# Patient Record
Sex: Female | Born: 1950 | Race: White | Hispanic: No | Marital: Married | Smoking: Former smoker
Health system: Southern US, Community
[De-identification: ages and names within clinical notes are randomized; demographics above are authoritative.]

## PROBLEM LIST (undated history)

## (undated) DIAGNOSIS — F319 Bipolar disorder, unspecified: Secondary | ICD-10-CM

## (undated) DIAGNOSIS — I619 Nontraumatic intracerebral hemorrhage, unspecified: Secondary | ICD-10-CM

## (undated) DIAGNOSIS — J439 Emphysema, unspecified: Secondary | ICD-10-CM

## (undated) DIAGNOSIS — F419 Anxiety disorder, unspecified: Secondary | ICD-10-CM

## (undated) DIAGNOSIS — C50919 Malignant neoplasm of unspecified site of unspecified female breast: Secondary | ICD-10-CM

## (undated) DIAGNOSIS — E119 Type 2 diabetes mellitus without complications: Secondary | ICD-10-CM

## (undated) DIAGNOSIS — J40 Bronchitis, not specified as acute or chronic: Secondary | ICD-10-CM

## (undated) DIAGNOSIS — I10 Essential (primary) hypertension: Secondary | ICD-10-CM

## (undated) DIAGNOSIS — K118 Other diseases of salivary glands: Secondary | ICD-10-CM

## (undated) DIAGNOSIS — C3490 Malignant neoplasm of unspecified part of unspecified bronchus or lung: Secondary | ICD-10-CM

## (undated) DIAGNOSIS — M5030 Other cervical disc degeneration, unspecified cervical region: Secondary | ICD-10-CM

## (undated) DIAGNOSIS — K279 Peptic ulcer, site unspecified, unspecified as acute or chronic, without hemorrhage or perforation: Secondary | ICD-10-CM

## (undated) DIAGNOSIS — I2510 Atherosclerotic heart disease of native coronary artery without angina pectoris: Secondary | ICD-10-CM

## (undated) DIAGNOSIS — R251 Tremor, unspecified: Secondary | ICD-10-CM

## (undated) HISTORY — PX: KNEE ARTHROSCOPY: SUR90

## (undated) HISTORY — PX: CORONARY ANGIOPLASTY WITH STENT PLACEMENT: SHX49

## (undated) HISTORY — PX: BREAST LUMPECTOMY: SHX2

---

## 2010-03-26 ENCOUNTER — Emergency Department (HOSPITAL_BASED_OUTPATIENT_CLINIC_OR_DEPARTMENT_OTHER)
Admission: EM | Admit: 2010-03-26 | Discharge: 2010-03-26 | Payer: Self-pay | Source: Home / Self Care | Admitting: Emergency Medicine

## 2010-06-09 LAB — DIFFERENTIAL
Basophils Relative: 0 % (ref 0–1)
Eosinophils Absolute: 0.2 10*3/uL (ref 0.0–0.7)
Eosinophils Relative: 2 % (ref 0–5)
Lymphs Abs: 2.3 10*3/uL (ref 0.7–4.0)
Monocytes Absolute: 0.8 10*3/uL (ref 0.1–1.0)
Monocytes Relative: 9 % (ref 3–12)

## 2010-06-09 LAB — BASIC METABOLIC PANEL
CO2: 23 mEq/L (ref 19–32)
Calcium: 9.9 mg/dL (ref 8.4–10.5)
Creatinine, Ser: 0.6 mg/dL (ref 0.4–1.2)
Sodium: 143 mEq/L (ref 135–145)

## 2010-06-09 LAB — URINALYSIS, ROUTINE W REFLEX MICROSCOPIC
Bilirubin Urine: NEGATIVE
Ketones, ur: NEGATIVE mg/dL
pH: 5.5 (ref 5.0–8.0)

## 2010-06-09 LAB — CBC
HCT: 48.6 % — ABNORMAL HIGH (ref 36.0–46.0)
Hemoglobin: 16.2 g/dL — ABNORMAL HIGH (ref 12.0–15.0)
MCH: 28.5 pg (ref 26.0–34.0)
MCV: 85.6 fL (ref 78.0–100.0)
Platelets: 367 10*3/uL (ref 150–400)
RBC: 5.68 MIL/uL — ABNORMAL HIGH (ref 3.87–5.11)

## 2010-06-09 LAB — POCT B-TYPE NATRIURETIC PEPTIDE (BNP): B Natriuretic Peptide, POC: 5.9 pg/mL (ref 0–100)

## 2010-06-09 LAB — POCT CARDIAC MARKERS
CKMB, poc: <1 ng/mL — ABNORMAL LOW (ref 1.0–8.0)
Myoglobin, poc: 69.8 ng/mL (ref 12–200)

## 2010-06-09 LAB — URINE MICROSCOPIC-ADD ON

## 2010-07-17 ENCOUNTER — Emergency Department (HOSPITAL_BASED_OUTPATIENT_CLINIC_OR_DEPARTMENT_OTHER)
Admission: EM | Admit: 2010-07-17 | Discharge: 2010-07-17 | Disposition: A | Discharge status: Home / Self Care | Payer: Federal, State, Local not specified - PPO | Attending: Emergency Medicine | Admitting: Emergency Medicine

## 2010-07-17 DIAGNOSIS — H81399 Other peripheral vertigo, unspecified ear: Secondary | ICD-10-CM | POA: Insufficient documentation

## 2010-07-17 DIAGNOSIS — Z79899 Other long term (current) drug therapy: Secondary | ICD-10-CM | POA: Insufficient documentation

## 2010-07-17 DIAGNOSIS — J45909 Unspecified asthma, uncomplicated: Secondary | ICD-10-CM | POA: Insufficient documentation

## 2010-07-17 DIAGNOSIS — I10 Essential (primary) hypertension: Secondary | ICD-10-CM | POA: Insufficient documentation

## 2011-08-23 IMAGING — CR DG CHEST 2V
2 series · 2 of 2 positions shown · non-contrast
Comparison: None.

CLINICAL DATA: Cough, shortness of breath, COPD, smoker

CHEST - 2 VIEW

[w chest pa]
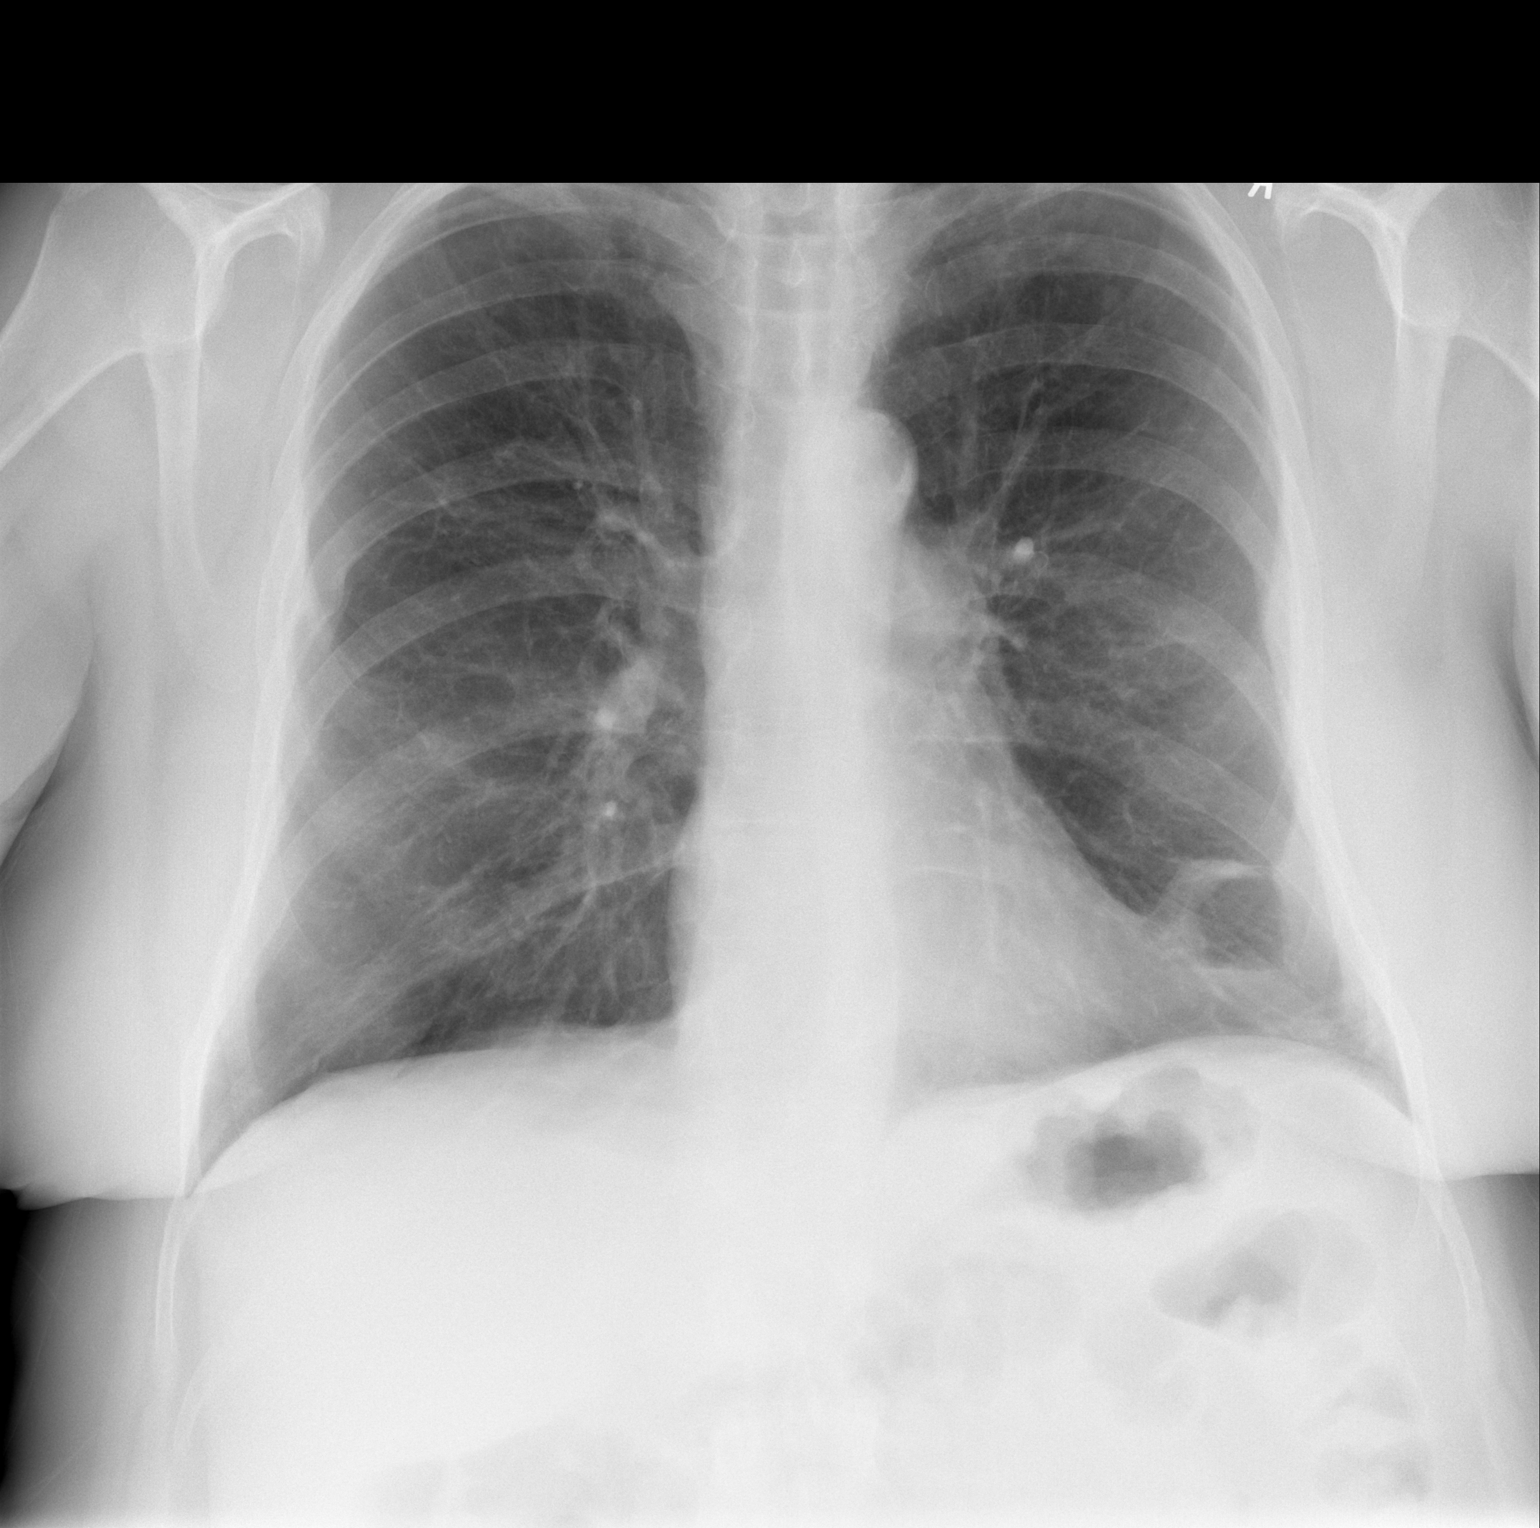

[w chest lat]
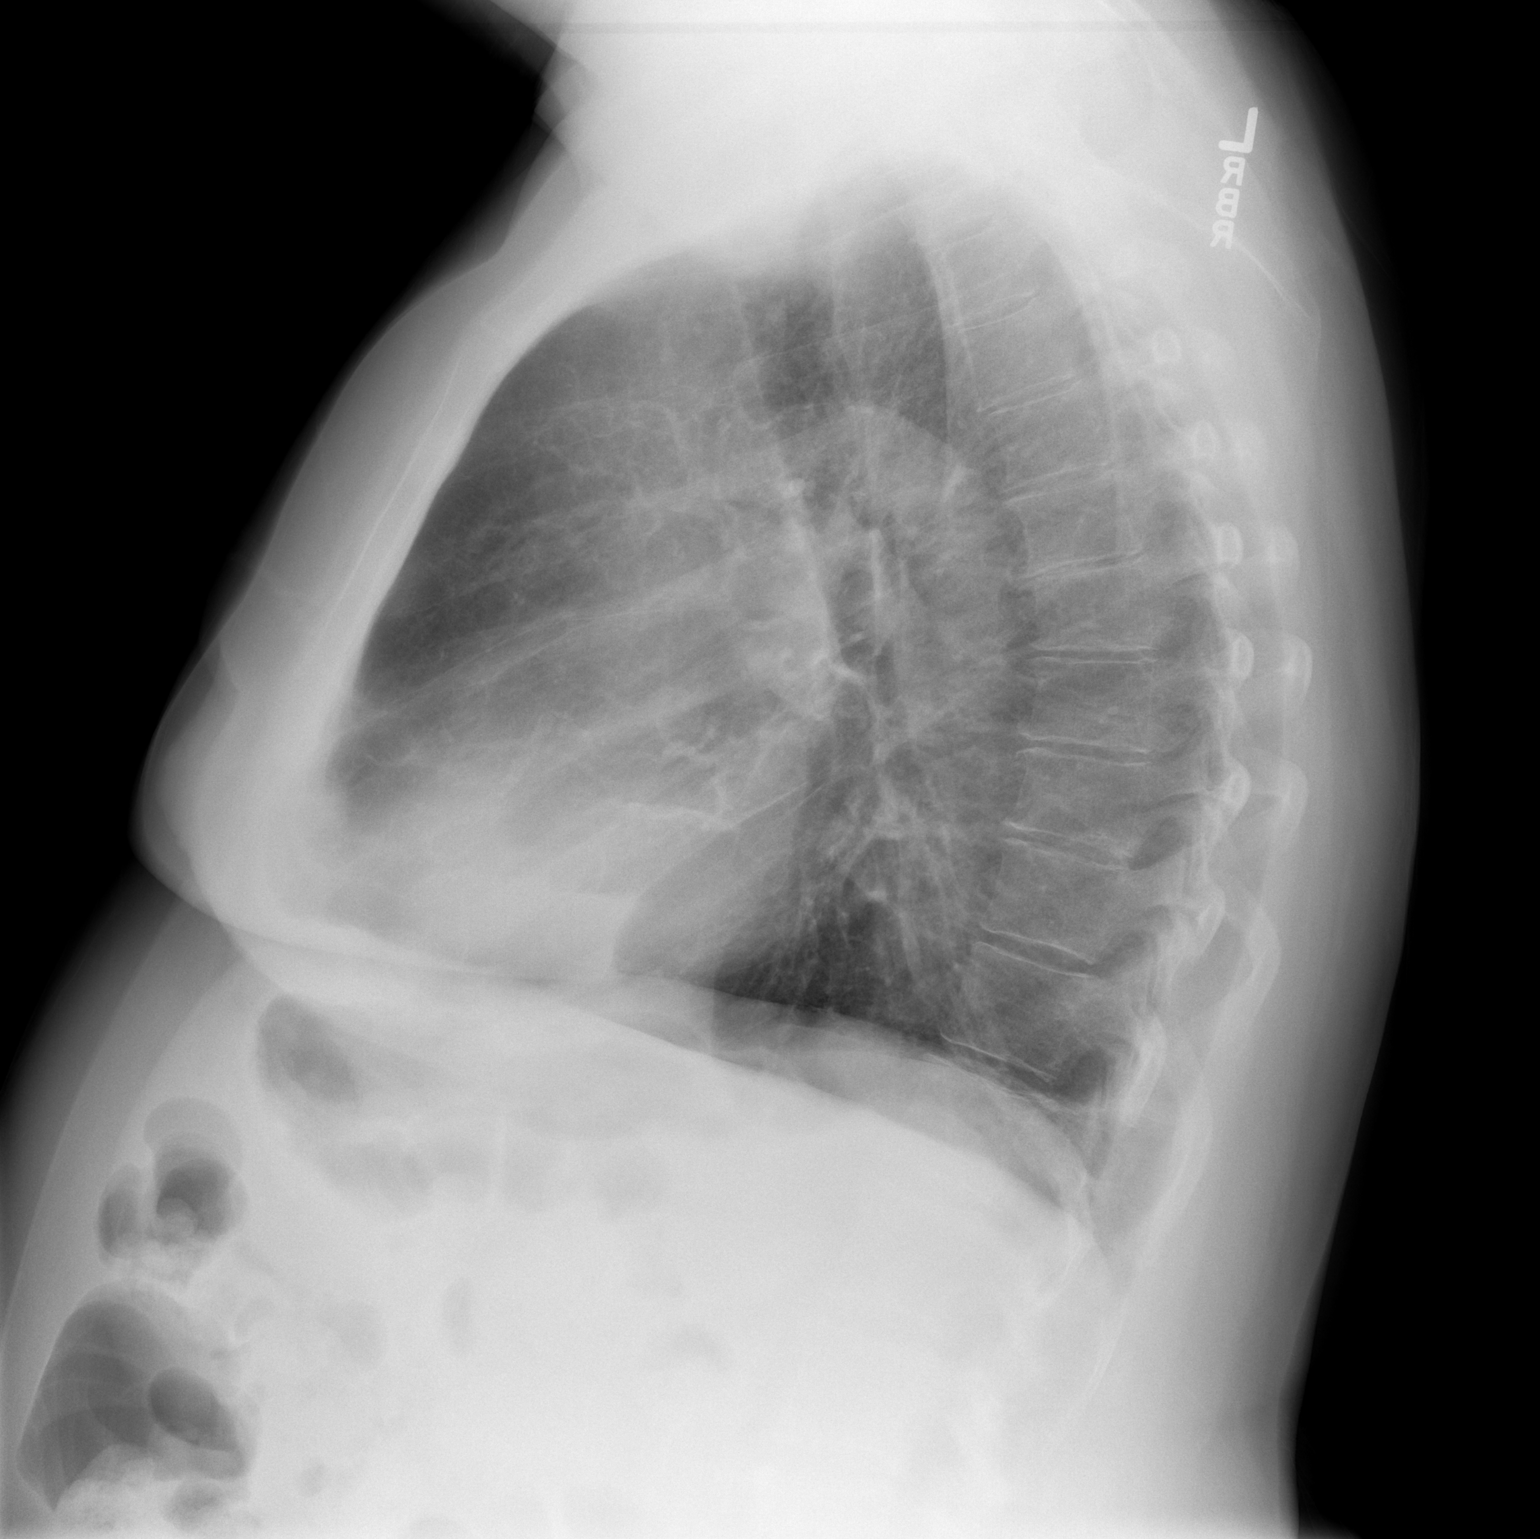

[2 of 2 positions shown; findings below may reference images not displayed]

FINDINGS: There is lingular opacity and small left pleural
effusion.  The right lung is clear.  The cardiac silhouette,
mediastinum, pulmonary vasculature are within normal limits.  Old
right rib fractures are noted.
IMPRESSION: Lingular infiltrate and effusion.

## 2014-03-30 HISTORY — PX: GASTRIC BYPASS: SHX52

## 2019-05-02 ENCOUNTER — Ambulatory Visit: Payer: TRICARE For Life (TFL) | Attending: Internal Medicine

## 2021-07-28 HISTORY — PX: REPAIR OF PERFORATED ULCER: SHX6065

## 2021-12-20 ENCOUNTER — Emergency Department (HOSPITAL_COMMUNITY): Payer: Medicare Other

## 2021-12-20 ENCOUNTER — Encounter (HOSPITAL_COMMUNITY): Payer: Self-pay

## 2021-12-20 ENCOUNTER — Other Ambulatory Visit: Payer: Self-pay

## 2021-12-20 ENCOUNTER — Inpatient Hospital Stay (HOSPITAL_COMMUNITY)
Admission: EM | Admit: 2021-12-20 | Discharge: 2021-12-25 | DRG: 064 | Disposition: A | Discharge status: Rehabilitation Facility | Payer: Medicare Other | Attending: Family Medicine | Admitting: Family Medicine

## 2021-12-20 ENCOUNTER — Inpatient Hospital Stay (HOSPITAL_COMMUNITY): Payer: Medicare Other

## 2021-12-20 DIAGNOSIS — K255 Chronic or unspecified gastric ulcer with perforation: Secondary | ICD-10-CM | POA: Diagnosis present

## 2021-12-20 DIAGNOSIS — Z923 Personal history of irradiation: Secondary | ICD-10-CM | POA: Diagnosis not present

## 2021-12-20 DIAGNOSIS — Z8249 Family history of ischemic heart disease and other diseases of the circulatory system: Secondary | ICD-10-CM

## 2021-12-20 DIAGNOSIS — R251 Tremor, unspecified: Secondary | ICD-10-CM | POA: Diagnosis present

## 2021-12-20 DIAGNOSIS — I69354 Hemiplegia and hemiparesis following cerebral infarction affecting left non-dominant side: Secondary | ICD-10-CM | POA: Diagnosis present

## 2021-12-20 DIAGNOSIS — R1310 Dysphagia, unspecified: Secondary | ICD-10-CM | POA: Diagnosis present

## 2021-12-20 DIAGNOSIS — E7800 Pure hypercholesterolemia, unspecified: Secondary | ICD-10-CM | POA: Diagnosis present

## 2021-12-20 DIAGNOSIS — Z681 Body mass index (BMI) 19 or less, adult: Secondary | ICD-10-CM

## 2021-12-20 DIAGNOSIS — I10 Essential (primary) hypertension: Secondary | ICD-10-CM | POA: Diagnosis present

## 2021-12-20 DIAGNOSIS — Z91040 Latex allergy status: Secondary | ICD-10-CM

## 2021-12-20 DIAGNOSIS — G4733 Obstructive sleep apnea (adult) (pediatric): Secondary | ICD-10-CM | POA: Diagnosis present

## 2021-12-20 DIAGNOSIS — Z9884 Bariatric surgery status: Secondary | ICD-10-CM | POA: Diagnosis not present

## 2021-12-20 DIAGNOSIS — G8194 Hemiplegia, unspecified affecting left nondominant side: Secondary | ICD-10-CM | POA: Diagnosis present

## 2021-12-20 DIAGNOSIS — C3412 Malignant neoplasm of upper lobe, left bronchus or lung: Secondary | ICD-10-CM | POA: Diagnosis present

## 2021-12-20 DIAGNOSIS — R296 Repeated falls: Secondary | ICD-10-CM | POA: Diagnosis present

## 2021-12-20 DIAGNOSIS — R54 Age-related physical debility: Secondary | ICD-10-CM | POA: Diagnosis present

## 2021-12-20 DIAGNOSIS — R270 Ataxia, unspecified: Secondary | ICD-10-CM | POA: Diagnosis present

## 2021-12-20 DIAGNOSIS — W19XXXA Unspecified fall, initial encounter: Secondary | ICD-10-CM | POA: Diagnosis not present

## 2021-12-20 DIAGNOSIS — Z833 Family history of diabetes mellitus: Secondary | ICD-10-CM

## 2021-12-20 DIAGNOSIS — D75839 Thrombocytosis, unspecified: Secondary | ICD-10-CM | POA: Diagnosis present

## 2021-12-20 DIAGNOSIS — I63511 Cerebral infarction due to unspecified occlusion or stenosis of right middle cerebral artery: Principal | ICD-10-CM | POA: Diagnosis present

## 2021-12-20 DIAGNOSIS — I69141 Monoplegia of lower limb following nontraumatic intracerebral hemorrhage affecting right dominant side: Secondary | ICD-10-CM | POA: Diagnosis not present

## 2021-12-20 DIAGNOSIS — Z888 Allergy status to other drugs, medicaments and biological substances status: Secondary | ICD-10-CM

## 2021-12-20 DIAGNOSIS — G9389 Other specified disorders of brain: Secondary | ICD-10-CM | POA: Diagnosis present

## 2021-12-20 DIAGNOSIS — F319 Bipolar disorder, unspecified: Secondary | ICD-10-CM | POA: Diagnosis present

## 2021-12-20 DIAGNOSIS — G936 Cerebral edema: Secondary | ICD-10-CM | POA: Diagnosis present

## 2021-12-20 DIAGNOSIS — E119 Type 2 diabetes mellitus without complications: Secondary | ICD-10-CM | POA: Diagnosis present

## 2021-12-20 DIAGNOSIS — F909 Attention-deficit hyperactivity disorder, unspecified type: Secondary | ICD-10-CM | POA: Diagnosis present

## 2021-12-20 DIAGNOSIS — I2510 Atherosclerotic heart disease of native coronary artery without angina pectoris: Secondary | ICD-10-CM | POA: Diagnosis present

## 2021-12-20 DIAGNOSIS — Z853 Personal history of malignant neoplasm of breast: Secondary | ICD-10-CM

## 2021-12-20 DIAGNOSIS — R29810 Facial weakness: Secondary | ICD-10-CM | POA: Diagnosis present

## 2021-12-20 DIAGNOSIS — J439 Emphysema, unspecified: Secondary | ICD-10-CM | POA: Diagnosis present

## 2021-12-20 DIAGNOSIS — F419 Anxiety disorder, unspecified: Secondary | ICD-10-CM | POA: Diagnosis present

## 2021-12-20 DIAGNOSIS — R64 Cachexia: Secondary | ICD-10-CM | POA: Diagnosis present

## 2021-12-20 DIAGNOSIS — G935 Compression of brain: Secondary | ICD-10-CM | POA: Diagnosis present

## 2021-12-20 DIAGNOSIS — U071 COVID-19: Secondary | ICD-10-CM | POA: Diagnosis present

## 2021-12-20 DIAGNOSIS — I63311 Cerebral infarction due to thrombosis of right middle cerebral artery: Secondary | ICD-10-CM | POA: Diagnosis not present

## 2021-12-20 DIAGNOSIS — I639 Cerebral infarction, unspecified: Secondary | ICD-10-CM | POA: Diagnosis present

## 2021-12-20 DIAGNOSIS — J3489 Other specified disorders of nose and nasal sinuses: Secondary | ICD-10-CM | POA: Diagnosis not present

## 2021-12-20 DIAGNOSIS — M5030 Other cervical disc degeneration, unspecified cervical region: Secondary | ICD-10-CM | POA: Diagnosis present

## 2021-12-20 DIAGNOSIS — Z955 Presence of coronary angioplasty implant and graft: Secondary | ICD-10-CM | POA: Diagnosis not present

## 2021-12-20 DIAGNOSIS — Z85118 Personal history of other malignant neoplasm of bronchus and lung: Secondary | ICD-10-CM

## 2021-12-20 DIAGNOSIS — Z79899 Other long term (current) drug therapy: Secondary | ICD-10-CM

## 2021-12-20 DIAGNOSIS — F411 Generalized anxiety disorder: Secondary | ICD-10-CM | POA: Diagnosis present

## 2021-12-20 DIAGNOSIS — R471 Dysarthria and anarthria: Secondary | ICD-10-CM | POA: Diagnosis present

## 2021-12-20 DIAGNOSIS — F17210 Nicotine dependence, cigarettes, uncomplicated: Secondary | ICD-10-CM | POA: Diagnosis present

## 2021-12-20 DIAGNOSIS — W06XXXA Fall from bed, initial encounter: Secondary | ICD-10-CM | POA: Diagnosis present

## 2021-12-20 DIAGNOSIS — R29711 NIHSS score 11: Secondary | ICD-10-CM | POA: Diagnosis present

## 2021-12-20 DIAGNOSIS — Z7982 Long term (current) use of aspirin: Secondary | ICD-10-CM

## 2021-12-20 DIAGNOSIS — I6389 Other cerebral infarction: Secondary | ICD-10-CM | POA: Diagnosis not present

## 2021-12-20 DIAGNOSIS — I25810 Atherosclerosis of coronary artery bypass graft(s) without angina pectoris: Secondary | ICD-10-CM | POA: Diagnosis not present

## 2021-12-20 DIAGNOSIS — H518 Other specified disorders of binocular movement: Secondary | ICD-10-CM | POA: Diagnosis present

## 2021-12-20 DIAGNOSIS — R590 Localized enlarged lymph nodes: Secondary | ICD-10-CM | POA: Diagnosis present

## 2021-12-20 DIAGNOSIS — R58 Hemorrhage, not elsewhere classified: Secondary | ICD-10-CM | POA: Diagnosis present

## 2021-12-20 DIAGNOSIS — K219 Gastro-esophageal reflux disease without esophagitis: Secondary | ICD-10-CM | POA: Diagnosis present

## 2021-12-20 DIAGNOSIS — F900 Attention-deficit hyperactivity disorder, predominantly inattentive type: Secondary | ICD-10-CM | POA: Diagnosis present

## 2021-12-20 DIAGNOSIS — R414 Neurologic neglect syndrome: Secondary | ICD-10-CM | POA: Diagnosis present

## 2021-12-20 DIAGNOSIS — R531 Weakness: Secondary | ICD-10-CM

## 2021-12-20 DIAGNOSIS — Z8659 Personal history of other mental and behavioral disorders: Secondary | ICD-10-CM | POA: Diagnosis not present

## 2021-12-20 HISTORY — DX: Malignant neoplasm of unspecified site of unspecified female breast: C50.919

## 2021-12-20 HISTORY — DX: Tremor, unspecified: R25.1

## 2021-12-20 HISTORY — DX: Malignant neoplasm of unspecified part of unspecified bronchus or lung: C34.90

## 2021-12-20 HISTORY — DX: Emphysema, unspecified: J43.9

## 2021-12-20 HISTORY — DX: Type 2 diabetes mellitus without complications: E11.9

## 2021-12-20 HISTORY — DX: Essential (primary) hypertension: I10

## 2021-12-20 HISTORY — DX: Bipolar disorder, unspecified: F31.9

## 2021-12-20 LAB — I-STAT CHEM 8, ED
BUN: 8 mg/dL (ref 8–23)
Calcium, Ion: 0.97 mmol/L — ABNORMAL LOW (ref 1.15–1.40)
Chloride: 106 mmol/L (ref 98–111)
Creatinine, Ser: 0.5 mg/dL (ref 0.44–1.00)
Glucose, Bld: 95 mg/dL (ref 70–99)
HCT: 45 % (ref 36.0–46.0)
Hemoglobin: 15.3 g/dL — ABNORMAL HIGH (ref 12.0–15.0)
Potassium: 3.9 mmol/L (ref 3.5–5.1)
Sodium: 140 mmol/L (ref 135–145)
TCO2: 28 mmol/L (ref 22–32)

## 2021-12-20 LAB — HEMOGLOBIN A1C
Hgb A1c MFr Bld: 5.8 % — ABNORMAL HIGH (ref 4.8–5.6)
Mean Plasma Glucose: 119.76 mg/dL

## 2021-12-20 LAB — COMPREHENSIVE METABOLIC PANEL
ALT: 23 U/L (ref 0–44)
AST: 26 U/L (ref 15–41)
Albumin: 3.1 g/dL — ABNORMAL LOW (ref 3.5–5.0)
Alkaline Phosphatase: 84 U/L (ref 38–126)
Anion gap: 9 (ref 5–15)
BUN: 6 mg/dL — ABNORMAL LOW (ref 8–23)
CO2: 25 mmol/L (ref 22–32)
Calcium: 8.7 mg/dL — ABNORMAL LOW (ref 8.9–10.3)
Chloride: 107 mmol/L (ref 98–111)
Creatinine, Ser: 0.54 mg/dL (ref 0.44–1.00)
GFR, Estimated: >60 mL/min (ref >60)
Glucose, Bld: 91 mg/dL (ref 70–99)
Potassium: 4 mmol/L (ref 3.5–5.1)
Sodium: 141 mmol/L (ref 135–145)
Total Bilirubin: 0.8 mg/dL (ref 0.3–1.2)
Total Protein: 6.2 g/dL — ABNORMAL LOW (ref 6.5–8.1)

## 2021-12-20 LAB — DIFFERENTIAL
Abs Immature Granulocytes: 0.02 10*3/uL (ref 0.00–0.07)
Basophils Absolute: 0 10*3/uL (ref 0.0–0.1)
Basophils Relative: 0 %
Eosinophils Absolute: 0 10*3/uL (ref 0.0–0.5)
Eosinophils Relative: 1 %
Immature Granulocytes: 0 %
Lymphocytes Relative: 27 %
Lymphs Abs: 1.7 10*3/uL (ref 0.7–4.0)
Monocytes Absolute: 0.5 10*3/uL (ref 0.1–1.0)
Monocytes Relative: 8 %
Neutro Abs: 4.2 10*3/uL (ref 1.7–7.7)
Neutrophils Relative %: 64 %

## 2021-12-20 LAB — ECHOCARDIOGRAM COMPLETE
Area-P 1/2: 3.48 cm2
Calc EF: 51 %
Height: 60 in
S' Lateral: 2.8 cm
Single Plane A2C EF: 48.1 %
Single Plane A4C EF: 51.3 %
Weight: 1552.04 oz

## 2021-12-20 LAB — APTT: aPTT: 26 seconds (ref 24–36)

## 2021-12-20 LAB — PROTIME-INR
INR: 0.9 (ref 0.8–1.2)
Prothrombin Time: 12.5 seconds (ref 11.4–15.2)

## 2021-12-20 LAB — CBC
HCT: 45.1 % (ref 36.0–46.0)
Hemoglobin: 14.6 g/dL (ref 12.0–15.0)
MCH: 29.1 pg (ref 26.0–34.0)
MCHC: 32.4 g/dL (ref 30.0–36.0)
MCV: 89.8 fL (ref 80.0–100.0)
Platelets: 258 10*3/uL (ref 150–400)
RBC: 5.02 MIL/uL (ref 3.87–5.11)
RDW: 14 % (ref 11.5–15.5)
WBC: 6.5 10*3/uL (ref 4.0–10.5)
nRBC: 0 % (ref 0.0–0.2)

## 2021-12-20 LAB — ETHANOL: Alcohol, Ethyl (B): <10 mg/dL (ref <10)

## 2021-12-20 LAB — SODIUM
Sodium: 144 mmol/L (ref 135–145)
Sodium: 142 mmol/L (ref 135–145)

## 2021-12-20 LAB — CBG MONITORING, ED: Glucose-Capillary: 106 mg/dL — ABNORMAL HIGH (ref 70–99)

## 2021-12-20 LAB — HIV ANTIBODY (ROUTINE TESTING W REFLEX): HIV Screen 4th Generation wRfx: NONREACTIVE

## 2021-12-20 MED ORDER — ACETAMINOPHEN 325 MG PO TABS
650.0000 mg | ORAL_TABLET | ORAL | Status: DC | PRN
  Administered 2021-12-21 – 2021-12-25 (×8): 650 mg via ORAL
  Filled 2021-12-20 (×9): qty 2

## 2021-12-20 MED ORDER — PANTOPRAZOLE SODIUM 40 MG IV SOLR
40.0000 mg | Freq: Every day | INTRAVENOUS | Status: DC
  Administered 2021-12-20 – 2021-12-21 (×2): 40 mg via INTRAVENOUS
  Filled 2021-12-20: qty 10

## 2021-12-20 MED ORDER — STROKE: EARLY STAGES OF RECOVERY BOOK
Freq: Once | Status: AC
  Filled 2021-12-20: qty 1

## 2021-12-20 MED ORDER — REVEFENACIN 175 MCG/3ML IN SOLN
175.0000 ug | Freq: Every day | RESPIRATORY_TRACT | Status: DC
  Administered 2021-12-21 – 2021-12-25 (×5): 175 ug via RESPIRATORY_TRACT
  Filled 2021-12-20 (×6): qty 3

## 2021-12-20 MED ORDER — ORAL CARE MOUTH RINSE: 15.0000 mL | OROMUCOSAL | Status: DC | PRN

## 2021-12-20 MED ORDER — CHLORHEXIDINE GLUCONATE CLOTH 2 % EX PADS
6.0000 | MEDICATED_PAD | Freq: Every day | CUTANEOUS | Status: DC
  Administered 2021-12-20 – 2021-12-25 (×6): 6 via TOPICAL

## 2021-12-20 MED ORDER — SODIUM CHLORIDE 3 % IV SOLN
INTRAVENOUS | Status: AC
  Filled 2021-12-20 (×3): qty 500

## 2021-12-20 MED ORDER — SENNOSIDES-DOCUSATE SODIUM 8.6-50 MG PO TABS: 1.0000 | ORAL_TABLET | Freq: Every evening | ORAL | Status: DC | PRN

## 2021-12-20 MED ORDER — SODIUM CHLORIDE 0.9% FLUSH: 3.0000 mL | Freq: Once | INTRAVENOUS | Status: DC

## 2021-12-20 MED ORDER — ACETAMINOPHEN 160 MG/5ML PO SOLN: 650.0000 mg | ORAL | Status: DC | PRN

## 2021-12-20 MED ORDER — ARFORMOTEROL TARTRATE 15 MCG/2ML IN NEBU
15.0000 ug | INHALATION_SOLUTION | Freq: Two times a day (BID) | RESPIRATORY_TRACT | Status: DC
  Administered 2021-12-20 – 2021-12-25 (×10): 15 ug via RESPIRATORY_TRACT
  Filled 2021-12-20 (×10): qty 2

## 2021-12-20 MED ORDER — ACETAMINOPHEN 650 MG RE SUPP
650.0000 mg | RECTAL | Status: DC | PRN
  Administered 2021-12-20: 650 mg via RECTAL
  Filled 2021-12-20: qty 1

## 2021-12-20 MED ORDER — IOHEXOL 350 MG/ML SOLN
75.0000 mL | Freq: Once | INTRAVENOUS | Status: AC | PRN
  Administered 2021-12-20: 75 mL via INTRAVENOUS

## 2021-12-20 NOTE — Progress Notes (Signed)
Patient tested positive for Covid per home test on Sunday 12/14/21. Patient/family does not have copy of positive result.    Husband was sick prior to patient.

## 2021-12-20 NOTE — ED Provider Notes (Signed)
[facility] EMERGENCY DEPARTMENT Provider Note  CSN: [id number] Arrival date & time: 12/20/21 0751  Chief Complaint(s) Code Stroke  HPI Lauren [name] is a 71 y.o. female with history of diabetes, hypertension presenting to the emergency department with weakness.  Per EMS, patient had went to sleep normal last night at 8 PM.  This morning, patient woke up around 7 AM and tried to get out of bed and fell, collapsing as she could not move her left side.  EMS was called, brought the patient to the emergency department and activated a code stroke.  Patient denies any pain but reports she cannot move her left side.  Denies any other recent symptoms such as chest pain, abdominal pain.  Was placed in a c-collar by EMS given fall.   Past Medical History Past Medical History:  Diagnosis Date   Diabetes mellitus without complication ([place])    Hypertension    Patient Active Problem List   Diagnosis Date Noted   Stroke ([place]) 12/20/2021   Home Medication(s) Prior to Admission medications   Not on File                                                                                                                                    Past Surgical History History reviewed. No pertinent surgical history. Family History No family history on file.  Social History   Current smoker Allergies Fluoxetine and Latex  Review of Systems Review of Systems  Unable to perform ROS: Acuity of condition    Physical Exam Vital Signs  I have reviewed the triage vital signs BP 122/85 Comment: 2L [place]  Pulse 88 Comment: 2L [place]  Temp 98 F (36.7 C) (Oral)   Resp (!) 28 Comment: 2L [place]  Ht 5' (1.524 m)   Wt 44 kg   SpO2 95% Comment: 2L [place]  BMI 18.94 kg/m  Physical Exam Vitals and nursing note reviewed.  Constitutional:      General: She is not in acute distress.    Appearance: She is well-developed.  HENT:     Head: Normocephalic and atraumatic.     Mouth/Throat:     Mouth: Mucous  membranes are moist.     Comments: Dried blood around mouth, no obvious oral lesion Eyes:     Pupils: Pupils are equal, round, and reactive to light.  Neck:     Comments: Collar in place Cardiovascular:     Rate and Rhythm: Normal rate and regular rhythm.     Heart sounds: No murmur heard. Pulmonary:     Effort: Pulmonary effort is normal. No respiratory distress.     Breath sounds: Normal breath sounds.  Abdominal:     General: Abdomen is flat.     Palpations: Abdomen is soft.     Tenderness: There is no abdominal tenderness.  Musculoskeletal:        General: No tenderness.  Right lower leg: No edema.     Left lower leg: No edema.  Skin:    General: Skin is warm and dry.  Neurological:     Mental Status: She is alert.     Comments: Left-sided facial droop with rightward gaze preference, does not blink to confrontation and left visual field.  Slurred speech.  Strength 2 out of 5 in the left upper extremity and 1 out of 5 in the left lower extremity.  Strength normal in the right upper and lower extremity.  Psychiatric:        Mood and Affect: Mood normal.        Behavior: Behavior normal.     ED Results and Treatments Labs (all labs ordered are listed, but only abnormal results are displayed) Labs Reviewed  COMPREHENSIVE METABOLIC PANEL - Abnormal; Notable for the following components:      Result Value   BUN 6 (*)    Calcium 8.7 (*)    Total Protein 6.2 (*)    Albumin 3.1 (*)    All other components within normal limits  I-STAT CHEM 8, ED - Abnormal; Notable for the following components:   Calcium, Ion 0.97 (*)    Hemoglobin 15.3 (*)    All other components within normal limits  CBG MONITORING, ED - Abnormal; Notable for the following components:   Glucose-Capillary 106 (*)    All other components within normal limits  PROTIME-INR  APTT  CBC  DIFFERENTIAL  ETHANOL  SODIUM  SODIUM  SODIUM  HIV ANTIBODY (ROUTINE TESTING W REFLEX)  HEMOGLOBIN A1C                                                                                                                           Radiology CT Cervical Spine Wo Contrast  Result Date: 12/20/2021 CLINICAL DATA:  71 year old female code stroke presentation. EXAM: CT CERVICAL SPINE WITH CONTRAST TECHNIQUE: Multiplanar CT images of the cervical spine were reconstructed from contemporary CTA of the Neck. RADIATION DOSE REDUCTION: This exam was performed according to the departmental dose-optimization program which includes automated exposure control, adjustment of the mA and/or kV according to patient size and/or use of iterative reconstruction technique. CONTRAST:  No additional COMPARISON:  CTA head and neck today reported separately. Cervical spine CT 11/17/2018. FINDINGS: Alignment: Chronic straightening of lower cervical lordosis. Cervicothoracic junction alignment is within normal limits. Bilateral posterior element alignment is within normal limits. Skull base and vertebrae: Visualized skull base is intact. No atlanto-occipital dissociation. Healed left anterior C1 ring fracture since 2020. Maintained C1-C2 alignment. Previously seen left C6 and C7 superior articulating facet fractures have healed (series 3, image 64). No acute osseous abnormality identified. Soft tissues and spinal canal: No prevertebral fluid or swelling. No visible canal hematoma. Neck CTA findings reported separately. Disc levels: Intermittent cervical disc and endplate degeneration appears stable since 2020. Upper chest: Reported separately. IMPRESSION: 1. No acute osseous abnormality identified in the cervical spine. Healed left  C1 ring, left C6 and C7 posterior element fractures since 2020. 2. CTA head and neck today reported separately. Electronically Signed   By: Genevie Ann M.D.   On: 12/20/2021 08:56   CT ANGIO HEAD NECK W WO CM W PERF (CODE STROKE)  Result Date: 12/20/2021 CLINICAL DATA:  Code stroke. 71 year old female. Known indolent left upper  lobe lung carcinoma. EXAM: CT ANGIOGRAPHY HEAD AND NECK CT PERFUSION BRAIN TECHNIQUE: Multidetector CT imaging of the head and neck was performed using the standard protocol during bolus administration of intravenous contrast. Multiplanar CT image reconstructions and MIPs were obtained to evaluate the vascular anatomy. Carotid stenosis measurements (when applicable) are obtained utilizing NASCET criteria, using the distal internal carotid diameter as the denominator. Multiphase CT imaging of the brain was performed following IV bolus contrast injection. Subsequent parametric perfusion maps were calculated using RAPID software. RADIATION DOSE REDUCTION: This exam was performed according to the departmental dose-optimization program which includes automated exposure control, adjustment of the mA and/or kV according to patient size and/or use of iterative reconstruction technique. CONTRAST:  71mL OMNIPAQUE IOHEXOL 350 MG/ML SOLN COMPARISON:  Plain head CT 0805 hours today Chest CT 07/30/2021. FINDINGS: CT Brain Perfusion Findings: ASPECTS: 5 CBF (<30%) Volume: 0 by the standard criteria, and no CBV alterations, suggesting subacute cytotoxic edema on the plain head CT. Perfusion (Tmax>6.0s) volume: 94mL by the standard criteria, up to 54 mL using T-max greater than 4 S. Mismatch Volume: Difficult to determine considering pseudonormalization of CBF and CBV. Suspect little salvageable penumbra given this constellation. Infarction Location:Right MCA CTA NECK Skeleton: Largely absent dentition. Mild mandible motion artifact. Mild cervical spine degeneration for age. No acute osseous abnormality identified. Upper chest: Emphysema and apical lung scarring. Known left upper lobe lung cancer is 12 mm on series 5, image 151, stable since 07/30/2021. Other neck: No acute finding. Aortic arch: Calcified aortic atherosclerosis. Entire arch not included. Right carotid system: Visible brachiocephalic artery with only mild plaque.  Negative right CCA origin. Mild right CCA calcified plaque without stenosis. Right ICA origin calcified plaque with up to 64 % stenosis with respect to the distal vessel. Additional right ICA bulb calcified plaque with lesser stenosis. Left carotid system: Left CCA origin not included. Mild left CCA plaque proximal to the bifurcation. Bulky calcified plaque at the left ICA origin. 50% stenosis at the left ICA origin. Less pronounced calcified plaque at the left ICA bulb. Vertebral arteries: Proximal right subclavian artery plaque without stenosis. Minimal plaque at the right vertebral artery origin without stenosis. Right vertebral artery is mildly dominant and patent to the skull base without stenosis. Bulky proximal left subclavian artery plaque with up to 72 % stenosis with respect to the distal vessel series 5, image 167. Plaque near the left vertebral artery origin but only mild origin stenosis. Left vertebral artery is mildly non dominant with additional V1 and V2 calcified plaque but remains patent to the skull base with no significant stenosis. CTA HEAD Posterior circulation: Mild bilateral distal vertebral artery calcified plaque without stenosis. Patent PICA origins and vertebrobasilar junction. Patent basilar artery without stenosis. Patent SCA and PCA origins. Posterior communicating arteries are diminutive or absent. Bilateral PCA branches are within normal limits. Anterior circulation: Both ICA siphons are patent with calcified plaque. Only mild bilateral siphon stenosis. Patent carotid termini, MCA and ACA origins. Anterior communicating artery and bilateral ACA branches are within normal limits. Left MCA M1 segment and bifurcation are patent without stenosis. Left MCA branches are within normal  limits. Right MCA origin is patent. The right M1 is occluded on series 12, image 23 about 10 mm from its origin proximal to the bifurcation. However, the bifurcation and MCA branches are moderately  reconstituted as seen on series 11, image 16. Venous sinuses: Grossly patent. Anatomic variants: Mildly dominant right vertebral artery. Review of the MIP images confirms the above findings Salient findings discussed by telephone with Dr. Kerney Elbe on 12/20/2021 at 0824 hours. IMPRESSION: 1. Positive for Emergent Large Vessel Occlusion of the Right MCA mid M1 segment, but pseudo-normalization of the Right MCA infarct on both CBF and CBV. Combined with ASPECTS of 5 and moderately reconstituted appearance of the right MCA branches this constellation does not seem favorable for endovascular reperfusion. These findings discussed by telephone with Dr. Kerney Elbe on 12/20/2021 at 0824 hours. 2. Known left upper lobe indolent lung cancer appears stable since chest CT 07/30/2021, 12 mm. Emphysema (ICD10-J43.9). 3. Aortic Atherosclerosis (ICD10-I70.0) and atherosclerosis in the neck and at the skull base. Significant stenoses: - Right ICA origin, 64%. - proximal Left Subclavian Artery, 72%. Electronically Signed   By: Genevie Ann M.D.   On: 12/20/2021 08:42   CT HEAD CODE STROKE WO CONTRAST  Result Date: 12/20/2021 CLINICAL DATA:  Code stroke.  71 year old female EXAM: CT HEAD WITHOUT CONTRAST TECHNIQUE: Contiguous axial images were obtained from the base of the skull through the vertex without intravenous contrast. RADIATION DOSE REDUCTION: This exam was performed according to the departmental dose-optimization program which includes automated exposure control, adjustment of the mA and/or kV according to patient size and/or use of iterative reconstruction technique. COMPARISON:  Brain MRI 03/20/2021. Head CT 09/17/2018. FINDINGS: Brain: Cytotoxic edema in the right MCA territory (series 2, image 22). No hemorrhagic transformation or midline shift. M1 and M2 segments affected. Right insula, lentiform and internal capsule affected. Elsewhere gray-white matter differentiation appears normal. Basilar cisterns are preserved.  No ventriculomegaly. Vascular: Calcified atherosclerosis at the skull base. Hyperdense right MCA M1 on series 5, image 21. Skull: No acute osseous abnormality identified. Sinuses/Orbits: Visualized paranasal sinuses and mastoids are stable and well aerated. Other: Rightward gaze. Visualized scalp soft tissues are within normal limits. ASPECTS [facility] Stroke Program Early CT Score) Total score (0-10 with 10 being normal): 5, as above IMPRESSION: 1. Positive for right MCA cytotoxic edema and hyperdense Right MCA compatible with ELVO. ASPECTS five. 2. No associated hemorrhage or midline shift. 3. These results were communicated to Dr. Cheral Marker at 8:22 am on 12/20/2021 by text page via the [facility] messaging system. Electronically Signed   By: Genevie Ann M.D.   On: 12/20/2021 08:23    Pertinent labs & imaging results that were available during my care of the patient were reviewed by me and considered in my medical decision making (see MDM for details).  Medications Ordered in ED Medications  sodium chloride flush (NS) 0.9 % injection 3 mL (3 mLs Intravenous Not Given 12/20/21 1028)  sodium chloride (hypertonic) 3 % solution ( Intravenous New Bag/Given 12/20/21 1016)   stroke: early stages of recovery book (has no administration in time range)  acetaminophen (TYLENOL) tablet 650 mg (has no administration in time range)    Or  acetaminophen (TYLENOL) 160 MG/5ML solution 650 mg (has no administration in time range)    Or  acetaminophen (TYLENOL) suppository 650 mg (has no administration in time range)  senna-docusate (Senokot-S) tablet 1 tablet (has no administration in time range)  iohexol (OMNIPAQUE) 350 MG/ML injection 75 mL (75 mLs Intravenous  Contrast Given 12/20/21 0817)                                                                                                                                     Procedures .Critical Care  Performed by: Cristie Hem, MD Authorized by: Cristie Hem, MD    Critical care provider statement:    Critical care time (minutes):  30   Critical care end time:  12/20/2021 8:44 AM   Critical care was necessary to treat or prevent imminent or life-threatening deterioration of the following conditions:  CNS failure or compromise, respiratory failure and circulatory failure   Critical care was time spent personally by me on the following activities:  Development of treatment plan with patient or surrogate, discussions with consultants, examination of patient, ordering and review of laboratory studies, ordering and review of radiographic studies, pulse oximetry, re-evaluation of patient's condition and review of old charts   Care discussed with: admitting provider     (including critical care time)  Medical Decision Making / ED Course   MDM:  71 year old female presenting with weakness   Exam concerning for large vessel occlusion ischemic stroke.  CT scan shows right-sided MCA hyperdensity concerning for large vessel occlusion stroke.  Discussed with neurologist, Dr. Cheral Marker and given the extent of her stroke and CT scan results thrombectomy was considered, but patient not a good candidate given very high risk of bleeding extent of ischemia.  He may start patient on hypertonic saline.  TNK also considered but patient outside of the window given last known well time was 8 PM.  Exam with some bleeding around oropharynx, no obvious oral lesion, no obvious other traumatic injury from fall.  Will clear c-collar after C-spine CT scan results. Clinical Course as of 12/20/21 1038  Sat Dec 20, 2021  0938 Dr. Cheral Marker recommends 3% saline at 50cc/hr. Orders placed. Paged hospitalist for admission.  Reassessed patient, c-collar removed.  No evidence of musculoskeletal injury, range of motion of the bilateral upper and lower extremities intact without focal tenderness.  Patient denies sensory deficits in the left upper extremity which could obscure traumatic injury.   Patient also with no midline C, T, L-spine tenderness on exam.  Removed some dried blood from mouth, appears to be from very small lip wound.  No signs of other traumatic injury from fall [WS]  1032 Discussed with the intensivist as hypertonic saline cannot be given on the floor.  They will come and see the patient. [WS]    Clinical Course User Index [WS] Cristie Hem, MD     Additional history obtained: -Additional history obtained from ems -External records from outside source obtained and reviewed including: Chart review including previous notes, labs, imaging, consultation notes   Lab Tests: -I ordered, reviewed, and interpreted labs.   The pertinent results include:   Labs Reviewed  COMPREHENSIVE METABOLIC PANEL - Abnormal; Notable for the following  components:      Result Value   BUN 6 (*)    Calcium 8.7 (*)    Total Protein 6.2 (*)    Albumin 3.1 (*)    All other components within normal limits  I-STAT CHEM 8, ED - Abnormal; Notable for the following components:   Calcium, Ion 0.97 (*)    Hemoglobin 15.3 (*)    All other components within normal limits  CBG MONITORING, ED - Abnormal; Notable for the following components:   Glucose-Capillary 106 (*)    All other components within normal limits  PROTIME-INR  APTT  CBC  DIFFERENTIAL  ETHANOL  SODIUM  SODIUM  SODIUM  HIV ANTIBODY (ROUTINE TESTING W REFLEX)  HEMOGLOBIN A1C      EKG   EKG Interpretation  Date/Time:  Saturday December 20 2021 09:25:37 EDT Ventricular Rate:  89 PR Interval:  155 QRS Duration: 96 QT Interval:  381 QTC Calculation: 464 R Axis:   50 Text Interpretation: Sinus rhythm Right atrial enlargement Borderline repolarization abnormality Confirmed by Garnette Gunner [phone]) on 12/20/2021 9:36:50 AM         Imaging Studies ordered: I ordered imaging studies including CT angiography head and neck, CT head, CT cervical spine On my interpretation imaging demonstrates large vessel  occlusion stroke I independently visualized and interpreted imaging. I agree with the radiologist interpretation   Medicines ordered and prescription drug management: Meds ordered this encounter  Medications   sodium chloride flush (NS) 0.9 % injection 3 mL   iohexol (OMNIPAQUE) 350 MG/ML injection 75 mL   sodium chloride (hypertonic) 3 % solution    stroke: early stages of recovery book   OR Linked Order Group    acetaminophen (TYLENOL) tablet 650 mg    acetaminophen (TYLENOL) 160 MG/5ML solution 650 mg    acetaminophen (TYLENOL) suppository 650 mg   senna-docusate (Senokot-S) tablet 1 tablet    -I have reviewed the patients home medicines and have made adjustments as needed   Consultations Obtained: I requested consultation with the neurologist,  and discussed lab and imaging findings as well as pertinent plan - they recommend: Admit for close monitoring, patient not eligible for intervention unfortunately   Cardiac Monitoring: The patient was maintained on a cardiac monitor.  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  Despite best efforts to proofread,  errors can occur which can change the documentation meaning.    Cristie Hem, MD 12/20/21 1039

## 2021-12-20 NOTE — Progress Notes (Signed)
PT Cancellation Note  Patient Details Name: Lauren [name] MRN: [id number] DOB: [birth year]   Cancelled Treatment:    Reason Eval/Treat Not Completed: Active bedrest order. PT will follow up when the pt is off bedrest and appropriate to evaluate.   Zenaida Niece 12/20/2021, 12:25 PM

## 2021-12-20 NOTE — Plan of Care (Signed)
  Problem: Fluid Volume: Goal: Ability to maintain a balanced intake and output will improve Outcome: Progressing   Problem: Tissue Perfusion: Goal: Adequacy of tissue perfusion will improve Outcome: Progressing   Problem: Education: Goal: Knowledge of disease or condition will improve Outcome: Progressing   Problem: Coping: Goal: Will identify appropriate support needs Outcome: Progressing   Problem: Self-Care: Goal: Ability to participate in self-care as condition permits will improve Outcome: Progressing Goal: Ability to communicate needs accurately will improve Outcome: Progressing

## 2021-12-20 NOTE — Evaluation (Signed)
Clinical/Bedside Swallow Evaluation Patient Details  Name: Lauren [name] MRN: [id number] Date of Birth: 05/31/1950  Today's Date: 12/20/2021 Time: SLP Start Time (ACUTE ONLY): 7341 SLP Stop Time (ACUTE ONLY): 1710 SLP Time Calculation (min) (ACUTE ONLY): 20 min  Past Medical History:  Past Medical History:  Diagnosis Date   Diabetes mellitus without complication ([place])    Hypertension    Past Surgical History: History reviewed. No pertinent surgical history. HPI:  Patient is a 71 y.o. female with PMH: CAD, HTN, DM-2, COPD, GERD, lung nodules, tobacco dependency, ADD, stomach ulcers, tremor disorder, prior hemorrhagic stroke, HLD, thrombocytosis. She presented to the hospital on 12/19/21 with left sided weakness, slurred speech and left sided facial droop. Imaging showed completed right M1 MCA stroke and she was out of window for intervention; some developing midline shift. She failed [facility] swallow with RN and SLP ordered for swallow evaluation.    Assessment / Plan / Recommendation  Clinical Impression  Patient presents with clinical s/s of dysphagia as per this bedside/clinical swallowing evaluation. In addition, patient reported (confirmed by husband) that she has a long history of coughing when eating and drinking. When asked if she had talked to her doctor about this she replied, "yes, nothing came of it". SLP observed patient with PO intake of thin liquids (water) via cup sips with SLP helping to hold cup as patient has a tremor of her right hand which disrupts control of cup, leading to patient getting too much liquid in her mouth. With first sip, she exhibited an immediate cough but with subsequent sips, no cough observed. Swallow initiation appeared timely but question if there is some level of disorganization during swallow. SLP recommending continue NPO but allow water PRN. SLP will f/u next date to determine if ready for PO's and if needing objective swallow study. SLP Visit Diagnosis:  Dysphagia, unspecified (R13.10)    Aspiration Risk  Mild aspiration risk;Moderate aspiration risk    Diet Recommendation NPO;Free water protocol after oral care;Ice chips PRN after oral care   Liquid Administration via: Cup Medication Administration: Via alternative means Supervision: Full supervision/cueing for compensatory strategies;Staff to assist with self feeding Postural Changes: Seated upright at 90 degrees    Other  Recommendations Oral Care Recommendations: Oral care BID;Staff/trained caregiver to provide oral care;Oral care prior to ice chip/H20    Recommendations for follow up therapy are one component of a multi-disciplinary discharge planning process, led by the attending physician.  Recommendations may be updated based on patient status, additional functional criteria and insurance authorization.  Follow up Recommendations Acute inpatient rehab (3hours/day)      Assistance Recommended at Discharge Frequent or constant Supervision/Assistance  Functional Status Assessment Patient has had a recent decline in their functional status and demonstrates the ability to make significant improvements in function in a reasonable and predictable amount of time.  Frequency and Duration min 2x/week  2 weeks       Prognosis Prognosis for Safe Diet Advancement: Good Barriers to Reach Goals: Severity of deficits      Swallow Study   General Date of Onset: 12/19/21 HPI: Patient is a 71 y.o. female with PMH: CAD, HTN, DM-2, COPD, GERD, lung nodules, tobacco dependency, ADD, stomach ulcers, tremor disorder, prior hemorrhagic stroke, HLD, thrombocytosis. She presented to the hospital on 12/19/21 with left sided weakness, slurred speech and left sided facial droop. Imaging showed completed right M1 MCA stroke and she was out of window for intervention; some developing midline shift. She failed [facility] swallow with  RN and SLP ordered for swallow evaluation. Type of Study: Bedside Swallow  Evaluation Previous Swallow Assessment: none found Diet Prior to this Study: NPO Temperature Spikes Noted: No Respiratory Status: Nasal cannula History of Recent Intubation: No Behavior/Cognition: Alert;Cooperative;Pleasant mood Oral Cavity Assessment: Within Functional Limits Oral Care Completed by SLP: Yes Oral Cavity - Dentition: Dentures, top;Dentures, bottom Self-Feeding Abilities: Needs set up;Needs assist Patient Positioning: Upright in bed Baseline Vocal Quality: Normal;Hoarse Volitional Cough: Strong Volitional Swallow: Able to elicit    Oral/Motor/Sensory Function Overall Oral Motor/Sensory Function: Moderate impairment Facial ROM: Reduced left;Suspected CN VII (facial) dysfunction Facial Symmetry: Abnormal symmetry left;Suspected CN VII (facial) dysfunction Facial Strength: Reduced left;Suspected CN VII (facial) dysfunction Facial Sensation: Reduced left;Suspected CN V (Trigeminal) dysfunction Lingual ROM: Within Functional Limits Lingual Symmetry: Within Functional Limits Lingual Strength: Within Functional Limits   Ice Chips     Thin Liquid Thin Liquid: Impaired Presentation: Cup Oral Phase Functional Implications: Other (comment) (tremor leading to poor coordination when bringing liquid to mouth) Pharyngeal  Phase Impairments: Cough - Immediate Other Comments: Patient with immediate cough after first sip but then no coughing or throat clearing for subsequent approximately 6-7 sips    Nectar Thick Nectar Thick Liquid: Not tested   Honey Thick Honey Thick Liquid: Not tested   Puree Puree: Not tested   Solid     Solid: Impaired Oral Phase Impairments: Reduced labial seal;Reduced lingual movement/coordination Oral Phase Functional Implications: Left anterior spillage;Prolonged oral transit Other Comments: gelatin      Sonia Baller, MA, CCC-SLP Speech Therapy

## 2021-12-20 NOTE — Consult Note (Addendum)
NEURO HOSPITALIST CONSULT NOTE   Requestig physician: Dr. Truett Mainland  Reason for Consult: Acute onset of left hemiplegia and rightward gaze deviation  History obtained from:  EMS and Chart     HPI:                                                                                                                                          Lauren [name] is an 71 y.o. female with an extensive PMHx as listed below including a hemorrhagic stroke in May of this year (was [place] in [place] with [place] records available) with RLE weakness, thrombocytosis, CAD, HTN, DM2, tremor disorder (17 year history) and HLD who presents to the ED via EMS as a Code Stroke. LKN was 8:00 PM last night before going to bed. Husband was awakened this AM when she fell out of bed. He found her to be weak on the left side with bleeding from her mouth and called EMS. On EMS arrival she was plegic on the left with left facial droop and rightward gaze deviation. She was dysarthric but alert and oriented. Vitals per EMS: 170/100, HR 80, CBG 137. On arrival to the ED she continued to exhibit left sided weakness, facial droop and dysarthria.   Medications list from her Neurology appointment in August lists ASA and atorvastatin. Formerly was on Plavix but stopped that about one year ago. Also takes Lamictal for her tremors. No history of seizures.   Per outpatient Neurology note from August, her chronic tremors are located to head and bilateral upper extremities., usually worse from the time she gets up in the morning until 2 PM at which time she reports that her tremors completely resolve for the rest of the day. Also per outpatient Neurology note, she stated at the time that since she had the stroke she has right lower extremity weakness and frequent falls. She had completed physical therapy and stated that her balance improved right after physical therapy.     PMHx Hypercholesterolemia HLD Thrombocytosis DM2 Hemorrhagic stroke May 2023 Sleep apnea  COPD History of heart artery stent  History of breast cancer  CAD (coronary artery disease), native coronary artery  History of radiation therapy  GERD (gastroesophageal reflux disease)  Tobacco dependency  Lung nodules  Allergic rhinitis  Attention-deficit hyperactivity disorder, predominantly inattentive type  History of stomach ulcers  Nocturnal oxygen desaturation  Lymphadenopathy of head and neck region  Major depressive disorder, recurrent, mild (HCC)  GAD (generalized anxiety disorder)  Cervical spondylosis  Bipolar 1 disorder (HCC)  Rheumatoid factor positive  Closed fracture of right distal radius  Other nondisplaced fracture of first cervical vertebra, subsequent encounter for fracture with delayed healing  Other nondisplaced fracture of seventh cervical vertebra, subsequent encounter for fracture with delayed healing  Spondylosis without myelopathy or radiculopathy, cervical region  Memory impairment  Unsteady gait  Malignant neoplasm of middle lobe of right lung (HCC)  Facet arthropathy, cervical  Mass of right parotid gland  Cigarette smoker   Family History   Hypertension Mother   Coronary artery disease Mother   Diabetes type II Mother   Tremor Sister   Hypertension Brother   Diabetes Brother   Breast cancer Neg Hx           Social History:  Married Has 2 children   MEDICATIONS:                                                                                                                     Outpatient Encounter Medications as of 11/20/2021, per Care Everywhere  Medication Sig Dispense Refill   acetaminophen (TYLENOL) 500 MG tablet Take 1 tablet (500 mg total) by mouth every 6 (six) hours as needed.   albuterol 90 mcg/actuation inhaler INHALE 2 PUFFS INTO THE LUNGS EVERY 4 HOURS AS NEEDED FOR WHEEZING 1 each 11   aspirin 81 MG EC tablet *ANTIPLATELET*  Take 1 tablet (81 mg total) by mouth daily.   atorvastatin (LIPITOR) 40 MG tablet TAKE 1 TABLET(40 MG) BY MOUTH DAILY 30 tablet 0   BREZTRI AEROSPHERE 160-9-4.8 mcg/actuation inhaler INHALE 2 PUFFS BY MOUTH TWICE DAILY 10.7 g 2   busPIRone (BUSPAR) 30 MG tablet Take 1 tablet (30 mg total) by mouth every 8 (eight) hours.   CALCIUM-VITAMIN D3 ORAL Take 1 capsule by mouth daily.   dextroamphetamine-amphetamine (ADDERALL) 20 mg tablet Take 1 to 2 tabs daily 60 tablet 0   hydrOXYzine HCL (ATARAX) 25 MG tablet Take 1 tablet (25 mg total) by mouth daily as needed.   lamoTRIgine (LAMICTAL) 100 MG tablet Take 1&1/2 at night for a week, then 2 at night (Patient taking differently: Take 2 tablets (200 mg total) by mouth at bedtime. Take 1&1/2 at night for a week, then 2 at night) 180 tablet 1   multivitamin (TAB-A-VITE) tablet Take 1 tablet by mouth daily.   pantoprazole (PROTONIX) 40 MG tablet Take 1 tablet (40 mg total) by mouth 2 times daily. 60 tablet 3   polyethylene glycol 3350 (MIRALAX ORAL) Take 17 g by mouth daily.   QUEtiapine (SEROQUEL) 100 MG tablet Take 1 to 2 at night 180 tablet 1   sucralfate (CARAFATE) 1 gram tablet TAKE 1 TABLET(1 GRAM) BY MOUTH FOUR TIMES DAILY 360 tablet 1   traZODone (DESYREL) 50 MG tablet Take 1 tablet (50 mg total) by mouth nightly.      ROS:  Denies any pain.    There were no vitals taken for this visit.   General Examination:                                                                                                       Physical Exam  General: Diffusely decreased body fat. Appears older than stated age 42-  Normocephalic. Oral cavity with dried blood.    Lungs- Intermittently tachypneic   Neurological Examination Mental Status: Awake and oriented. Decreased level of alertness. Speech is dysarthric but fluent with  intact naming for common words. Comprehension intact for basic commands.  Cranial Nerves: II: Visual fields intact when testing each eye individually, but with extinction on the left to DSS. PERRL.   III,IV, VI: No ptosis. Eyes deviated to the right. Can cross midline to the left with significant difficulty.  V: No sensation to FT or pinch on the left.  VII: Severe left facial droop to lower quadrant.  VIII: Hearing intact to voice IX,X: No hypophonia or hoarseness XI: Weak on the left XII: Tongue deviates to the left Motor: RUE and RLE 5/5 LUE flaccid tone with 2-3/5 strength proximally and distally. LLE 4/5 proximally and distally Sensory: No sensation to FT or pinch on the left.  Deep Tendon Reflexes:   Plantars: Right: weakly upgoing  Left: Briskly upgoing Cerebellar: No right sided ataxia. Sensory ataxia to LUE and LLE is noted.  Gait: Unable to assess  NIHSS: 11   Lab Results: Basic Metabolic Panel: No results for input(s): "NA", "K", "CL", "CO2", "GLUCOSE", "BUN", "CREATININE", "CALCIUM", "MG", "PHOS" in the last 168 hours.  CBC: No results for input(s): "WBC", "NEUTROABS", "HGB", "HCT", "MCV", "PLT" in the last 168 hours.  Cardiac Enzymes: No results for input(s): "CKTOTAL", "CKMB", "CKMBINDEX", "TROPONINI" in the last 168 hours.  Lipid Panel: No results for input(s): "CHOL", "TRIG", "HDL", "CHOLHDL", "VLDL", "LDLCALC" in the last 168 hours.  Imaging: No results found.   Assessment: 71 year old female presenting with acute right MCA stroke - Exam reveals left facial droop, plegic LUE, paretic LLE and complete loss of sensation on the left, as well as left hemineglect.  - CT head: Hyperdense right M1 with subacute hypodensity in the right MCA territory consistent with acute stroke and associated brain parenchymal cytotoxic edema. ASPECTS 5. No associated hemorrhage or midline shift. - CTA head and neck with CTP: Positive for Emergent Large Vessel Occlusion of the  Right MCA mid M1 segment, but pseudo-normalization of the Right MCA infarct on both CBF and CBV. Combined with ASPECTS of 5 and moderately reconstituted appearance of the right MCA branches this constellation does not seem favorable for endovascular reperfusion. Significant stenoses: Right ICA origin, 64%. Proximal Left Subclavian Artery, 72%. - Known left upper lobe indolent lung cancer appears stable since chest CT 07/30/2021, 12 mm. Emphysema also noted. - EKG: Sinus rhythm; Right atrial enlargement; Borderline repolarization abnormality - Not a TNK candidate due to time criteria. Not a thrombectomy candidate due to completed stroke without appreciable salvageable penumbra.   Recommendations: - Hypertonic saline at 50  cc/hr - Repeat CT head in 12 hours to assess for stability. She has small ventricles which is somewhat worrisome from the standpoint of possible development of malignant edema from the stroke.  - HgbA1c, fasting lipid panel - MRI of the brain without contrast - PT consult, OT consult, Speech consult - Echocardiogram - Statin - ASA 81 mg po qd: Has a prior history of ICH but benefits for secondary stroke prevention are overall felt to outweigh risks of recurrent hemorrhage. Will not start DAPT due to relatively high risk of hemorrhagic conversion of her large stroke.  - Risk factor modification - Telemetry monitoring - Frequent neuro checks - NPO until passes stroke swallow screen - Continue her Lamictal, which is being used to treat her tremors.      Electronically signed: Dr. Kerney Elbe 12/20/2021, 7:58 AM

## 2021-12-20 NOTE — Progress Notes (Signed)
Echocardiogram 2D Echocardiogram has been performed.  Lauren [name] 12/20/2021, 3:48 PM

## 2021-12-20 NOTE — Progress Notes (Signed)
Sodium level scheduled to be drawn 1538; next level due now 1400 as last level drawn 0757. Phlebotomy attempted to be called x4 at multiple numbers to come collect. Will continue calling to notify sample needed at this time.

## 2021-12-20 NOTE — H&P (Addendum)
NAME:  Lauren [name], MRN:  [id number], DOB:  March 10, 1951, LOS: 0 ADMISSION DATE:  12/20/2021, CONSULTATION DATE:  12/20/21 REFERRING MD:  EDP, CHIEF COMPLAINT:  Weakness   History of Present Illness:  71 year old woman with hx of CAD, HTN, DM2, tremor disorder, prior hemorrhagic stroke, HLD, thrombocytosis who presents after waking up falling out of bed with left sided weakness, slurred speech, and left facial droop.  Last seen normal last night ~8PM.  Unfortunately imaging shows completed R M1 MCA stroke and she is out of window for intervention.  Does have some developing midline shift so PCCM consulted for admission.  Patient currently with no complaints other than aforementioned neurological issues.  Pertinent  Medical History   Sleep apnea   Hyperlipidemia   COPD (chronic obstructive pulmonary disease) (HCC)   History of heart artery stent   History of breast cancer   CAD (coronary artery disease), native coronary artery   History of radiation therapy   GERD (gastroesophageal reflux disease)   Tobacco dependency   Lung nodules   Allergic rhinitis   Attention-deficit hyperactivity disorder, predominantly inattentive type   History of stomach ulcers   Nocturnal oxygen desaturation   Lymphadenopathy of head and neck region   Major depressive disorder, recurrent, mild (HCC)   GAD (generalized anxiety disorder)   Cervical spondylosis   Bipolar 1 disorder (HCC)   Rheumatoid factor positive   Closed fracture of right distal radius   Other nondisplaced fracture of first cervical vertebra, subsequent encounter for fracture with delayed healing   Other nondisplaced fracture of seventh cervical vertebra, subsequent encounter for fracture with delayed healing   Spondylosis without myelopathy or radiculopathy, cervical region   Memory impairment   Unsteady gait   Malignant neoplasm of middle lobe of right lung (HCC)   Facet arthropathy, cervical   Mass of right parotid gland    Cigarette smoker  Prior hemorrhagic stroke  Significant Hospital Events: Including procedures, antibiotic start and stop dates in addition to other pertinent events   9/23 admit  Interim History / Subjective:  Consulted  Objective   Blood pressure 122/85, pulse 88, temperature 98 F (36.7 C), temperature source Oral, resp. rate (!) 28, height 5' (1.524 m), weight 44 kg, SpO2 95 %.       No intake or output data in the 24 hours ending 12/20/21 1040 Filed Weights   12/20/21 0755 12/20/21 0805  Weight: 44 kg 44 kg    Examination: General: chronically ill thin woman in NAD HENT: L facial droop Lungs: diminished bilaterally, no accessory muscle use Cardiovascular: RRR, ext warm Abdomen: soft, +BS Extremities: No edema, +muscle wasting Neuro: L hemiplegia, R gaze preferance, L facial droop Skin: no rashes  CBC, BMP benign  Resolved Hospital Problem list   N/A  Assessment & Plan:  Acute R MCA stroke with evidence of brain compression- out of window for intervention. Completed stroke with no ischemic prenumbra.  Discussed with Dr. Cheral Marker, due to small ventricles and lack of space for ischemic edema to swell into, will use hypertonic and have close f/u imaging.  Current exam: R gaze preference, L hemiplegia, L facial droop Recent admit for perforated gastric ulcer and hemorrhagic stroke- spontaneous, nontraumatic 7.5 cc left frontoparietal superficial intracerebral hemorrhage with mild surrounding edema in [place], [place]; residual R sided weakness from this; rec'd for repeat MRI for this (see DC summary 08/21/21 in care everywhere) COPD not in flare- on Breztri PTA, follws with Dr. Greggory Stallion [facility] Chronic tremor-  on propranolol CAD- with prior PCI Prior lung ca R side- s/p SBRT Prior hemorrhagic stroke HTN HLD Bipolar 1 ADHD Hx breast cancer- post lumpectomy 2011 Tobacco dependence DJD OSA  - ICU admit - Hypertonic for Na 150-155 - q1h neuro checks - Repeat CT head 12/20/21  at 20:00 ordered - Goal normotension - Hold AC, ASA until cleared by neurology - Echo - GDMT and any other imaging I defer to stroke service - LABA/LAMA nebs - MRI brain in AM - PT/OT/SLP consults - Med rec completed: lamictal presumably for bipolar, I reviewed her records and no prior seizures - Smoking cessation counseling  Best Practice (right click and "Reselect all SmartList Selections" daily)   Diet/type: NPO DVT prophylaxis: SCD GI prophylaxis: PPI Lines: N/A Foley:  N/A Code Status:  full code Last date of multidisciplinary goals of care discussion [Spoke with husband and patient in ER]  Labs   CBC: Recent Labs  Lab 12/20/21 0755 12/20/21 0757  WBC 6.5  --   NEUTROABS 4.2  --   HGB 14.6 15.3*  HCT 45.1 45.0  MCV 89.8  --   PLT 258  --     Basic Metabolic Panel: Recent Labs  Lab 12/20/21 0755 12/20/21 0757  NA 141 140  K 4.0 3.9  CL 107 106  CO2 25  --   GLUCOSE 91 95  BUN 6* 8  CREATININE 0.54 0.50  CALCIUM 8.7*  --    GFR: Estimated Creatinine Clearance: 46.1 mL/min (by C-G formula based on SCr of 0.5 mg/dL). Recent Labs  Lab 12/20/21 0755  WBC 6.5    Liver Function Tests: Recent Labs  Lab 12/20/21 0755  AST 26  ALT 23  ALKPHOS 84  BILITOT 0.8  PROT 6.2*  ALBUMIN 3.1*   No results for input(s): "LIPASE", "AMYLASE" in the last 168 hours. No results for input(s): "AMMONIA" in the last 168 hours.  ABG    Component Value Date/Time   TCO2 28 12/20/2021 0757     Coagulation Profile: Recent Labs  Lab 12/20/21 0755  INR 0.9    Cardiac Enzymes: No results for input(s): "CKTOTAL", "CKMB", "CKMBINDEX", "TROPONINI" in the last 168 hours.  HbA1C: No results found for: "HGBA1C"  CBG: Recent Labs  Lab 12/20/21 0753  GLUCAP 106*    Review of Systems:    Positive Symptoms in bold:  Constitutional fevers, chills, weight loss, fatigue, anorexia, malaise  Eyes decreased vision, double vision, eye irritation  Ears, Nose,  Mouth, Throat sore throat, trouble swallowing, sinus congestion  Cardiovascular chest pain, paroxysmal nocturnal dyspnea, lower ext edema, palpitations   Respiratory SOB, cough, DOE, hemoptysis, wheezing  Gastrointestinal nausea, vomiting, diarrhea  Genitourinary burning with urination, trouble urinating  Musculoskeletal joint aches, joint swelling, back pain  Integumentary  rashes, skin lesions  Neurological focal weakness, focal numbness, trouble speaking, headaches  Psychiatric depression, anxiety, confusion  Endocrine polyuria, polydipsia, cold intolerance, heat intolerance  Hematologic abnormal bruising, abnormal bleeding, unexplained nose bleeds  Allergic/Immunologic recurrent infections, hives, swollen lymph nodes     Past Medical History:  See above  Surgical History:   BREAST LUMPECTOMY Right 2011   BREAST LUMPECTOMY   CARDIAC SURGERY   CERVICAL MEDIAL BRANCH BLOCK Bilateral 02/02/2019  Procedure: CERVICAL MEDIAL BRANCH BLOCK BILATERAL C3-C6; Surgeon: Janus Netty Starring, MD; Location: [place]; Service: [facility]; Laterality: Bilateral;   CERVICAL MEDIAL BRANCH BLOCK Bilateral 02/21/2019  Procedure: BILATERAL CERVICAL MEDIAL BRANCH BLOCK C3-6; Surgeon: Janus Netty Starring, MD; Location:  [facility]; Service: [facility]; Laterality: Bilateral;   DIAGNOSTIC LAPAROSCOPY N/A 08/08/2021  Procedure: DIAGNOSTIC LAPAROSCOPY, REPAIR OF PERFORATED PEPTIC ULCER; Surgeon: Sharolyn Douglas, DO; Location: [facility]; Service: General; Laterality: N/A; NEED EGD   ESOPHAGOSCOPY / EGD 08/08/2021  Procedure: EGD; Surgeon: Sharolyn Douglas, DO; Location: [facility]; Service: General;;   HYSTERECTOMY   KNEE ARTHROSCOPY Left   LAPAROSCOPIC GASTRIC BYPASS N/A 06/20/2014  Procedure: LAPAROSCOPIC ROUX EN Y GASTRIC BYPASS AND INTRAOPERATIVE EGD; Surgeon: Nena Polio, MD; Location: [place]; Service: General; Laterality: N/A;   ORIF DISTAL RADIUS  FRACTURE Right 09/28/2018  Procedure: OPEN TREATMENT DISTAL RADIUS FRACTURE; Surgeon: Lacie Scotts, MD; Location: [place] [facility]; Service: Orthopedics; Laterality: Right; c-arm, [facility], Accumed   PARTIAL HYSTERECTOMY 1980   RADIOFREQUENCY ABLATION Left 07/28/2021  Procedure: RADIOFREQUENCY ABLATION CERVICAL FACET JOINT LEFT C4/5, C5/6 #1/2; Surgeon: Anastasio Champion, MD; Location: [facility]; Service: Pain Services; Laterality: Left;   RADIOFREQUENCY ABLATION NERVES Left 03/09/2019  Procedure: RADIOFREQUENCY MEDIAL DENERVATION CERVICAL LEFT C3-6; Surgeon: Janus Netty Starring, MD; Location: [facility]; Service: [facility]; Laterality: Left;   RADIOFREQUENCY ABLATION NERVES Right 03/21/2019  Procedure: RADIOFREQUENCY MEDIAL DENERVATION CERVICAL RIGHT C3-C6; Surgeon: Janus Netty Starring, MD; Location: [place]; Service: [facility]; Laterality: Right;   STENT PLACEMENT 2009  3   Social History:   Marital status: Married  Spouse name: Shanon Brow   Number of children: 2   Years of education: 12   Highest education level: Not on file  Occupational History   Occupation: retired  Tobacco Use   Smoking status: Every Day  Packs/day: 0.50  Years: 59.00  Additional pack years: 0.00  Total pack years: 29.50  Types: Cigarettes, E-Cig/Vapor w/Nicotine  Start date: 1962   Smokeless tobacco: Never   Tobacco comments:  2022-per 2020 LS form/chart(1)  Vaping Use   Vaping Use: Never used  Substance and Sexual Activity   Alcohol use: Not Currently  Comment: 4 drinks monthly   Drug use: No   Sexual activity: Not Currently  Other Topics Concern   Not on file  Social History Narrative   Not on file   Family History:   Hypertension Mother   Coronary artery disease Mother   Diabetes type II Mother   Tremor Sister   Hypertension Brother   Diabetes Brother   Allergies Allergies  Allergen Reactions   Fluoxetine Rash   Latex Rash     Home  Medications  Prior to Admission medications   Not on File     Critical care time: 35 minutes cc time independent of procedures

## 2021-12-20 NOTE — ED Triage Notes (Signed)
Patient arrives via ems after going to bed last night at 2000 and woke up this am at 0730 to be found with left sided weakness, left facial droop and right sided gaze.  LVO+  LKW 9/22 2000   CBG 137  initially 88% room air and placed on 4L at 95%  Patient covid positive x 6 days ago.  Patient has at some point bitten tongue and has dried blood in mouth.

## 2021-12-21 ENCOUNTER — Inpatient Hospital Stay (HOSPITAL_COMMUNITY): Payer: Medicare Other

## 2021-12-21 DIAGNOSIS — I63311 Cerebral infarction due to thrombosis of right middle cerebral artery: Secondary | ICD-10-CM | POA: Diagnosis not present

## 2021-12-21 LAB — LIPID PANEL
Cholesterol: 151 mg/dL (ref 0–200)
HDL: 30 mg/dL — ABNORMAL LOW (ref >40)
LDL Cholesterol: 103 mg/dL — ABNORMAL HIGH (ref 0–99)
Total CHOL/HDL Ratio: 5 RATIO
Triglycerides: 88 mg/dL (ref <150)
VLDL: 18 mg/dL (ref 0–40)

## 2021-12-21 LAB — CBC
HCT: 42.5 % (ref 36.0–46.0)
Hemoglobin: 14.2 g/dL (ref 12.0–15.0)
MCH: 29.3 pg (ref 26.0–34.0)
MCHC: 33.4 g/dL (ref 30.0–36.0)
MCV: 87.8 fL (ref 80.0–100.0)
Platelets: 293 10*3/uL (ref 150–400)
RBC: 4.84 MIL/uL (ref 3.87–5.11)
RDW: 14 % (ref 11.5–15.5)
WBC: 4 10*3/uL (ref 4.0–10.5)
nRBC: 0 % (ref 0.0–0.2)

## 2021-12-21 LAB — BASIC METABOLIC PANEL
Anion gap: 11 (ref 5–15)
BUN: 6 mg/dL — ABNORMAL LOW (ref 8–23)
CO2: 23 mmol/L (ref 22–32)
Calcium: 8.4 mg/dL — ABNORMAL LOW (ref 8.9–10.3)
Chloride: 111 mmol/L (ref 98–111)
Creatinine, Ser: 0.39 mg/dL — ABNORMAL LOW (ref 0.44–1.00)
GFR, Estimated: >60 mL/min (ref >60)
Glucose, Bld: 103 mg/dL — ABNORMAL HIGH (ref 70–99)
Potassium: 3.4 mmol/L — ABNORMAL LOW (ref 3.5–5.1)
Sodium: 145 mmol/L (ref 135–145)

## 2021-12-21 LAB — GLUCOSE, CAPILLARY: Glucose-Capillary: 97 mg/dL (ref 70–99)

## 2021-12-21 LAB — MRSA NEXT GEN BY PCR, NASAL: MRSA by PCR Next Gen: NOT DETECTED

## 2021-12-21 LAB — SODIUM
Sodium: 145 mmol/L (ref 135–145)
Sodium: 146 mmol/L — ABNORMAL HIGH (ref 135–145)
Sodium: 146 mmol/L — ABNORMAL HIGH (ref 135–145)

## 2021-12-21 MED ORDER — GADOPICLENOL 0.5 MMOL/ML IV SOLN
4.4000 mL | Freq: Once | INTRAVENOUS | Status: AC | PRN
  Administered 2021-12-21: 4.4 mL via INTRAVENOUS

## 2021-12-21 MED ORDER — LAMOTRIGINE 100 MG PO TABS
200.0000 mg | ORAL_TABLET | Freq: Every day | ORAL | Status: DC
  Administered 2021-12-21 – 2021-12-25 (×5): 200 mg via ORAL
  Filled 2021-12-21 (×5): qty 2

## 2021-12-21 MED ORDER — ASPIRIN 81 MG PO TBEC
81.0000 mg | DELAYED_RELEASE_TABLET | Freq: Every day | ORAL | Status: DC
  Administered 2021-12-21 – 2021-12-25 (×5): 81 mg via ORAL
  Filled 2021-12-21 (×5): qty 1

## 2021-12-21 MED ORDER — ATORVASTATIN CALCIUM 40 MG PO TABS
40.0000 mg | ORAL_TABLET | Freq: Every day | ORAL | Status: DC
  Administered 2021-12-21 – 2021-12-24 (×4): 40 mg via ORAL
  Filled 2021-12-21 (×4): qty 1

## 2021-12-21 MED ORDER — QUETIAPINE FUMARATE 200 MG PO TABS
200.0000 mg | ORAL_TABLET | Freq: Every day | ORAL | Status: DC
  Administered 2021-12-21 – 2021-12-24 (×4): 200 mg via ORAL
  Filled 2021-12-21 (×4): qty 1

## 2021-12-21 MED ORDER — POTASSIUM CHLORIDE 20 MEQ PO PACK
40.0000 meq | PACK | Freq: Once | ORAL | Status: AC
  Administered 2021-12-21: 40 meq via ORAL
  Filled 2021-12-21: qty 2

## 2021-12-21 MED ORDER — BUSPIRONE HCL 10 MG PO TABS
30.0000 mg | ORAL_TABLET | Freq: Three times a day (TID) | ORAL | Status: DC
  Administered 2021-12-21 – 2021-12-25 (×12): 30 mg via ORAL
  Filled 2021-12-21: qty 3
  Filled 2021-12-21: qty 2
  Filled 2021-12-21: qty 3
  Filled 2021-12-21: qty 2
  Filled 2021-12-21: qty 3
  Filled 2021-12-21 (×2): qty 2
  Filled 2021-12-21: qty 3
  Filled 2021-12-21: qty 2
  Filled 2021-12-21 (×4): qty 3

## 2021-12-21 MED ORDER — FOOD THICKENER (SIMPLYTHICK): 1.0000 | ORAL | Status: DC | PRN

## 2021-12-21 NOTE — Progress Notes (Signed)
PT Cancellation Note  Patient Details Name: Lauren [name] MRN: [id number] DOB: 1950-05-16   Cancelled Treatment:    Reason Eval/Treat Not Completed: Active bedrest order Continues to have active bedrest order. Will await increase in activity orders prior to PT evaluation. Will follow.   Marguarite Arbour A Deshana Rominger 12/21/2021, 7:02 AM Marisa Severin, PT, DPT Acute Rehabilitation Services Secure chat preferred Office [phone]

## 2021-12-21 NOTE — Progress Notes (Addendum)
STROKE TEAM PROGRESS NOTE   INTERVAL HISTORY Her husband is at the bedside.  She did have a COVID positive test at home. [facility] 11 on arrival HTS for cytotoxic edema- d/c today and MRI brain w contrast due to hx of cancer and ICH without clear etiology in May 2023.   Vitals:   12/21/21 0400 12/21/21 0500 12/21/21 0600 12/21/21 0700  BP: (!) 150/87 (!) 144/82 126/74 (!) 145/74  Pulse: 81 85 72 75  Resp: (!) 30  (!) 30 (!) 28  Temp: 98.4 F (36.9 C)     TempSrc: Oral     SpO2: 93%  96% 98%  Weight:      Height:       CBC:  Recent Labs  Lab 12/20/21 0755 12/20/21 0757 12/21/21 0324  WBC 6.5  --  4.0  NEUTROABS 4.2  --   --   HGB 14.6 15.3* 14.2  HCT 45.1 45.0 42.5  MCV 89.8  --  87.8  PLT 258  --  481   Basic Metabolic Panel:  Recent Labs  Lab 12/20/21 0755 12/20/21 0757 12/20/21 1450 12/20/21 2222 12/21/21 0324  NA 141 140   < > 142 145  K 4.0 3.9  --   --  3.4*  CL 107 106  --   --  111  CO2 25  --   --   --  23  GLUCOSE 91 95  --   --  103*  BUN 6* 8  --   --  6*  CREATININE 0.54 0.50  --   --  0.39*  CALCIUM 8.7*  --   --   --  8.4*   < > = values in this interval not displayed.   Lipid Panel:  Recent Labs  Lab 12/21/21 0324  CHOL 151  TRIG 88  HDL 30*  CHOLHDL 5.0  VLDL 18  LDLCALC 103*   HgbA1c:  Recent Labs  Lab 12/20/21 0755  HGBA1C 5.8*   Urine Drug Screen: No results for input(s): "LABOPIA", "COCAINSCRNUR", "LABBENZ", "AMPHETMU", "THCU", "LABBARB" in the last 168 hours.  Alcohol Level  Recent Labs  Lab 12/20/21 0755  ETH <10    IMAGING past 24 hours MR BRAIN WO CONTRAST  Result Date: 12/21/2021 CLINICAL DATA:  71 year old female code stroke presentation with right MCA infarct visible on presentation CT, distal M1 LV0 with reconstitution. EXAM: MRI HEAD WITHOUT CONTRAST TECHNIQUE: Multiplanar, multiecho pulse sequences of the brain and surrounding structures were obtained without intravenous contrast. COMPARISON:  CT head, CTA head and  neck, CT Perfusion yesterday. Brain MRI 03/20/2021. FINDINGS: Brain: Confluent restricted diffusion in the right MCA territory anterior and middle divisions including dense involvement of the right basal ganglia, insula and anterior operculum. Associated cytotoxic edema with T2 and FLAIR hyperintensity, gyral swelling. The pattern is similar to the T-max > 4s perfusion appearance yesterday. Trace petechial hemorrhage in the superior right caudate (Heidelberg Classification 1A). No other hemorrhagic transformation. Mild mass effect on the right lateral ventricle with only trace leftward midline shift. Basilar cisterns remain patent. No contralateral left hemisphere or posterior fossa abnormal diffusion. But a small area of encephalomalacia in the left superior frontal gyrus which is new from last year is associated with dense new hemosiderin on SWI series 15, image 46. No edema or mass effect there. No ventriculomegaly, extra-axial collection. Cervicomedullary junction and pituitary are within normal limits. Vascular: Major intracranial vascular flow voids appear stable compared to last year. Skull and upper cervical spine:  Negative. Visualized bone marrow signal is within normal limits. Sinuses/Orbits: Stable and negative. Other: Mastoids remain clear. Visible scalp and face appear negative. IMPRESSION: 1. Confluent moderate sized right MCA infarct. Trace petechial hemorrhage in the right caudate. No other hemorrhagic transformation. No significant intracranial mass effect. 2. Chronic confluent hemosiderin with encephalomalacia in the posterolateral left superior frontal gyrus is new from the MRI last year. Electronically Signed   By: Genevie Ann M.D.   On: 12/21/2021 05:30   CT HEAD WO CONTRAST (5MM)  Result Date: 12/20/2021 CLINICAL DATA:  Stroke follow-up EXAM: CT HEAD WITHOUT CONTRAST TECHNIQUE: Contiguous axial images were obtained from the base of the skull through the vertex without intravenous contrast.  RADIATION DOSE REDUCTION: This exam was performed according to the departmental dose-optimization program which includes automated exposure control, adjustment of the mA and/or kV according to patient size and/or use of iterative reconstruction technique. COMPARISON:  CT head 12/20/2021 at 8:04 a.m. FINDINGS: Brain: Similar cytotoxic edema in the right MCA territory affecting the right insula, lentiform nuclei, and internal capsule. No hemorrhagic transformation or midline shift. No evidence of new infarct. No hydrocephalus. The basal cisterns are patent. Vascular: Calcified atherosclerosis in the skull base. The previously hyperdense right M1 MCA appears less dense today. Skull: Normal. Negative for fracture or focal lesion. Sinuses/Orbits: No acute finding. Other: None. IMPRESSION: Similar right MCA territory infarct compared with CT head earlier today. No hemorrhagic transformation or midline shift. Electronically Signed   By: Placido Sou M.D.   On: 12/20/2021 21:52   ECHOCARDIOGRAM COMPLETE  Result Date: 12/20/2021    ECHOCARDIOGRAM REPORT   Patient Name:   Lauren [name] Date of Exam: 12/20/2021 Medical Rec #:  [id number]      Height:       60.0 in Accession #:    [id number]     Weight:       97.0 lb Date of Birth:  01-Jul-1950     BSA:          1.372 m Patient Age:    71 years       BP:           134/88 mmHg Patient Gender: F              HR:           98 bpm. Exam Location:  Inpatient Procedure: 2D Echo, Cardiac Doppler and Color Doppler Indications:    Stroke I63.9  History:        Patient has no prior history of Echocardiogram examinations.                 Risk Factors:Diabetes and Hypertension.  Sonographer:    Bernadene Person RDCS Referring Phys: [id number] Candee Furbish  Sonographer Comments: Suboptimal parasternal window. Image acquisition challenging due to patient body habitus. IMPRESSIONS  1. Left ventricular ejection fraction, by estimation, is 50 to 55%. The left ventricle has low normal  function. The left ventricle has no regional wall motion abnormalities. There is mild left ventricular hypertrophy. Left ventricular diastolic parameters are consistent with Grade I diastolic dysfunction (impaired relaxation).  2. Right ventricular systolic function is normal. The right ventricular size is normal. There is normal pulmonary artery systolic pressure. The estimated right ventricular systolic pressure is 76.5 mmHg.  3. The mitral valve is normal in structure. No evidence of mitral valve regurgitation. No evidence of mitral stenosis.  4. The aortic valve was not well visualized. Aortic valve regurgitation is not visualized. No  aortic stenosis is present.  5. The inferior vena cava is normal in size with greater than 50% respiratory variability, suggesting right atrial pressure of 3 mmHg. FINDINGS  Left Ventricle: Left ventricular ejection fraction, by estimation, is 50 to 55%. The left ventricle has low normal function. The left ventricle has no regional wall motion abnormalities. The left ventricular internal cavity size was normal in size. There is mild left ventricular hypertrophy. Left ventricular diastolic parameters are consistent with Grade I diastolic dysfunction (impaired relaxation). Right Ventricle: The right ventricular size is normal. No increase in right ventricular wall thickness. Right ventricular systolic function is normal. There is normal pulmonary artery systolic pressure. The tricuspid regurgitant velocity is 2.48 m/s, and  with an assumed right atrial pressure of 3 mmHg, the estimated right ventricular systolic pressure is 73.5 mmHg. Left Atrium: Left atrial size was normal in size. Right Atrium: Right atrial size was normal in size. Pericardium: Trivial pericardial effusion is present. Mitral Valve: The mitral valve is normal in structure. No evidence of mitral valve regurgitation. No evidence of mitral valve stenosis. Tricuspid Valve: The tricuspid valve is normal in structure.  Tricuspid valve regurgitation is trivial. Aortic Valve: The aortic valve was not well visualized. Aortic valve regurgitation is not visualized. No aortic stenosis is present. Pulmonic Valve: The pulmonic valve was not well visualized. Pulmonic valve regurgitation is not visualized. Aorta: The aortic root and ascending aorta are structurally normal, with no evidence of dilitation. Venous: The inferior vena cava is normal in size with greater than 50% respiratory variability, suggesting right atrial pressure of 3 mmHg. IAS/Shunts: The interatrial septum was not well visualized.  LEFT VENTRICLE PLAX 2D LVIDd:         4.40 cm     Diastology LVIDs:         2.80 cm     LV e' medial:    6.12 cm/s LV PW:         0.90 cm     LV E/e' medial:  6.9 LV IVS:        0.70 cm     LV e' lateral:   8.51 cm/s LVOT diam:     1.90 cm     LV E/e' lateral: 5.0 LV SV:         51 LV SV Index:   37 LVOT Area:     2.84 cm  LV Volumes (MOD) LV vol d, MOD A2C: 87.5 ml LV vol d, MOD A4C: 69.0 ml LV vol s, MOD A2C: 45.4 ml LV vol s, MOD A4C: 33.6 ml LV SV MOD A2C:     42.1 ml LV SV MOD A4C:     69.0 ml LV SV MOD BP:      40.8 ml RIGHT VENTRICLE RV S prime:     10.30 cm/s TAPSE (M-mode): 1.7 cm LEFT ATRIUM             Index        RIGHT ATRIUM          Index LA diam:        2.00 cm 1.46 cm/m   RA Area:     9.66 cm LA Vol (A2C):   28.3 ml 20.62 ml/m  RA Volume:   17.10 ml 12.46 ml/m LA Vol (A4C):   18.3 ml 13.33 ml/m LA Biplane Vol: 24.2 ml 17.63 ml/m  AORTIC VALVE LVOT Vmax:   98.50 cm/s LVOT Vmean:  66.500 cm/s LVOT VTI:    0.179 m  AORTA Ao Root diam: 3.10 cm Ao Asc diam:  3.20 cm MITRAL VALVE               TRICUSPID VALVE MV Area (PHT): 3.48 cm    TR Peak grad:   24.6 mmHg MV Decel Time: 218 msec    TR Vmax:        248.00 cm/s MV E velocity: 42.30 cm/s MV A velocity: 60.70 cm/s  SHUNTS MV E/A ratio:  0.70        Systemic VTI:  0.18 m                            Systemic Diam: 1.90 cm Oswaldo Milian MD Electronically signed by  Oswaldo Milian MD Signature Date/Time: 12/20/2021/4:05:58 PM    Final    CT Cervical Spine Wo Contrast  Result Date: 12/20/2021 CLINICAL DATA:  71 year old female code stroke presentation. EXAM: CT CERVICAL SPINE WITH CONTRAST TECHNIQUE: Multiplanar CT images of the cervical spine were reconstructed from contemporary CTA of the Neck. RADIATION DOSE REDUCTION: This exam was performed according to the departmental dose-optimization program which includes automated exposure control, adjustment of the mA and/or kV according to patient size and/or use of iterative reconstruction technique. CONTRAST:  No additional COMPARISON:  CTA head and neck today reported separately. Cervical spine CT 11/17/2018. FINDINGS: Alignment: Chronic straightening of lower cervical lordosis. Cervicothoracic junction alignment is within normal limits. Bilateral posterior element alignment is within normal limits. Skull base and vertebrae: Visualized skull base is intact. No atlanto-occipital dissociation. Healed left anterior C1 ring fracture since 2020. Maintained C1-C2 alignment. Previously seen left C6 and C7 superior articulating facet fractures have healed (series 3, image 64). No acute osseous abnormality identified. Soft tissues and spinal canal: No prevertebral fluid or swelling. No visible canal hematoma. Neck CTA findings reported separately. Disc levels: Intermittent cervical disc and endplate degeneration appears stable since 2020. Upper chest: Reported separately. IMPRESSION: 1. No acute osseous abnormality identified in the cervical spine. Healed left C1 ring, left C6 and C7 posterior element fractures since 2020. 2. CTA head and neck today reported separately. Electronically Signed   By: Genevie Ann M.D.   On: 12/20/2021 08:56   CT ANGIO HEAD NECK W WO CM W PERF (CODE STROKE)  Result Date: 12/20/2021 CLINICAL DATA:  Code stroke. 71 year old female. Known indolent left upper lobe lung carcinoma. EXAM: CT ANGIOGRAPHY HEAD  AND NECK CT PERFUSION BRAIN TECHNIQUE: Multidetector CT imaging of the head and neck was performed using the standard protocol during bolus administration of intravenous contrast. Multiplanar CT image reconstructions and MIPs were obtained to evaluate the vascular anatomy. Carotid stenosis measurements (when applicable) are obtained utilizing NASCET criteria, using the distal internal carotid diameter as the denominator. Multiphase CT imaging of the brain was performed following IV bolus contrast injection. Subsequent parametric perfusion maps were calculated using RAPID software. RADIATION DOSE REDUCTION: This exam was performed according to the departmental dose-optimization program which includes automated exposure control, adjustment of the mA and/or kV according to patient size and/or use of iterative reconstruction technique. CONTRAST:  24mL OMNIPAQUE IOHEXOL 350 MG/ML SOLN COMPARISON:  Plain head CT 0805 hours today Chest CT 07/30/2021. FINDINGS: CT Brain Perfusion Findings: ASPECTS: 5 CBF (<30%) Volume: 0 by the standard criteria, and no CBV alterations, suggesting subacute cytotoxic edema on the plain head CT. Perfusion (Tmax>6.0s) volume: 50mL by the standard criteria, up to 54 mL using T-max greater than 4 S. Mismatch Volume:  Difficult to determine considering pseudonormalization of CBF and CBV. Suspect little salvageable penumbra given this constellation. Infarction Location:Right MCA CTA NECK Skeleton: Largely absent dentition. Mild mandible motion artifact. Mild cervical spine degeneration for age. No acute osseous abnormality identified. Upper chest: Emphysema and apical lung scarring. Known left upper lobe lung cancer is 12 mm on series 5, image 151, stable since 07/30/2021. Other neck: No acute finding. Aortic arch: Calcified aortic atherosclerosis. Entire arch not included. Right carotid system: Visible brachiocephalic artery with only mild plaque. Negative right CCA origin. Mild right CCA  calcified plaque without stenosis. Right ICA origin calcified plaque with up to 64 % stenosis with respect to the distal vessel. Additional right ICA bulb calcified plaque with lesser stenosis. Left carotid system: Left CCA origin not included. Mild left CCA plaque proximal to the bifurcation. Bulky calcified plaque at the left ICA origin. 50% stenosis at the left ICA origin. Less pronounced calcified plaque at the left ICA bulb. Vertebral arteries: Proximal right subclavian artery plaque without stenosis. Minimal plaque at the right vertebral artery origin without stenosis. Right vertebral artery is mildly dominant and patent to the skull base without stenosis. Bulky proximal left subclavian artery plaque with up to 72 % stenosis with respect to the distal vessel series 5, image 167. Plaque near the left vertebral artery origin but only mild origin stenosis. Left vertebral artery is mildly non dominant with additional V1 and V2 calcified plaque but remains patent to the skull base with no significant stenosis. CTA HEAD Posterior circulation: Mild bilateral distal vertebral artery calcified plaque without stenosis. Patent PICA origins and vertebrobasilar junction. Patent basilar artery without stenosis. Patent SCA and PCA origins. Posterior communicating arteries are diminutive or absent. Bilateral PCA branches are within normal limits. Anterior circulation: Both ICA siphons are patent with calcified plaque. Only mild bilateral siphon stenosis. Patent carotid termini, MCA and ACA origins. Anterior communicating artery and bilateral ACA branches are within normal limits. Left MCA M1 segment and bifurcation are patent without stenosis. Left MCA branches are within normal limits. Right MCA origin is patent. The right M1 is occluded on series 12, image 23 about 10 mm from its origin proximal to the bifurcation. However, the bifurcation and MCA branches are moderately reconstituted as seen on series 11, image 16. Venous  sinuses: Grossly patent. Anatomic variants: Mildly dominant right vertebral artery. Review of the MIP images confirms the above findings Salient findings discussed by telephone with Dr. Kerney Elbe on 12/20/2021 at 0824 hours. IMPRESSION: 1. Positive for Emergent Large Vessel Occlusion of the Right MCA mid M1 segment, but pseudo-normalization of the Right MCA infarct on both CBF and CBV. Combined with ASPECTS of 5 and moderately reconstituted appearance of the right MCA branches this constellation does not seem favorable for endovascular reperfusion. These findings discussed by telephone with Dr. Kerney Elbe on 12/20/2021 at 0824 hours. 2. Known left upper lobe indolent lung cancer appears stable since chest CT 07/30/2021, 12 mm. Emphysema (ICD10-J43.9). 3. Aortic Atherosclerosis (ICD10-I70.0) and atherosclerosis in the neck and at the skull base. Significant stenoses: - Right ICA origin, 64%. - proximal Left Subclavian Artery, 72%. Electronically Signed   By: Genevie Ann M.D.   On: 12/20/2021 08:42   CT HEAD CODE STROKE WO CONTRAST  Result Date: 12/20/2021 CLINICAL DATA:  Code stroke.  71 year old female EXAM: CT HEAD WITHOUT CONTRAST TECHNIQUE: Contiguous axial images were obtained from the base of the skull through the vertex without intravenous contrast. RADIATION DOSE REDUCTION: This exam was performed  according to the departmental dose-optimization program which includes automated exposure control, adjustment of the mA and/or kV according to patient size and/or use of iterative reconstruction technique. COMPARISON:  Brain MRI 03/20/2021. Head CT 09/17/2018. FINDINGS: Brain: Cytotoxic edema in the right MCA territory (series 2, image 22). No hemorrhagic transformation or midline shift. M1 and M2 segments affected. Right insula, lentiform and internal capsule affected. Elsewhere gray-white matter differentiation appears normal. Basilar cisterns are preserved. No ventriculomegaly. Vascular: Calcified  atherosclerosis at the skull base. Hyperdense right MCA M1 on series 5, image 21. Skull: No acute osseous abnormality identified. Sinuses/Orbits: Visualized paranasal sinuses and mastoids are stable and well aerated. Other: Rightward gaze. Visualized scalp soft tissues are within normal limits. ASPECTS [facility] Stroke Program Early CT Score) Total score (0-10 with 10 being normal): 5, as above IMPRESSION: 1. Positive for right MCA cytotoxic edema and hyperdense Right MCA compatible with ELVO. ASPECTS five. 2. No associated hemorrhage or midline shift. 3. These results were communicated to Dr. Cheral Marker at 8:22 am on 12/20/2021 by text page via the [facility] messaging system. Electronically Signed   By: Genevie Ann M.D.   On: 12/20/2021 08:23    PHYSICAL EXAM  Physical Exam  Constitutional: Appears frail malnourished looking middle-aged Caucasian lady Cardiovascular: Normal rate and regular rhythm.  Respiratory: Effort normal, non-labored breathing  Neuro: Mental Status: Awake and oriented. Decreased level of alertness. Speech is dysarthric but fluent with intact naming for common words. Comprehension intact for basic commands.  Cranial Nerves: II: Visual fields intact when testing each eye individually, but with extinction on the left to DSS. PERRL.   III,IV, VI: No ptosis. Eyes deviated to the right. Can cross midline to the left with significant difficulty.  V: No sensation to FT or pinch on the left.  VII: Severe left facial droop to lower quadrant.  VIII: Hearing intact to voice IX,X: No hypophonia or hoarseness XI: Weak on the left XII: Tongue deviates to the left Motor: RUE and RLE 5/5 LUE flaccid tone with 2-3/5 strength proximally and distally. LLE 4/5 proximally and distally Sensory: No sensation to FT or pinch on the left.  Gait: Unable to assess    ASSESSMENT/PLAN Ms. Deztiny Sarra is a 71 y.o. female with history of CAD, HTN, DM2, tremor disorder, prior hemorrhagic stroke, HLD,  thrombocytosis who presents after waking up falling out of bed with left sided weakness, slurred speech, and left facial droop presenting with left side weakness.  Stroke:  Right MCA infarct Etiology:  in the setting of intracranial large vessel atherosclerotic  disease and uncontrolled risk factors    Code Stroke CT head Positive for right MCA cytotoxic edema and hyperdense Right MCA compatible with ELVO. ASPECTS five. CTA head & neck with perfusion- Positive for Emergent Large Vessel Occlusion of the Right MCA mid M1 segment, but pseudo-normalization of the Right MCA infarct on both CBF and CBV. Combined with ASPECTS of 5 and moderately reconstituted appearance of the right MCA branches this constellation does not seem favorable for endovascular reperfusion. Significant stenoses: Right ICA origin, 64%. Proximal Left Subclavian Artery, 72%. MRI  Confluent moderate sized right MCA infarct. Trace petechial hemorrhage in the right caudate. No other hemorrhagic transformation. No significant intracranial mass effect. Chronic confluent hemosiderin with encephalomalacia in the posterolateral left superior frontal gyrus is new from the MRI last year. MRI with Contrast- no enhancement to suggest metastatic disease to the brain. 2D Echo EF 50-55% LDL 103 HgbA1c 5.8 VTE prophylaxis - SCDs  Diet   Diet NPO time specified Except for: Ice Chips, Other (See Comments)   No antithrombotic prior to admission, now on aspirin 81 mg daily.  Therapy recommendations:  Acute inpatient rehab Disposition:  pending  Hypertension Home meds: none Stable Permissive hypertension (OK if < 220/120) but gradually normalize in 5-7 days Long-term BP goal normotensive  Hyperlipidemia Home meds:  Atorvastatin 40mg , resumed in hospital LDL 103, goal < 70 Add Atorvastatin 40mg   High intensity statin not indicated  Continue statin at discharge  Other Stroke Risk Factors Advanced Age >/= 52  Obesity, Body mass index is  18.94 kg/m., BMI >/= 30 associated with increased stroke risk, recommend weight loss, diet and exercise as appropriate  Cigarette smoker, advised to stop smoking Hx stroke/TIA 07/2021 spontaneous, nontraumatic 7.5 cc left frontoparietal superficial intracerebral hemorrhage with unknown etiology, seen at [facility] Residual right lower extremity weakness Coronary artery disease [facility]- Dr. Marijo File Recommend ASA 81mg  Obstructive sleep apnea- does not use CPAP  Other Active Problems Hx of cancer - Lung nodule, Breast cancer, Parotid lesions Currently following with [facility] No signs of metastatic CA on post contrast brain MRI   Essential tremors in upper extremities and head- seen 11/20/2021 by [facility]  Recommended propanolol 60mg  Bipolar disorder Seroquel, lamictal, trazodone, buspar   Hospital day # 1  Patient seen and examined by NP/APP with MD. MD to update note as needed.   Janine Ores, DNP, FNP-BC [facility] Pager: [phone]  STROKE MD NOTE :  I have personally obtained history,examined this patient, reviewed notes, independently viewed imaging studies, participated in medical decision making and plan of care.ROS completed by me personally and pertinent positives fully documented  I have made any additions or clarifications directly to the above note. Agree with note above.  Patient presented with dysarthria and left hemiplegia due to large right MCA infarct unfortunately presented outside time window for thrombolysis or thrombectomy due to low aspect score of 5.  She has mild subarachnoid edema but since CT angiogram shows patency of the MCA is unlikely to get worse.  Recommend continue hypertonic saline but start tapering and discontinue over the next 1 to 4 hours.  Repeat CT scan with tomorrow morning.  Check MRI scan of the brain with contrast today to rule out metastasis given history of breast and suspected lung cancer.  Continue close neurological  monitoring.  Continue ongoing stroke work-up.  Start aspirin for now but will likely need dual antiplatelet therapy at discharge and aggressive risk factor modification.  Long discussion with patient and husband at the bedside and answered questions.  Discussed with Dr. Halford Chessman critical care medicine.This patient is critically ill and at significant risk of neurological worsening, death and care requires constant monitoring of vital signs, hemodynamics,respiratory and cardiac monitoring, extensive review of multiple databases, frequent neurological assessment, discussion with family, other specialists and medical decision making of high complexity.I have made any additions or clarifications directly to the above note.This critical care time does not reflect procedure time, or teaching time or supervisory time of PA/NP/Med Resident etc but could involve care discussion time.  I spent 30 minutes of neurocritical care time  in the care of  this patient.      Antony Contras, MD Medical Director [facility] Pager: [phone] 12/21/2021 4:53 PM   To contact Stroke Continuity provider, please refer to [email or web address] After hours, contact General Neurology

## 2021-12-21 NOTE — Progress Notes (Signed)
NAME:  Lauren [name], MRN:  [id number], DOB:  [birth year], LOS: 1 ADMISSION DATE:  12/20/2021, CONSULTATION DATE:  12/20/21 REFERRING MD:  EDP, CHIEF COMPLAINT:  Weakness   History of Present Illness:  71 year old woman with hx of CAD, HTN, DM2, tremor disorder, prior hemorrhagic stroke, HLD, thrombocytosis who presents after waking up falling out of bed with left sided weakness, slurred speech, and left facial droop.  Last seen normal last night ~8PM.  Unfortunately imaging shows completed R M1 MCA stroke and she is out of window for intervention.  Does have some developing midline shift so PCCM consulted for admission.  Patient currently with no complaints other than aforementioned neurological issues.  Pertinent  Medical History   Sleep apnea   Hyperlipidemia   COPD (chronic obstructive pulmonary disease) ([place])   History of heart artery stent   History of breast cancer   CAD (coronary artery disease), native coronary artery   History of radiation therapy   GERD (gastroesophageal reflux disease)   Tobacco dependency   Lung nodules   Allergic rhinitis   Attention-deficit hyperactivity disorder, predominantly inattentive type   History of stomach ulcers   Nocturnal oxygen desaturation   Lymphadenopathy of head and neck region   Major depressive disorder, recurrent, mild (HCC)   GAD (generalized anxiety disorder)   Cervical spondylosis   Bipolar 1 disorder (HCC)   Rheumatoid factor positive   Closed fracture of right distal radius   Other nondisplaced fracture of first cervical vertebra, subsequent encounter for fracture with delayed healing   Other nondisplaced fracture of seventh cervical vertebra, subsequent encounter for fracture with delayed healing   Spondylosis without myelopathy or radiculopathy, cervical region   Memory impairment   Unsteady gait   Malignant neoplasm of middle lobe of right lung (HCC)   Facet arthropathy, cervical   Mass of right parotid gland    Cigarette smoker  Prior hemorrhagic stroke  Significant Hospital Events: Including procedures, antibiotic start and stop dates in addition to other pertinent events   9/23 admit.  Started on hypotonic 3% saline 9/24 MRI sized right MCA infarct with trace petechial hemorrhage in the right caudate no other hemorrhagic transformation chronic confluent hemosiderin with encephalomalacia  Interim History / Subjective:  Awake stable  Objective   Blood pressure (Abnormal) 145/74, pulse 75, temperature 98.4 F (36.9 C), temperature source Oral, resp. rate (Abnormal) 28, height 5' (1.524 m), weight 44 kg, SpO2 98 %.        Intake/Output Summary (Last 24 hours) at 12/21/2021 0741 Last data filed at 12/21/2021 0700 Gross per 24 hour  Intake 990.22 ml  Output 750 ml  Net 240.22 ml   Filed Weights   12/20/21 0755 12/20/21 0805  Weight: 44 kg 44 kg    Examination:  General: Frail 71 year old female patient sitting up in bed she is in no acute distress today HEENT normocephalic atraumatic no jugular venous distention does have left-sided facial droop Pulmonary: Clear to auscultation Cardiac: Regular rate and rhythm Abdomen: Soft nontender Extremities: Warm dry brisk cap refill Neuro: Awake, oriented x3, slightly dysarthric with left-sided facial droop, and left-sided weakness.  Resolved Hospital Problem list   N/A  Assessment & Plan:  Acute R MCA stroke with evidence of brain compression, recent h/o hemorrhagic stroke 5/23- out of window for intervention. Completed stroke with no ischemic prenumbra.  Presenting exam: R gaze preference, L hemiplegia, L facial droop Plan Supportive care Serial neurochecks Currently on 3% infusion, will defer to neurology  COVID-19.  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 Alvin Critchley Plan Continue bronchodilators currently on Brovana and Yupelri while in the hospital  H/o CAD, HTN and HLD  with prior PCI Plan Telemetry monitoring  Prior lung ca R side s/p SBRT , prior breast cancer - s/p lumpectomy 2011 Plan  F/u outpt   H/o Bipolar 1 and ADHD Plan She will need to go back on her Seroquel, BuSpar, and Lamictal once she can take p.o.'s  DJD Plan As needed Tylenol  H/o OSA Plan Pulse oximetry  Best Practice (right click and "Reselect all SmartList Selections" daily)   Diet/type: NPO DVT prophylaxis: SCD GI prophylaxis: PPI Lines: N/A Foley:  N/A Code Status:  full code Last date of multidisciplinary goals of care discussion [Spoke with husband and patient in [place] ACNP-BC [place] Pager # [phone] OR # [phone] if no answer

## 2021-12-21 NOTE — Plan of Care (Signed)
  Problem: Health Behavior/Discharge Planning: Goal: Ability to manage health-related needs will improve Outcome: Progressing   Problem: Nutritional: Goal: Maintenance of adequate nutrition will improve Outcome: Progressing   Problem: Education: Goal: Knowledge of disease or condition will improve Outcome: Progressing   Problem: Self-Care: Goal: Ability to communicate needs accurately will improve Outcome: Progressing   Problem: Nutrition: Goal: Risk of aspiration will decrease Outcome: Progressing Goal: Dietary intake will improve Outcome: Progressing   Problem: Clinical Measurements: Goal: Ability to maintain clinical measurements within normal limits will improve Outcome: Progressing Goal: Diagnostic test results will improve Outcome: Progressing   Problem: Activity: Goal: Risk for activity intolerance will decrease Outcome: Progressing   Problem: Nutrition: Goal: Adequate nutrition will be maintained Outcome: Progressing   Problem: Elimination: Goal: Will not experience complications related to urinary retention Outcome: Progressing

## 2021-12-21 NOTE — Evaluation (Signed)
Speech Language Pathology Evaluation Patient Details Name: Lauren [name] MRN: [id number] DOB: [birth year] Today's Date: 12/21/2021 Time: 1050-1108 SLP Time Calculation (min) (ACUTE ONLY): 18 min  Problem List:  Patient Active Problem List   Diagnosis Date Noted   Stroke [facility]) 12/20/2021   Past Medical History:  Past Medical History:  Diagnosis Date   Diabetes mellitus without complication ([place])    Hypertension    Past Surgical History: History reviewed. No pertinent surgical history. HPI:  Patient is a 71 y.o. female with PMH: CAD, HTN, DM-2, COPD, GERD, lung nodules, right parotid mass,  tobacco dependency, ADD, stomach ulcers, tremor disorder, prior hemorrhagic stroke, HLD, thrombocytosis. She presented to the hospital on 12/19/21 with left sided weakness, slurred speech and left sided facial droop. Imaging showed moderately sized right MCA CVA including dense involvement of the right basal ganglia, insula and anterior operculum. She was out of window for intervention; some developing midline shift. She failed Yale swallow with RN and SLP ordered for swallow evaluation.   Assessment / Plan / Recommendation Clinical Impression  Lauren [name] presents with cognitive-communicative deficits s/p right MCA CVA. She displays left inattention, poor sustained and selective attention to task. She reports hx of memory deficits, however during today's session she demonstrated functional recall of verbal instructions and tasks that were completed during session.  She demonstrated adequate oral reading of page from her kindle with no errors of omission on left side of page.  There was impaired awareness and self-monitoring of deficits. Receptive/expressive language were intact. Speech was fluent but dysarthric. Affect was flat.  Pt will benefit from SLP intervention while on acute care and is a good candidate for AIR.    SLP Assessment  SLP Recommendation/Assessment: Patient needs continued Speech  [place] [facility] SLP Visit Diagnosis: Cognitive communication deficit (R41.841)    Recommendations for follow up therapy are one component of a multi-disciplinary discharge planning process, led by the attending physician.  Recommendations may be updated based on patient status, additional functional criteria and insurance authorization.    Follow Up Recommendations  Acute inpatient rehab (3hours/day)    Assistance Recommended at Discharge  Frequent or constant Supervision/Assistance  Functional Status Assessment Patient has had a recent decline in their functional status and demonstrates the ability to make significant improvements in function in a reasonable and predictable amount of time.  Frequency and Duration min 2x/week  2 weeks      SLP Evaluation Cognition  Overall Cognitive Status: Impaired/Different from baseline Arousal/Alertness: Awake/alert Orientation Level: Oriented to person;Oriented to place;Disoriented to time Attention: Selective Selective Attention: Impaired Selective Attention Impairment: Verbal basic Memory: Impaired Memory Impairment: Retrieval deficit Awareness: Impaired Awareness Impairment: Intellectual impairment Safety/Judgment: Impaired       Comprehension  Auditory Comprehension Overall Auditory Comprehension: Appears within functional limits for tasks assessed Reading Comprehension Reading Status: Within funtional limits    Expression Expression Primary Mode of Expression: Verbal Verbal Expression Overall Verbal Expression: Appears within functional limits for tasks assessed Pragmatics: Impairment Impairments: Abnormal affect;Monotone Written Expression Dominant Hand: Right Written Expression: Not tested   Oral / Motor  Oral Motor/Sensory Function Overall Oral Motor/Sensory Function: Moderate impairment Facial ROM: Reduced left;Suspected CN VII (facial) dysfunction Facial Symmetry: Abnormal symmetry left;Suspected CN VII  (facial) dysfunction Facial Strength: Reduced left;Suspected CN VII (facial) dysfunction Facial Sensation: Reduced left;Suspected CN V (Trigeminal) dysfunction Lingual ROM: Reduced left;Suspected CN XII (hypoglossal) dysfunction Lingual Symmetry: Abnormal symmetry left;Suspected CN XII (hypoglossal) dysfunction Motor Speech Overall Motor Speech: Impaired Articulation: Impaired Level of  Impairment: Phrase Intelligibility: Intelligibility reduced Sentence: 75-100% accurate Conversation: 75-100% accurate Motor Planning: Witnin functional limits            Juan Quam Laurice 12/21/2021, 1:35 PM Brandolyn Shortridge L. Tivis Ringer, MA CCC/SLP Clinical Specialist - [place] Office number [phone]

## 2021-12-21 NOTE — Progress Notes (Signed)
Speech Language Pathology Treatment: Dysphagia  Patient Details Name: Lauren [name] MRN: [id number] DOB: 1951-01-23 Today's Date: 12/21/2021 Time: 1040-1050 SLP Time Calculation (min) (ACUTE ONLY): 10 min  Assessment / Plan / Recommendation Clinical Impression  Pt was seen for f/u after yesterday's initial swallow evaluation. Her husband was at bedside.  She demonstrated ongoing poor sensation of POs on left side, with oral retention/pocketing and leakage from left side of mouth.  Thin liquids continue to elicit coughing, reportedly consistent with baseline, although pt describes her coughing is more significant than usual.  Nectar thick liquids were tolerated with no coughing episodes. She acknowledge a hx of GERD. We discussed the benefit of proceeding with an MBS while she is here - while she may have some baseline dysphagia, there is certainly an acute component related to this right CVA.  Recommend starting a dysphagia 2 diet with nectar thick liquids; meds whole in puree. SLP will follow for safety and MBS in the next few days.  D/W pt, husband, Therapist, sports.   HPI HPI: Patient is a 71 y.o. female with PMH: CAD, HTN, DM-2, COPD, GERD, lung nodules, tobacco dependency, ADD, stomach ulcers, tremor disorder, prior hemorrhagic stroke, HLD, thrombocytosis. She presented to the hospital on 12/19/21 with left sided weakness, slurred speech and left sided facial droop. Imaging showed moderately sized right MCA CVA including dense involvement of the right basal ganglia, insula and anterior operculum. She was out of window for intervention; some developing midline shift. She failed Yale swallow with RN and SLP ordered for swallow evaluation.      SLP Plan  Continue with current plan of care      Recommendations for follow up therapy are one component of a multi-disciplinary discharge planning process, led by the attending physician.  Recommendations may be updated based on patient status, additional  functional criteria and insurance authorization.    Recommendations  Diet recommendations: Dysphagia 2 (fine chop);Nectar-thick liquid Liquids provided via: Cup;Straw Medication Administration: Crushed with puree Supervision: Staff to assist with self feeding Compensations: Minimize environmental distractions Postural Changes and/or Swallow Maneuvers: Seated upright 90 degrees                Oral Care Recommendations: Oral care BID Follow Up Recommendations: Acute inpatient rehab (3hours/day) Assistance recommended at discharge: Frequent or constant Supervision/Assistance SLP Visit Diagnosis: Dysphagia, oropharyngeal phase (R13.12) Plan: Continue with current plan of care         Skylarr Liz L. Tivis Ringer, MA CCC/SLP Clinical Specialist - Acute Care SLP Acute Rehabilitation Services Office number [phone]   Juan Quam Laurice  12/21/2021, 1:06 PM

## 2021-12-22 ENCOUNTER — Inpatient Hospital Stay (HOSPITAL_COMMUNITY): Payer: Medicare Other

## 2021-12-22 DIAGNOSIS — I63311 Cerebral infarction due to thrombosis of right middle cerebral artery: Secondary | ICD-10-CM | POA: Diagnosis not present

## 2021-12-22 DIAGNOSIS — I639 Cerebral infarction, unspecified: Secondary | ICD-10-CM

## 2021-12-22 LAB — BASIC METABOLIC PANEL
Anion gap: 6 (ref 5–15)
BUN: 12 mg/dL (ref 8–23)
CO2: 24 mmol/L (ref 22–32)
Calcium: 8.8 mg/dL — ABNORMAL LOW (ref 8.9–10.3)
Chloride: 115 mmol/L — ABNORMAL HIGH (ref 98–111)
Creatinine, Ser: 0.43 mg/dL — ABNORMAL LOW (ref 0.44–1.00)
GFR, Estimated: >60 mL/min (ref >60)
Glucose, Bld: 113 mg/dL — ABNORMAL HIGH (ref 70–99)
Potassium: 4.1 mmol/L (ref 3.5–5.1)
Sodium: 145 mmol/L (ref 135–145)

## 2021-12-22 LAB — CBC
HCT: 43.2 % (ref 36.0–46.0)
Hemoglobin: 13.6 g/dL (ref 12.0–15.0)
MCH: 28.5 pg (ref 26.0–34.0)
MCHC: 31.5 g/dL (ref 30.0–36.0)
MCV: 90.4 fL (ref 80.0–100.0)
Platelets: 276 10*3/uL (ref 150–400)
RBC: 4.78 MIL/uL (ref 3.87–5.11)
RDW: 14 % (ref 11.5–15.5)
WBC: 4.9 10*3/uL (ref 4.0–10.5)
nRBC: 0 % (ref 0.0–0.2)

## 2021-12-22 MED ORDER — PROPRANOLOL HCL ER 60 MG PO CP24
60.0000 mg | ORAL_CAPSULE | Freq: Every day | ORAL | Status: DC
  Administered 2021-12-22 – 2021-12-25 (×4): 60 mg via ORAL
  Filled 2021-12-22 (×4): qty 1

## 2021-12-22 MED ORDER — AMPHETAMINE-DEXTROAMPHETAMINE 5 MG PO TABS: 20.0000 mg | ORAL_TABLET | Freq: Every morning | ORAL | Status: DC

## 2021-12-22 MED ORDER — HYDROXYZINE HCL 25 MG PO TABS
25.0000 mg | ORAL_TABLET | Freq: Every day | ORAL | Status: DC
  Administered 2021-12-22 – 2021-12-25 (×4): 25 mg via ORAL
  Filled 2021-12-22 (×4): qty 1

## 2021-12-22 MED ORDER — PANTOPRAZOLE SODIUM 40 MG PO TBEC
40.0000 mg | DELAYED_RELEASE_TABLET | Freq: Every day | ORAL | Status: DC
  Administered 2021-12-22 – 2021-12-25 (×4): 40 mg via ORAL
  Filled 2021-12-22 (×4): qty 1

## 2021-12-22 NOTE — Progress Notes (Signed)
PROGRESS NOTE    Lauren [name]  FUX:[id number] DOB: [birth year] DOA: 12/20/2021 PCP: Malachy Moan, MD   Brief Narrative:  71 year old woman with hx of CAD, HTN, DM2, tremor disorder, prior hemorrhagic stroke, HLD, thrombocytosis who presents after waking up falling out of bed with left sided weakness, slurred speech, and left facial droop.  Last seen normal last night ~8PM.  Unfortunately imaging shows completed R M1 MCA stroke and she is out of window for intervention.  Does have some developing midline shift so PCCM consulted for admission.   Assessment & Plan:   Principal Problem:   Acute ischemic stroke ([place])   Acute R MCA stroke with evidence of brain compression, recent h/o hemorrhagic stroke 5/23- -unfortunately outside window for any intervention  -Neurology previously following, signed off  -Continue PT OT speech evaluation  -Some improvement in symptoms per husband at bedside -Disposition pending safe p.o. tolerance and discharge - - Presenting exam: R gaze preference, L hemiplegia, L facial droop   COVID-19 -Initial testing 12/14/2021, has completed quarantine given her asymptomatic outpatient testing only requires 5 days of quarantine -No further indication for repeat testing, imaging or intervention   Chronic tremor-  -Continue propranolol   H/o perforated gastric ulcer  Continue home PPI  COPD without acute exacerbation Tobacco dependence, ongoing On Breztri PTA, follws with Dr. Alvin Critchley Continue bronchodilators currently on Brovana and Yupelri while in the hospital   H/o CAD, HTN and HLD  with prior PCI Continue to follow clinically -continue aspirin 81mg    Prior lung ca R side s/p SBRT , prior breast cancer  - s/p lumpectomy 2011   H/o Bipolar 1 and ADHD Pending speech evaluation, resume Seroquel, BuSpar, and Lamictal   DJD As needed Tylenol   H/o OSA CPAP offered  DVT prophylaxis: Holding given above, early ambulation Code Status:  Full Family Communication: Husband at bedside  Status is: Inpatient  Dispo: The patient is from: Home              Anticipated d/c is to: To be determined pending PT evaluation              Anticipated d/c date is: 24 to 48 hours pending clinical course              Patient currently not medically stable for discharge  Consultants:  Neuro, PCCM  Procedures:  None  Antimicrobials:  None  Subjective: No acute issues or events overnight denies nausea vomiting diarrhea constipation headache fevers chills or chest pain  Objective: Vitals:   12/22/21 0200 12/22/21 0300 12/22/21 0400 12/22/21 0500  BP: 110/78 123/81 109/70 139/86  Pulse: 78 87 70 80  Resp: (!) 26 (!) 31 (!) 31 (!) 27  Temp:   98.6 F (37 C)   TempSrc:   Oral   SpO2: 95% 94% 97% 99%  Weight:      Height:        Intake/Output Summary (Last 24 hours) at 12/22/2021 0732 Last data filed at 12/21/2021 2000 Gross per 24 hour  Intake 375.64 ml  Output 800 ml  Net -424.36 ml   Filed Weights   12/20/21 0755 12/20/21 0805  Weight: 44 kg 44 kg    Examination: General:  Pleasantly resting in bed, No acute distress. HEENT: Left-sided facial droop and ptosis. Neck:  Without mass or deformity.  Trachea is midline. Lungs:  Clear to auscultate bilaterally without rhonchi, wheeze, or rales. Heart:  Regular rate and rhythm.  Without murmurs,  rubs, or gallops. Abdomen:  Soft, nontender, nondistended.  Without guarding or rebound. Extremities: 4/5 strength left lower extremity, 3 out of 5 strength left upper extremity, grip strength and shoulder shrug intact Vascular:  Dorsalis pedis and posterior tibial pulses palpable bilaterally. Skin:  Warm and dry, no erythema, no ulcerations.   Data Reviewed: I have personally reviewed following labs and imaging studies  CBC: Recent Labs  Lab 12/20/21 0755 12/20/21 0757 12/21/21 0324 12/22/21 0313  WBC 6.5  --  4.0 4.9  NEUTROABS 4.2  --   --   --   HGB 14.6 15.3* 14.2  13.6  HCT 45.1 45.0 42.5 43.2  MCV 89.8  --  87.8 90.4  PLT 258  --  293 979   Basic Metabolic Panel: Recent Labs  Lab 12/20/21 0755 12/20/21 0757 12/20/21 1450 12/21/21 0324 12/21/21 1032 12/21/21 1626 12/21/21 2135 12/22/21 0313  NA 141 140   < > 145 145 146* 146* 145  K 4.0 3.9  --  3.4*  --   --   --  4.1  CL 107 106  --  111  --   --   --  115*  CO2 25  --   --  23  --   --   --  24  GLUCOSE 91 95  --  103*  --   --   --  113*  BUN 6* 8  --  6*  --   --   --  12  CREATININE 0.54 0.50  --  0.39*  --   --   --  0.43*  CALCIUM 8.7*  --   --  8.4*  --   --   --  8.8*   < > = values in this interval not displayed.   GFR: Estimated Creatinine Clearance: 46.1 mL/min (A) (by C-G formula based on SCr of 0.43 mg/dL (L)). Liver Function Tests: Recent Labs  Lab 12/20/21 0755  AST 26  ALT 23  ALKPHOS 84  BILITOT 0.8  PROT 6.2*  ALBUMIN 3.1*   No results for input(s): "LIPASE", "AMYLASE" in the last 168 hours. No results for input(s): "AMMONIA" in the last 168 hours. Coagulation Profile: Recent Labs  Lab 12/20/21 0755  INR 0.9   Cardiac Enzymes: No results for input(s): "CKTOTAL", "CKMB", "CKMBINDEX", "TROPONINI" in the last 168 hours. BNP (last 3 results) No results for input(s): "PROBNP" in the last 8760 hours. HbA1C: Recent Labs    12/20/21 0755  HGBA1C 5.8*   CBG: Recent Labs  Lab 12/20/21 0753 12/21/21 0905  GLUCAP 106* 97   Lipid Profile: Recent Labs    12/21/21 0324  CHOL 151  HDL 30*  LDLCALC 103*  TRIG 88  CHOLHDL 5.0   Thyroid Function Tests: No results for input(s): "TSH", "T4TOTAL", "FREET4", "T3FREE", "THYROIDAB" in the last 72 hours. Anemia Panel: No results for input(s): "VITAMINB12", "FOLATE", "FERRITIN", "TIBC", "IRON", "RETICCTPCT" in the last 72 hours. Sepsis Labs: No results for input(s): "PROCALCITON", "LATICACIDVEN" in the last 168 hours.  Recent Results (from the past 240 hour(s))  MRSA Next Gen by PCR, Nasal     Status:  None   Collection Time: 12/20/21 11:10 PM   Specimen: Nasal Mucosa; Nasal Swab  Result Value Ref Range Status   MRSA by PCR Next Gen NOT DETECTED NOT DETECTED Final    Comment: (NOTE) The GeneXpert MRSA Assay (FDA approved for NASAL specimens only), is one component of a comprehensive MRSA colonization surveillance program. It is  not intended to diagnose MRSA infection nor to guide or monitor treatment for MRSA infections. Test performance is not FDA approved in patients less than 4 years old. Performed at [facility], [place] [address], [place], [place] [address]          Radiology Studies: CT HEAD WO CONTRAST (5MM)  Result Date: 12/22/2021 CLINICAL DATA:  71 year old female code stroke presentation with right MCA infarct visible on presentation CT, distal M1 LV0 with reconstitution. EXAM: CT HEAD WITHOUT CONTRAST TECHNIQUE: Contiguous axial images were obtained from the base of the skull through the vertex without intravenous contrast. RADIATION DOSE REDUCTION: This exam was performed according to the departmental dose-optimization program which includes automated exposure control, adjustment of the mA and/or kV according to patient size and/or use of iterative reconstruction technique. COMPARISON:  Brain MRI both without and with contrast yesterday. Head CT 12/20/2021 and earlier. FINDINGS: Brain: Mild evolution of cytotoxic edema on CT since 12/20/2021. No hemorrhagic transformation. Compared to the presentation CT, mild mass effect has developed in the right hemisphere including trace leftward midline shift and partially effaced right lateral ventricle. But the extent of cytotoxic edema has not significantly changed. Left hemisphere and posterior fossa gray-white differentiation maintained. Basilar cisterns remain normal. Vascular: Calcified atherosclerosis at the skull base. Skull: No acute osseous abnormality identified. Sinuses/Orbits: Visualized paranasal sinuses and mastoids are  stable and well aerated. Other: Mild rightward gaze.  Stable scalp soft tissues. IMPRESSION: 1. Right MCA infarct with stable extent but evolved edema and mild mass effect since presentation. Trace leftward midline shift. No malignant hemorrhagic transformation. 2. No new intracranial abnormality. Electronically Signed   By: Genevie Ann M.D.   On: 12/22/2021 05:30   DG Chest Port 1 View  Result Date: 12/21/2021 CLINICAL DATA:  Stroke. EXAM: PORTABLE CHEST 1 VIEW COMPARISON:  CT scan of the chest Jul 30, 2021. Chest x-ray Aug 07, 2021. FINDINGS: A left upper lobe nodule similar in the interval. The spiculated nodule centrally in the right lung is not well assessed with chest x-ray imaging. Hyperinflation of the lungs. Blunting of the costophrenic angles may represent tiny effusions. No overt edema. Possible nipple shadow over the right base. The cardiomediastinal silhouette is stable. No pneumothorax. IMPRESSION: 1. Stable left upper lobe nodule. 2. The central spiculated nodule in the right mid lung on previous CT imaging is not well assessed on this study. 3. Possible tiny pleural effusions, particularly on the right. 4. Confluence of shadows versus nipple shadow over the right base. Recommend attention to this region on follow-up or repeat imaging with nipple markers. 5. Hyperinflation of the lungs. Electronically Signed   By: Dorise Bullion III M.D.   On: 12/21/2021 16:47   MR BRAIN W CONTRAST  Result Date: 12/21/2021 CLINICAL DATA:  Acute right MCA infarct. Order states brain metastases suspected. Personal history of lung and breast cancer. EXAM: MRI HEAD WITH CONTRAST TECHNIQUE: Multiplanar, multiecho pulse sequences of the brain and surrounding structures were obtained with intravenous contrast. CONTRAST:  4.4 mL Vueway COMPARISON:  MR head without contrast 12/21/2021 FINDINGS: Brain: The study is mildly degraded by patient motion. No pathologic enhancement is present. Cortical thickening and edema is  present in the anterior right frontal lobe and operculum consistent with the known infarct. T2 signal changes are present within the right caudate head and lentiform nucleus. IMPRESSION: 1. No pathologic enhancement to suggest metastatic disease to the brain. 2. Cortical thickening and edema in the anterior right frontal lobe and operculum  consistent with the known infarct. Electronically Signed   By: San Morelle M.D.   On: 12/21/2021 15:46   MR BRAIN WO CONTRAST  Result Date: 12/21/2021 CLINICAL DATA:  71 year old female code stroke presentation with right MCA infarct visible on presentation CT, distal M1 LV0 with reconstitution. EXAM: MRI HEAD WITHOUT CONTRAST TECHNIQUE: Multiplanar, multiecho pulse sequences of the brain and surrounding structures were obtained without intravenous contrast. COMPARISON:  CT head, CTA head and neck, CT Perfusion yesterday. Brain MRI 03/20/2021. FINDINGS: Brain: Confluent restricted diffusion in the right MCA territory anterior and middle divisions including dense involvement of the right basal ganglia, insula and anterior operculum. Associated cytotoxic edema with T2 and FLAIR hyperintensity, gyral swelling. The pattern is similar to the T-max > 4s perfusion appearance yesterday. Trace petechial hemorrhage in the superior right caudate (Heidelberg Classification 1A). No other hemorrhagic transformation. Mild mass effect on the right lateral ventricle with only trace leftward midline shift. Basilar cisterns remain patent. No contralateral left hemisphere or posterior fossa abnormal diffusion. But a small area of encephalomalacia in the left superior frontal gyrus which is new from last year is associated with dense new hemosiderin on SWI series 15, image 46. No edema or mass effect there. No ventriculomegaly, extra-axial collection. Cervicomedullary junction and pituitary are within normal limits. Vascular: Major intracranial vascular flow voids appear stable compared  to last year. Skull and upper cervical spine: Negative. Visualized bone marrow signal is within normal limits. Sinuses/Orbits: Stable and negative. Other: Mastoids remain clear. Visible scalp and face appear negative. IMPRESSION: 1. Confluent moderate sized right MCA infarct. Trace petechial hemorrhage in the right caudate. No other hemorrhagic transformation. No significant intracranial mass effect. 2. Chronic confluent hemosiderin with encephalomalacia in the posterolateral left superior frontal gyrus is new from the MRI last year. Electronically Signed   By: Genevie Ann M.D.   On: 12/21/2021 05:30   CT HEAD WO CONTRAST (5MM)  Result Date: 12/20/2021 CLINICAL DATA:  Stroke follow-up EXAM: CT HEAD WITHOUT CONTRAST TECHNIQUE: Contiguous axial images were obtained from the base of the skull through the vertex without intravenous contrast. RADIATION DOSE REDUCTION: This exam was performed according to the departmental dose-optimization program which includes automated exposure control, adjustment of the mA and/or kV according to patient size and/or use of iterative reconstruction technique. COMPARISON:  CT head 12/20/2021 at 8:04 a.m. FINDINGS: Brain: Similar cytotoxic edema in the right MCA territory affecting the right insula, lentiform nuclei, and internal capsule. No hemorrhagic transformation or midline shift. No evidence of new infarct. No hydrocephalus. The basal cisterns are patent. Vascular: Calcified atherosclerosis in the skull base. The previously hyperdense right M1 MCA appears less dense today. Skull: Normal. Negative for fracture or focal lesion. Sinuses/Orbits: No acute finding. Other: None. IMPRESSION: Similar right MCA territory infarct compared with CT head earlier today. No hemorrhagic transformation or midline shift. Electronically Signed   By: Placido Sou M.D.   On: 12/20/2021 21:52   ECHOCARDIOGRAM COMPLETE  Result Date: 12/20/2021    ECHOCARDIOGRAM REPORT   Patient Name:   Lauren [name] Date of Exam: 12/20/2021 Medical Rec #:  [id number]      Height:       60.0 in Accession #:    [id number]     Weight:       97.0 lb Date of Birth:  March 30, 1951     BSA:          1.372 m Patient Age:    35 years  BP:           134/88 mmHg Patient Gender: F              HR:           98 bpm. Exam Location:  Inpatient Procedure: 2D Echo, Cardiac Doppler and Color Doppler Indications:    Stroke I63.9  History:        Patient has no prior history of Echocardiogram examinations.                 Risk Factors:Diabetes and Hypertension.  Sonographer:    Bernadene Person RDCS Referring Phys: [id number] Candee Furbish  Sonographer Comments: Suboptimal parasternal window. Image acquisition challenging due to patient body habitus. IMPRESSIONS  1. Left ventricular ejection fraction, by estimation, is 50 to 55%. The left ventricle has low normal function. The left ventricle has no regional wall motion abnormalities. There is mild left ventricular hypertrophy. Left ventricular diastolic parameters are consistent with Grade I diastolic dysfunction (impaired relaxation).  2. Right ventricular systolic function is normal. The right ventricular size is normal. There is normal pulmonary artery systolic pressure. The estimated right ventricular systolic pressure is 85.8 mmHg.  3. The mitral valve is normal in structure. No evidence of mitral valve regurgitation. No evidence of mitral stenosis.  4. The aortic valve was not well visualized. Aortic valve regurgitation is not visualized. No aortic stenosis is present.  5. The inferior vena cava is normal in size with greater than 50% respiratory variability, suggesting right atrial pressure of 3 mmHg. FINDINGS  Left Ventricle: Left ventricular ejection fraction, by estimation, is 50 to 55%. The left ventricle has low normal function. The left ventricle has no regional wall motion abnormalities. The left ventricular internal cavity size was normal in size. There is mild left  ventricular hypertrophy. Left ventricular diastolic parameters are consistent with Grade I diastolic dysfunction (impaired relaxation). Right Ventricle: The right ventricular size is normal. No increase in right ventricular wall thickness. Right ventricular systolic function is normal. There is normal pulmonary artery systolic pressure. The tricuspid regurgitant velocity is 2.48 m/s, and  with an assumed right atrial pressure of 3 mmHg, the estimated right ventricular systolic pressure is 85.0 mmHg. Left Atrium: Left atrial size was normal in size. Right Atrium: Right atrial size was normal in size. Pericardium: Trivial pericardial effusion is present. Mitral Valve: The mitral valve is normal in structure. No evidence of mitral valve regurgitation. No evidence of mitral valve stenosis. Tricuspid Valve: The tricuspid valve is normal in structure. Tricuspid valve regurgitation is trivial. Aortic Valve: The aortic valve was not well visualized. Aortic valve regurgitation is not visualized. No aortic stenosis is present. Pulmonic Valve: The pulmonic valve was not well visualized. Pulmonic valve regurgitation is not visualized. Aorta: The aortic root and ascending aorta are structurally normal, with no evidence of dilitation. Venous: The inferior vena cava is normal in size with greater than 50% respiratory variability, suggesting right atrial pressure of 3 mmHg. IAS/Shunts: The interatrial septum was not well visualized.  LEFT VENTRICLE PLAX 2D LVIDd:         4.40 cm     Diastology LVIDs:         2.80 cm     LV e' medial:    6.12 cm/s LV PW:         0.90 cm     LV E/e' medial:  6.9 LV IVS:        0.70 cm  LV e' lateral:   8.51 cm/s LVOT diam:     1.90 cm     LV E/e' lateral: 5.0 LV SV:         51 LV SV Index:   37 LVOT Area:     2.84 cm  LV Volumes (MOD) LV vol d, MOD A2C: 87.5 ml LV vol d, MOD A4C: 69.0 ml LV vol s, MOD A2C: 45.4 ml LV vol s, MOD A4C: 33.6 ml LV SV MOD A2C:     42.1 ml LV SV MOD A4C:     69.0 ml LV  SV MOD BP:      40.8 ml RIGHT VENTRICLE RV S prime:     10.30 cm/s TAPSE (M-mode): 1.7 cm LEFT ATRIUM             Index        RIGHT ATRIUM          Index LA diam:        2.00 cm 1.46 cm/m   RA Area:     9.66 cm LA Vol (A2C):   28.3 ml 20.62 ml/m  RA Volume:   17.10 ml 12.46 ml/m LA Vol (A4C):   18.3 ml 13.33 ml/m LA Biplane Vol: 24.2 ml 17.63 ml/m  AORTIC VALVE LVOT Vmax:   98.50 cm/s LVOT Vmean:  66.500 cm/s LVOT VTI:    0.179 m  AORTA Ao Root diam: 3.10 cm Ao Asc diam:  3.20 cm MITRAL VALVE               TRICUSPID VALVE MV Area (PHT): 3.48 cm    TR Peak grad:   24.6 mmHg MV Decel Time: 218 msec    TR Vmax:        248.00 cm/s MV E velocity: 42.30 cm/s MV A velocity: 60.70 cm/s  SHUNTS MV E/A ratio:  0.70        Systemic VTI:  0.18 m                            Systemic Diam: 1.90 cm Oswaldo Milian MD Electronically signed by Oswaldo Milian MD Signature Date/Time: 12/20/2021/4:05:58 PM    Final    CT Cervical Spine Wo Contrast  Result Date: 12/20/2021 CLINICAL DATA:  71 year old female code stroke presentation. EXAM: CT CERVICAL SPINE WITH CONTRAST TECHNIQUE: Multiplanar CT images of the cervical spine were reconstructed from contemporary CTA of the Neck. RADIATION DOSE REDUCTION: This exam was performed according to the departmental dose-optimization program which includes automated exposure control, adjustment of the mA and/or kV according to patient size and/or use of iterative reconstruction technique. CONTRAST:  No additional COMPARISON:  CTA head and neck today reported separately. Cervical spine CT 11/17/2018. FINDINGS: Alignment: Chronic straightening of lower cervical lordosis. Cervicothoracic junction alignment is within normal limits. Bilateral posterior element alignment is within normal limits. Skull base and vertebrae: Visualized skull base is intact. No atlanto-occipital dissociation. Healed left anterior C1 ring fracture since 2020. Maintained C1-C2 alignment. Previously seen  left C6 and C7 superior articulating facet fractures have healed (series 3, image 64). No acute osseous abnormality identified. Soft tissues and spinal canal: No prevertebral fluid or swelling. No visible canal hematoma. Neck CTA findings reported separately. Disc levels: Intermittent cervical disc and endplate degeneration appears stable since 2020. Upper chest: Reported separately. IMPRESSION: 1. No acute osseous abnormality identified in the cervical spine. Healed left C1 ring, left C6 and C7 posterior element fractures since  2020. 2. CTA head and neck today reported separately. Electronically Signed   By: Genevie Ann M.D.   On: 12/20/2021 08:56   CT ANGIO HEAD NECK W WO CM W PERF (CODE STROKE)  Result Date: 12/20/2021 CLINICAL DATA:  Code stroke. 71 year old female. Known indolent left upper lobe lung carcinoma. EXAM: CT ANGIOGRAPHY HEAD AND NECK CT PERFUSION BRAIN TECHNIQUE: Multidetector CT imaging of the head and neck was performed using the standard protocol during bolus administration of intravenous contrast. Multiplanar CT image reconstructions and MIPs were obtained to evaluate the vascular anatomy. Carotid stenosis measurements (when applicable) are obtained utilizing NASCET criteria, using the distal internal carotid diameter as the denominator. Multiphase CT imaging of the brain was performed following IV bolus contrast injection. Subsequent parametric perfusion maps were calculated using RAPID software. RADIATION DOSE REDUCTION: This exam was performed according to the departmental dose-optimization program which includes automated exposure control, adjustment of the mA and/or kV according to patient size and/or use of iterative reconstruction technique. CONTRAST:  41mL OMNIPAQUE IOHEXOL 350 MG/ML SOLN COMPARISON:  Plain head CT 0805 hours today Chest CT 07/30/2021. FINDINGS: CT Brain Perfusion Findings: ASPECTS: 5 CBF (<30%) Volume: 0 by the standard criteria, and no CBV alterations, suggesting  subacute cytotoxic edema on the plain head CT. Perfusion (Tmax>6.0s) volume: 65mL by the standard criteria, up to 54 mL using T-max greater than 4 S. Mismatch Volume: Difficult to determine considering pseudonormalization of CBF and CBV. Suspect little salvageable penumbra given this constellation. Infarction Location:Right MCA CTA NECK Skeleton: Largely absent dentition. Mild mandible motion artifact. Mild cervical spine degeneration for age. No acute osseous abnormality identified. Upper chest: Emphysema and apical lung scarring. Known left upper lobe lung cancer is 12 mm on series 5, image 151, stable since 07/30/2021. Other neck: No acute finding. Aortic arch: Calcified aortic atherosclerosis. Entire arch not included. Right carotid system: Visible brachiocephalic artery with only mild plaque. Negative right CCA origin. Mild right CCA calcified plaque without stenosis. Right ICA origin calcified plaque with up to 64 % stenosis with respect to the distal vessel. Additional right ICA bulb calcified plaque with lesser stenosis. Left carotid system: Left CCA origin not included. Mild left CCA plaque proximal to the bifurcation. Bulky calcified plaque at the left ICA origin. 50% stenosis at the left ICA origin. Less pronounced calcified plaque at the left ICA bulb. Vertebral arteries: Proximal right subclavian artery plaque without stenosis. Minimal plaque at the right vertebral artery origin without stenosis. Right vertebral artery is mildly dominant and patent to the skull base without stenosis. Bulky proximal left subclavian artery plaque with up to 72 % stenosis with respect to the distal vessel series 5, image 167. Plaque near the left vertebral artery origin but only mild origin stenosis. Left vertebral artery is mildly non dominant with additional V1 and V2 calcified plaque but remains patent to the skull base with no significant stenosis. CTA HEAD Posterior circulation: Mild bilateral distal vertebral artery  calcified plaque without stenosis. Patent PICA origins and vertebrobasilar junction. Patent basilar artery without stenosis. Patent SCA and PCA origins. Posterior communicating arteries are diminutive or absent. Bilateral PCA branches are within normal limits. Anterior circulation: Both ICA siphons are patent with calcified plaque. Only mild bilateral siphon stenosis. Patent carotid termini, MCA and ACA origins. Anterior communicating artery and bilateral ACA branches are within normal limits. Left MCA M1 segment and bifurcation are patent without stenosis. Left MCA branches are within normal limits. Right MCA origin is patent. The right M1 is  occluded on series 12, image 23 about 10 mm from its origin proximal to the bifurcation. However, the bifurcation and MCA branches are moderately reconstituted as seen on series 11, image 16. Venous sinuses: Grossly patent. Anatomic variants: Mildly dominant right vertebral artery. Review of the MIP images confirms the above findings Salient findings discussed by telephone with Dr. Kerney Elbe on 12/20/2021 at 0824 hours. IMPRESSION: 1. Positive for Emergent Large Vessel Occlusion of the Right MCA mid M1 segment, but pseudo-normalization of the Right MCA infarct on both CBF and CBV. Combined with ASPECTS of 5 and moderately reconstituted appearance of the right MCA branches this constellation does not seem favorable for endovascular reperfusion. These findings discussed by telephone with Dr. Kerney Elbe on 12/20/2021 at 0824 hours. 2. Known left upper lobe indolent lung cancer appears stable since chest CT 07/30/2021, 12 mm. Emphysema (ICD10-J43.9). 3. Aortic Atherosclerosis (ICD10-I70.0) and atherosclerosis in the neck and at the skull base. Significant stenoses: - Right ICA origin, 64%. - proximal Left Subclavian Artery, 72%. Electronically Signed   By: Genevie Ann M.D.   On: 12/20/2021 08:42   CT HEAD CODE STROKE WO CONTRAST  Result Date: 12/20/2021 CLINICAL DATA:  Code  stroke.  71 year old female EXAM: CT HEAD WITHOUT CONTRAST TECHNIQUE: Contiguous axial images were obtained from the base of the skull through the vertex without intravenous contrast. RADIATION DOSE REDUCTION: This exam was performed according to the departmental dose-optimization program which includes automated exposure control, adjustment of the mA and/or kV according to patient size and/or use of iterative reconstruction technique. COMPARISON:  Brain MRI 03/20/2021. Head CT 09/17/2018. FINDINGS: Brain: Cytotoxic edema in the right MCA territory (series 2, image 22). No hemorrhagic transformation or midline shift. M1 and M2 segments affected. Right insula, lentiform and internal capsule affected. Elsewhere gray-white matter differentiation appears normal. Basilar cisterns are preserved. No ventriculomegaly. Vascular: Calcified atherosclerosis at the skull base. Hyperdense right MCA M1 on series 5, image 21. Skull: No acute osseous abnormality identified. Sinuses/Orbits: Visualized paranasal sinuses and mastoids are stable and well aerated. Other: Rightward gaze. Visualized scalp soft tissues are within normal limits. ASPECTS [facility] Stroke Program Early CT Score) Total score (0-10 with 10 being normal): 5, as above IMPRESSION: 1. Positive for right MCA cytotoxic edema and hyperdense Right MCA compatible with ELVO. ASPECTS five. 2. No associated hemorrhage or midline shift. 3. These results were communicated to Dr. Cheral Marker at 8:22 am on 12/20/2021 by text page via the [facility] messaging system. Electronically Signed   By: Genevie Ann M.D.   On: 12/20/2021 08:23    Scheduled Meds:  arformoterol  15 mcg Nebulization BID   aspirin EC  81 mg Oral Daily   atorvastatin  40 mg Oral QHS   busPIRone  30 mg Oral TID   Chlorhexidine Gluconate Cloth  6 each Topical Daily   lamoTRIgine  200 mg Oral Daily   pantoprazole (PROTONIX) IV  40 mg Intravenous QHS   QUEtiapine  200 mg Oral QHS   revefenacin  175 mcg Nebulization  Daily   sodium chloride flush  3 mL Intravenous Once   Continuous Infusions:   LOS: 2 days   Time spent: 76min  Annasofia Pohl C Silva Aamodt, DO [facility]  If 7PM-7AM, please contact night-coverage [email or web address]  12/22/2021, 7:32 AM

## 2021-12-22 NOTE — Progress Notes (Signed)
Inpatient Rehab Admissions Coordinator:  ? ?Per therapy recommendations,  patient was screened for CIR candidacy by Symiah Nowotny, MS, CCC-SLP. At this time, Pt. Appears to be a a potential candidate for CIR. I will place   order for rehab consult per protocol for full assessment. Please contact me any with questions. ? ?Trevian Hayashida, MS, CCC-SLP ?Rehab Admissions Coordinator  ?[phone] (celll) ?[phone] (office) ? ?

## 2021-12-22 NOTE — Evaluation (Signed)
Occupational Therapy Evaluation Patient Details Name: Lauren [name] MRN: [id number] DOB: Mar 03, 1951 Today's Date: 12/22/2021   History of Present Illness The pt is a 71 yo female presenting 9/22 with L-sided weakness, L facial droop, and R sided gaze. Imaging showed R MCA LVO, but outside window for intervention. PMH includes: hemorrhagic stroke in May 2023, HLD, DM II, sleep apnea, COPD, CAD, breast cancer s/p radiation therapy, ADHD, major depressive disorder, GAD, Bipolar 1 Disorder, and tobacco use.   Clinical Impression   Lauren [name] was evaluated s/p the above admission list, per report she is generally mod I at baseline with intermittent use of a quad cane. Pt had full functional recovery from CVA 07/2021 and finished her therapies (R side deficits). Upon evaluation pt now presents with L hemibody sensory motor, ROM and strength deficits, expressive communication difficulties, impaired cognition, decreased balance and activity tolerance. Overall she required min A +2 for transfers and short ambulation. Due to deficits listed below, she required min A for UB ADLs and up to mod A for LB ADLs. OT to continue to follow acutely. Recommend d/c to AIR for maximal functional recovery to her indep baseline.      Recommendations for follow up therapy are one component of a multi-disciplinary discharge planning process, led by the attending physician.  Recommendations may be updated based on patient status, additional functional criteria and insurance authorization.   Follow Up Recommendations  Acute inpatient rehab (3hours/day)    Assistance Recommended at Discharge Frequent or constant Supervision/Assistance  Patient can return home with the following A little help with walking and/or transfers;A little help with bathing/dressing/bathroom;Assistance with cooking/housework;Direct supervision/assist for medications management;Direct supervision/assist for financial management;Assist for transportation;Help  with stairs or ramp for entrance    Functional Status Assessment  Patient has had a recent decline in their functional status and demonstrates the ability to make significant improvements in function in a reasonable and predictable amount of time.  Equipment Recommendations  Other (comment) (TBD)    Recommendations for Other Services Rehab consult     Precautions / Restrictions Precautions Precautions: Fall Precaution Comments: covid+ Restrictions Weight Bearing Restrictions: No      Mobility Bed Mobility Overal bed mobility: Needs Assistance Bed Mobility: Supine to Sit     Supine to sit: Min assist          Transfers Overall transfer level: Needs assistance Equipment used: Rolling walker (2 wheels), 2 person hand held assist Transfers: Sit to/from Stand Sit to Stand: Min assist, +2 safety/equipment           General transfer comment: HHA min A +2 for initial sit<>stand and short ambualtion to chair; min A with RW as well. Benefits from cues, needed assist to place LUE      Balance Overall balance assessment: Needs assistance Sitting-balance support: Feet supported Sitting balance-Leahy Scale: Fair     Standing balance support: Bilateral upper extremity supported, During functional activity Standing balance-Leahy Scale: Poor                             ADL either performed or assessed with clinical judgement   ADL Overall ADL's : Needs assistance/impaired Eating/Feeding: Minimal assistance;Sitting   Grooming: Set up;Supervision/safety;Sitting   Upper Body Bathing: Minimal assistance;Sitting   Lower Body Bathing: Moderate assistance;Sit to/from stand   Upper Body Dressing : Minimal assistance;Sitting   Lower Body Dressing: Moderate assistance;Sit to/from stand Lower Body Dressing Details (indicate cue type and reason): needed  assist for L sock and significantly increased time Toilet Transfer: Minimal assistance;Ambulation;Rolling walker  (2 wheels);BSC/3in1   Toileting- Clothing Manipulation and Hygiene: Min guard;Sitting/lateral lean       Functional mobility during ADLs: Minimal assistance;Rolling walker (2 wheels);Cueing for sequencing;Cueing for safety General ADL Comments: limited by L hemibody weakness, new O2 needs, expressive communication deficit     Vision Baseline Vision/History: 1 Wears glasses Ability to See in Adequate Light: 0 Adequate Patient Visual Report: Blurring of vision Vision Assessment?: Vision impaired- to be further tested in functional context Additional Comments: needs further assessment, pt reports new blurry vision. Unable to detail if visual changes are new or old. General assessment was [facility]     Perception     Praxis      Pertinent Vitals/Pain Pain Assessment Pain Assessment: No/denies pain     Hand Dominance Left   Extremity/Trunk Assessment Upper Extremity Assessment Upper Extremity Assessment: RUE deficits/detail;LUE deficits/detail RUE Deficits / Details: generally weak but ROM is WFL. Per pt and family, pt had full return of R hemibody since CVA in 05/23 RUE Sensation: WNL RUE Coordination: WNL LUE Deficits / Details: Overall 3/5 MMT. FUll PROM but able to move actively through about 50% of range. Able to hold shoulder flexion against gravity for ~5 seconds without drift. Needed cues to use functionally to don sock and use on RW LUE Sensation: decreased light touch;decreased proprioception LUE Coordination: decreased fine motor;decreased gross motor   Lower Extremity Assessment Lower Extremity Assessment: Defer to PT evaluation   Cervical / Trunk Assessment Cervical / Trunk Assessment: Kyphotic (mild)   Communication Communication Communication: Expressive difficulties (one word responses, able to string some simple sentences together)   Cognition Arousal/Alertness: Awake/alert Behavior During Therapy: Flat affect Overall Cognitive Status: Impaired/Different from  baseline Area of Impairment: Attention, Following commands, Safety/judgement, Awareness, Problem solving                   Current Attention Level: Sustained   Following Commands: Follows one step commands consistently Safety/Judgement: Decreased awareness of deficits Awareness: Emergent Problem Solving: Slow processing, Requires verbal cues General Comments: slow processing, benefits from simple commands with increased time. Difficulty wiht multistep directions. Unable to dual task.     General Comments  VSS on 4L during session, bumped to 2L at the end of the session while sitting in the chair with SpO2 >90%    Exercises     Shoulder Instructions      Home Living Family/patient expects to be discharged to:: Private residence Living Arrangements: Spouse/significant other Available Help at Discharge: Available 24 hours/day Type of Home: House Home Access: Stairs to enter [employer] of Steps: 1+1 carport Entrance Stairs-Rails: None Home Layout: Two level Alternate Level Stairs-Number of Steps: flight. Full bath in Basement. Bath on main level is being renovated Alternate Level Stairs-Rails: Right Bathroom Shower/Tub: Occupational psychologist: Handicapped height     Home Equipment: Conservation officer, nature (2 wheels);Cane - quad;Grab bars - toilet;Tub bench      Lives With: Spouse    Prior Functioning/Environment Prior Level of Function : Independent/Modified Independent             Mobility Comments: quad cane as needed ADLs Comments: mod I        OT Problem List: Decreased strength;Decreased range of motion;Decreased activity tolerance;Impaired balance (sitting and/or standing);Decreased coordination;Decreased cognition;Decreased safety awareness;Decreased knowledge of use of DME or AE;Decreased knowledge of precautions;Impaired sensation;Impaired UE functional use  OT Treatment/Interventions: Self-care/ADL training;Therapeutic  exercise;DME and/or AE instruction;Therapeutic activities;Patient/family education;Balance training;Neuromuscular education    OT Goals(Current goals can be found in the care plan section) Acute Rehab OT Goals Patient Stated Goal: back to indep OT Goal Formulation: With patient Time For Goal Achievement: 01/05/22 Potential to Achieve Goals: Good ADL Goals Pt Will Perform Grooming: with modified independence;standing Pt Will Perform Upper Body Dressing: with modified independence;sitting Pt Will Perform Lower Body Dressing: with min guard assist;sit to/from stand Pt Will Transfer to Toilet: with min guard assist;ambulating;grab bars Additional ADL Goal #1: Pt will independently use LUE as a functional assist to complete ADLs  OT Frequency: Min 2X/week    Co-evaluation              AM-PAC OT "6 Clicks" Daily Activity     Outcome Measure Help from another person eating meals?: A Little Help from another person taking care of personal grooming?: A Little Help from another person toileting, which includes using toliet, bedpan, or urinal?: A Little Help from another person bathing (including washing, rinsing, drying)?: A Lot Help from another person to put on and taking off regular upper body clothing?: A Little Help from another person to put on and taking off regular lower body clothing?: A Lot 6 Click Score: 16   End of Session Equipment Utilized During Treatment: Gait belt;Rolling walker (2 wheels);Oxygen Nurse Communication: Mobility status  Activity Tolerance: Patient tolerated treatment well Patient left: in chair;with call bell/phone within reach;with chair alarm set;with family/visitor present  OT Visit Diagnosis: Unsteadiness on feet (R26.81);Other abnormalities of gait and mobility (R26.89);Muscle weakness (generalized) (M62.81);Hemiplegia and hemiparesis Hemiplegia - Right/Left: Left Hemiplegia - dominant/non-dominant: Dominant Hemiplegia - caused by: Cerebral  infarction                Time: 7412-8786 OT Time Calculation (min): 36 min Charges:  OT General Charges $OT Visit: 1 Visit OT Evaluation $OT Eval Moderate Complexity: 1 Mod    Lauren [name] 12/22/2021, 9:49 AM

## 2021-12-22 NOTE — Evaluation (Signed)
Physical Therapy Evaluation Patient Details Name: Lauren [name] MRN: [id number] DOB: July 10, 1950 Today's Date: 12/22/2021  History of Present Illness  The pt is a 71 yo female presenting 9/22 with L-sided weakness, L facial droop, and R sided gaze. Imaging showed R MCA LVO, but outside window for intervention. PMH includes: hemorrhagic stroke in May 2023, HLD, DM II, sleep apnea, COPD, CAD, breast cancer s/p radiation therapy, ADHD, major depressive disorder, GAD, Bipolar 1 Disorder, and tobacco use.   Clinical Impression  Pt in bed upon arrival of PT, agreeable to evaluation at this time. Prior to admission the pt was independent with only intermittent use of quad cane for mobility at home. Per pt and spouse, she had returned to full independence without residual deficits after stroke in May 2023. The pt now presents with limitations in functional mobility, strength, proprioception, stability, and endurance due to above dx, and will continue to benefit from skilled PT to address these deficits. The pt required minA and increased cues to complete bed mobility, and minA of 2 to complete initial stand due to weakness and poor functional stability in standing. The pt took steps with both bilateral HHA and with use of RW, improved stability with RW but the pt continues to rely on increased cues and assist to maintain stability and appropriately move and position LLE at this time. Given independence and current deficits, recommend acute inpatient rehab when medically ready for d/c.         Recommendations for follow up therapy are one component of a multi-disciplinary discharge planning process, led by the attending physician.  Recommendations may be updated based on patient status, additional functional criteria and insurance authorization.  Follow Up Recommendations Acute inpatient rehab (3hours/day)      Assistance Recommended at Discharge    Patient can return home with the following  A lot of  help with walking and/or transfers;A lot of help with bathing/dressing/bathroom;Assistance with cooking/housework;Direct supervision/assist for medications management;Direct supervision/assist for financial management;Assist for transportation;Help with stairs or ramp for entrance    Equipment Recommendations None recommended by PT  Recommendations for Other Services  Rehab consult    Functional Status Assessment Patient has had a recent decline in their functional status and demonstrates the ability to make significant improvements in function in a reasonable and predictable amount of time.     Precautions / Restrictions Precautions Precautions: Fall Precaution Comments: covid+, on 2L O2 at eval Restrictions Weight Bearing Restrictions: No      Mobility  Bed Mobility Overal bed mobility: Needs Assistance Bed Mobility: Supine to Sit     Supine to sit: Min assist     General bed mobility comments: minA, pt able to come to long-sitting without assist, minA with cues and increased time to come to EOB    Transfers Overall transfer level: Needs assistance Equipment used: Rolling walker (2 wheels), 2 person hand held assist Transfers: Sit to/from Stand Sit to Stand: Min assist, +2 safety/equipment           General transfer comment: HHA min A +2 for initial sit<>stand and short ambualtion to chair; min A with RW as well. Benefits from cues, needed assist to place LUE    Ambulation/Gait Ambulation/Gait assistance: Min assist, +2 safety/equipment Gait Distance (Feet): 5 Feet (+ 75ft) Assistive device: 2 person hand held assist, Rolling walker (2 wheels) Gait Pattern/deviations: Step-through pattern, Knee hyperextension - left, Decreased step length - left, Decreased stride length Gait velocity: decreased Gait velocity interpretation: <1.31 ft/sec, indicative  of household ambulator   General Gait Details: pt with poor functional use and movement of LLE with gait. increased  cues to advance appropriately and cues for increased clearance. improved stability with use of RW, VSS on 2L with gait  Modified Rankin (Stroke Patients Only) Modified Rankin (Stroke Patients Only) Pre-Morbid Rankin Score: Slight disability Modified Rankin: Moderately severe disability     Balance Overall balance assessment: Needs assistance Sitting-balance support: Feet supported Sitting balance-Leahy Scale: Fair     Standing balance support: Bilateral upper extremity supported, During functional activity Standing balance-Leahy Scale: Poor                               Pertinent Vitals/Pain Pain Assessment Pain Assessment: No/denies pain    Home Living Family/patient expects to be discharged to:: Private residence Living Arrangements: Spouse/significant other Available Help at Discharge: Available 24 hours/day Type of Home: House Home Access: Stairs to enter Entrance Stairs-Rails: None Entrance Stairs-Number of Steps: 1+1 carport Alternate Level Stairs-Number of Steps: flight. Full bath in Basement. Bath on main level is being renovated Home Layout: [place]: Conservation officer, nature (2 wheels);Cane - quad;Grab bars - toilet;Tub bench      Prior Function Prior Level of Function : Independent/Modified Independent             Mobility Comments: quad cane as needed, single fall since d/c in May ADLs Comments: mod I     Hand Dominance   Dominant Hand: Left    Extremity/Trunk Assessment   Upper Extremity Assessment Upper Extremity Assessment: Defer to OT evaluation RUE Deficits / Details: generally weak but ROM is WFL. Per pt and family, pt had full return of R hemibody since CVA in 05/23 RUE Sensation: WNL RUE Coordination: WNL LUE Deficits / Details: Overall 3/5 MMT. FUll PROM but able to move actively through about 50% of range. Able to hold shoulder flexion against gravity for ~5 seconds without drift. Needed cues to use functionally to don  sock and use on RW LUE Sensation: decreased light touch;decreased proprioception LUE Coordination: decreased fine motor;decreased gross motor    Lower Extremity Assessment Lower Extremity Assessment: RLE deficits/detail;LLE deficits/detail RLE Deficits / Details: grossly WFL to MMT in bed, able to use functionally for OOB mobility. reports sensation intact after last stroke RLE Sensation: WNL RLE Coordination: WNL LLE Deficits / Details: grossly functional to MMT in bed, good ability to move against gravity with direct cues, but poor functional use. sensation impaired compared to RLE LLE Sensation: decreased light touch LLE Coordination: decreased fine motor;decreased gross motor    Cervical / Trunk Assessment Cervical / Trunk Assessment: Kyphotic (mild)  Communication   Communication: Expressive difficulties (one word responses, able to string some simple sentences together)  Cognition Arousal/Alertness: Awake/alert Behavior During Therapy: Flat affect Overall Cognitive Status: Impaired/Different from baseline Area of Impairment: Attention, Following commands, Safety/judgement, Awareness, Problem solving                   Current Attention Level: Sustained   Following Commands: Follows one step commands consistently Safety/Judgement: Decreased awareness of deficits Awareness: Emergent Problem Solving: Slow processing, Requires verbal cues General Comments: slow processing, benefits from simple commands with increased time. Difficulty wiht multistep directions. Unable to dual task.        General Comments General comments (skin integrity, edema, etc.): VSS on 4L upon arrival, RN present and confirmed decrease to 2L as SpO2 96%. Pt  maintained >90%    Exercises     Assessment/Plan    PT Assessment Patient needs continued PT services  PT Problem List Decreased strength;Decreased range of motion;Decreased activity tolerance;Decreased balance;Decreased mobility;Decreased  coordination;Decreased cognition;Decreased knowledge of use of DME;Decreased safety awareness;Impaired sensation       PT Treatment Interventions DME instruction;Gait training;Stair training;Functional mobility training;Therapeutic activities;Therapeutic exercise;Balance training;Neuromuscular re-education;Cognitive remediation;Patient/family education    PT Goals (Current goals can be found in the Care Plan section)  Acute Rehab PT Goals Patient Stated Goal: return to independence PT Goal Formulation: With patient Time For Goal Achievement: 01/05/22 Potential to Achieve Goals: Good    Frequency Min 4X/week        AM-PAC PT "6 Clicks" Mobility  Outcome Measure Help needed turning from your back to your side while in a flat bed without using bedrails?: A Little Help needed moving from lying on your back to sitting on the side of a flat bed without using bedrails?: A Little Help needed moving to and from a bed to a chair (including a wheelchair)?: A Lot Help needed standing up from a chair using your arms (e.g., wheelchair or bedside chair)?: A Lot Help needed to walk in hospital room?: Total (limited to <20 ft) Help needed climbing 3-5 steps with a railing? : A Lot 6 Click Score: 13    End of Session Equipment Utilized During Treatment: Gait belt;Oxygen Activity Tolerance: Patient tolerated treatment well Patient left: in chair;with call bell/phone within reach;with chair alarm set;with family/visitor present Nurse Communication: Mobility status PT Visit Diagnosis: Unsteadiness on feet (R26.81);Other abnormalities of gait and mobility (R26.89);Muscle weakness (generalized) (M62.81);Hemiplegia and hemiparesis Hemiplegia - Right/Left: Left Hemiplegia - dominant/non-dominant: Dominant Hemiplegia - caused by: Cerebral infarction    Time: 1655-3748 PT Time Calculation (min) (ACUTE ONLY): 34 min   Charges:   PT Evaluation $PT Eval Moderate Complexity: 1 Mod          [place], PT, DPT   Acute Rehabilitation Department  Sandra Cockayne 12/22/2021, 11:47 AM

## 2021-12-22 NOTE — Progress Notes (Signed)
STROKE TEAM PROGRESS NOTE   INTERVAL HISTORY Her husband is at the bedside.  Patient is sitting up in the bedside chair.  She is wide awake alert.  She has only mild left hemiparesis.  Hypertonic saline drip is being tapered.  She has no complaints MRI scan of the brain with contrast shows no evidence of metastasis. Vitals:   12/22/21 0900 12/22/21 1000 12/22/21 1100 12/22/21 1200  BP: 126/86 122/69 (!) 135/99   Pulse: 85 61 79   Resp: (!) 26 (!) 23 (!) 22   Temp:    98 F (36.7 C)  TempSrc:    Oral  SpO2: 96% 96% 96%   Weight:      Height:       CBC:  Recent Labs  Lab 12/20/21 0755 12/20/21 0757 12/21/21 0324 12/22/21 0313  WBC 6.5  --  4.0 4.9  NEUTROABS 4.2  --   --   --   HGB 14.6   < > 14.2 13.6  HCT 45.1   < > 42.5 43.2  MCV 89.8  --  87.8 90.4  PLT 258  --  293 276   < > = values in this interval not displayed.   Basic Metabolic Panel:  Recent Labs  Lab 12/21/21 0324 12/21/21 1032 12/21/21 2135 12/22/21 0313  NA 145   < > 146* 145  K 3.4*  --   --  4.1  CL 111  --   --  115*  CO2 23  --   --  24  GLUCOSE 103*  --   --  113*  BUN 6*  --   --  12  CREATININE 0.39*  --   --  0.43*  CALCIUM 8.4*  --   --  8.8*   < > = values in this interval not displayed.   Lipid Panel:  Recent Labs  Lab 12/21/21 0324  CHOL 151  TRIG 88  HDL 30*  CHOLHDL 5.0  VLDL 18  LDLCALC 103*   HgbA1c:  Recent Labs  Lab 12/20/21 0755  HGBA1C 5.8*   Urine Drug Screen: No results for input(s): "LABOPIA", "COCAINSCRNUR", "LABBENZ", "AMPHETMU", "THCU", "LABBARB" in the last 168 hours.  Alcohol Level  Recent Labs  Lab 12/20/21 0755  ETH <10    IMAGING past 24 hours CT HEAD WO CONTRAST (5MM)  Result Date: 12/22/2021 CLINICAL DATA:  71 year old female code stroke presentation with right MCA infarct visible on presentation CT, distal M1 LV0 with reconstitution. EXAM: CT HEAD WITHOUT CONTRAST TECHNIQUE: Contiguous axial images were obtained from the base of the skull  through the vertex without intravenous contrast. RADIATION DOSE REDUCTION: This exam was performed according to the departmental dose-optimization program which includes automated exposure control, adjustment of the mA and/or kV according to patient size and/or use of iterative reconstruction technique. COMPARISON:  Brain MRI both without and with contrast yesterday. Head CT 12/20/2021 and earlier. FINDINGS: Brain: Mild evolution of cytotoxic edema on CT since 12/20/2021. No hemorrhagic transformation. Compared to the presentation CT, mild mass effect has developed in the right hemisphere including trace leftward midline shift and partially effaced right lateral ventricle. But the extent of cytotoxic edema has not significantly changed. Left hemisphere and posterior fossa gray-white differentiation maintained. Basilar cisterns remain normal. Vascular: Calcified atherosclerosis at the skull base. Skull: No acute osseous abnormality identified. Sinuses/Orbits: Visualized paranasal sinuses and mastoids are stable and well aerated. Other: Mild rightward gaze.  Stable scalp soft tissues. IMPRESSION: 1. Right MCA infarct with  stable extent but evolved edema and mild mass effect since presentation. Trace leftward midline shift. No malignant hemorrhagic transformation. 2. No new intracranial abnormality. Electronically Signed   By: Genevie Ann M.D.   On: 12/22/2021 05:30    PHYSICAL EXAM  Physical Exam  Constitutional: Appears frail malnourished looking middle-aged Caucasian lady Cardiovascular: Normal rate and regular rhythm.  Respiratory: Effort normal, non-labored breathing  Neuro: Mental Status: Awake and oriented.  Marland Kitchen Speech is dysarthric but fluent with intact naming for common words. Comprehension intact for basic commands.  Cranial Nerves: II: Visual fields intact when testing each eye individually, but with extinction on the left to DSS. PERRL.   III,IV, VI: No ptosis. Eyes deviated to the right. Can cross  midline to the left with significant difficulty.  V: No sensation to FT or pinch on the left.  VII: Severe left facial droop to lower quadrant.  VIII: Hearing intact to voice IX,X: No hypophonia or hoarseness XI: Weak on the left XII: Tongue deviates to the left Motor: RUE and RLE 5/5 LUE flaccid tone with  3/5 strength proximally and distally. LLE 4/5 proximally and distally Sensory: No sensation to FT or pinch on the left.  Gait: Unable to assess    ASSESSMENT/PLAN [name] is a 71 y.o. female with history of CAD, HTN, DM2, tremor disorder, prior hemorrhagic stroke, HLD, thrombocytosis who presents after waking up falling out of bed with left sided weakness, slurred speech, and left facial droop presenting with left side weakness.  Stroke:  Right MCA infarct Etiology:  in the setting of intracranial large vessel atherosclerotic  disease and uncontrolled risk factors    Code Stroke CT head Positive for right MCA cytotoxic edema and hyperdense Right MCA compatible with ELVO. ASPECTS five. CTA head & neck with perfusion- Positive for Emergent Large Vessel Occlusion of the Right MCA mid M1 segment, but pseudo-normalization of the Right MCA infarct on both CBF and CBV. Combined with ASPECTS of 5 and moderately reconstituted appearance of the right MCA branches this constellation does not seem favorable for endovascular reperfusion. Significant stenoses: Right ICA origin, 64%. Proximal Left Subclavian Artery, 72%. MRI  Confluent moderate sized right MCA infarct. Trace petechial hemorrhage in the right caudate. No other hemorrhagic transformation. No significant intracranial mass effect. Chronic confluent hemosiderin with encephalomalacia in the posterolateral left superior frontal gyrus is new from the MRI last year. MRI with Contrast- no enhancement to suggest metastatic disease to the brain. 2D Echo EF 50-55% LDL 103 HgbA1c 5.8 VTE prophylaxis - SCDs    Diet   DIET DYS 2 Room  service appropriate? Yes; Fluid consistency: Nectar Thick   No antithrombotic prior to admission, now on aspirin 81 mg daily.  Therapy recommendations:  Acute inpatient rehab Disposition:  pending  Hypertension Home meds: none Stable Permissive hypertension (OK if < 220/120) but gradually normalize in 5-7 days Long-term BP goal normotensive  Hyperlipidemia Home meds:  Atorvastatin 40mg , resumed in hospital LDL 103, goal < 70 Add Atorvastatin 40mg   High intensity statin not indicated  Continue statin at discharge  Other Stroke Risk Factors Advanced Age >/= 35  Obesity, Body mass index is 18.94 kg/m., BMI >/= 30 associated with increased stroke risk, recommend weight loss, diet and exercise as appropriate  Cigarette smoker, advised to stop smoking Hx stroke/TIA 07/2021 spontaneous, nontraumatic 7.5 cc left frontoparietal superficial intracerebral hemorrhage with unknown etiology, seen [facility] Residual right lower extremity weakness Coronary artery disease [facility]- Dr. Marijo File Recommend ASA  81mg  Obstructive sleep apnea- does not use CPAP  Other Active Problems Hx of cancer - Lung nodule, Breast cancer, Parotid lesions Currently following with [facility] No signs of metastatic CA on post contrast brain MRI   Essential tremors in upper extremities and head- seen 11/20/2021 by [facility] Neurology  Recommended propanolol 60mg  Bipolar disorder Seroquel, lamictal, trazodone, buspar   Hospital day # 2  Patient presented with dysarthria and left hemiplegia due to large right MCA infarct unfortunately presented outside time window for thrombolysis or thrombectomy due to low aspect score of 5.  She has mild subarachnoid edema but since CT angiogram shows patency of the MCA is unlikely to get worse.  Mobilize out of bed.  Continue ongoing physical occupational therapy and she will likely need inpatient rehab.  Transfer to neurology floor bed.  Discontinue hypertonic saline.  Start aspirin for  now but will likely need dual antiplatelet therapy at discharge and aggressive risk factor modification.  Long discussion with patient and husband at the bedside and answered questions.  This patient is critically ill and at significant risk of neurological worsening, death and care requires constant monitoring of vital signs, hemodynamics,respiratory and cardiac monitoring, extensive review of multiple databases, frequent neurological assessment, discussion with family, other specialists and medical decision making of high complexity.I have made any additions or clarifications directly to the above note.This critical care time does not reflect procedure time, or teaching time or supervisory time of PA/NP/Med Resident etc but could involve care discussion time.  I spent 30 minutes of neurocritical care time  in the care of  this patient.      Antony Contras, MD Medical Director [facility] Pager: [phone] 12/22/2021 4:53 PM   To contact Stroke Continuity provider, please refer to [email or web address] After hours, contact General Neurology

## 2021-12-23 DIAGNOSIS — I639 Cerebral infarction, unspecified: Secondary | ICD-10-CM | POA: Diagnosis not present

## 2021-12-23 LAB — BASIC METABOLIC PANEL
Anion gap: 5 (ref 5–15)
BUN: 16 mg/dL (ref 8–23)
CO2: 26 mmol/L (ref 22–32)
Calcium: 8.5 mg/dL — ABNORMAL LOW (ref 8.9–10.3)
Chloride: 110 mmol/L (ref 98–111)
Creatinine, Ser: 0.4 mg/dL — ABNORMAL LOW (ref 0.44–1.00)
GFR, Estimated: >60 mL/min (ref >60)
Glucose, Bld: 92 mg/dL (ref 70–99)
Potassium: 3.6 mmol/L (ref 3.5–5.1)
Sodium: 141 mmol/L (ref 135–145)

## 2021-12-23 LAB — CBC
HCT: 41.7 % (ref 36.0–46.0)
Hemoglobin: 13 g/dL (ref 12.0–15.0)
MCH: 28.1 pg (ref 26.0–34.0)
MCHC: 31.2 g/dL (ref 30.0–36.0)
MCV: 90.3 fL (ref 80.0–100.0)
Platelets: 236 10*3/uL (ref 150–400)
RBC: 4.62 MIL/uL (ref 3.87–5.11)
RDW: 13.9 % (ref 11.5–15.5)
WBC: 6.1 10*3/uL (ref 4.0–10.5)
nRBC: 0 % (ref 0.0–0.2)

## 2021-12-23 LAB — LAMOTRIGINE LEVEL: Lamotrigine Lvl: <1 ug/mL — ABNORMAL LOW (ref 2.0–20.0)

## 2021-12-23 MED ORDER — AMPHETAMINE-DEXTROAMPHETAMINE 10 MG PO TABS
20.0000 mg | ORAL_TABLET | Freq: Every day | ORAL | Status: DC
  Administered 2021-12-23 – 2021-12-25 (×3): 20 mg via ORAL
  Filled 2021-12-23 (×3): qty 2

## 2021-12-23 MED ORDER — IPRATROPIUM-ALBUTEROL 0.5-2.5 (3) MG/3ML IN SOLN: 3.0000 mL | RESPIRATORY_TRACT | Status: DC | PRN

## 2021-12-23 NOTE — Progress Notes (Signed)
Speech Language Pathology Treatment: Dysphagia;Cognitive-Linquistic  Patient Details Name: Lauren [name] MRN: [id number] DOB: 03-28-1951 Today's Date: 12/23/2021 Time: 5035-4656 SLP Time Calculation (min) (ACUTE ONLY): 15 min  Assessment / Plan / Recommendation Clinical Impression  No longer on COVID precautions per Dr. Avon Gully.  Ms. Veitch demonstrates improved clarity of speech with persisting dysarthria but to lesser degree. There is improved mobility of left lower face and improved awareness of left pocketing/spillage. She demonstrates difficulty with selective attention, requiring cues to put away her phone so that she can attend to feeding task. She initially tolerated trials of thin liquids without incident, then had explosive coughing episode after drinking water. D/W pt and her husband her baseline (likely esophageal) dysphagia with new deficits s/p CVA - recommend pursuing MBS next date if possible; continue nectar thick liquids but advance solids to dysphagia 3. SLP will follow.   HPI HPI: Patient is a 71 y.o. female with PMH: CAD, HTN, DM-2, COPD, GERD, lung nodules, right parotid mass,  tobacco dependency, ADD, stomach ulcers, tremor disorder, prior hemorrhagic stroke, HLD, thrombocytosis. She presented to the hospital on 12/19/21 with left sided weakness, slurred speech and left sided facial droop. Imaging showed moderately sized right MCA CVA including dense involvement of the right basal ganglia, insula and anterior operculum. She was out of window for intervention; some developing midline shift. She failed Yale swallow with RN and SLP ordered for swallow evaluation.      SLP Plan  Continue with current plan of care      Recommendations for follow up therapy are one component of a multi-disciplinary discharge planning process, led by the attending physician.  Recommendations may be updated based on patient status, additional functional criteria and insurance authorization.     Recommendations  Diet recommendations: Dysphagia 3 (mechanical soft);Nectar-thick liquid Liquids provided via: Cup;Straw Medication Administration: Crushed with puree Supervision: Staff to assist with self feeding Compensations: Minimize environmental distractions;Lingual sweep for clearance of pocketing Postural Changes and/or Swallow Maneuvers: Seated upright 90 degrees                Oral Care Recommendations: Oral care BID Follow Up Recommendations: Acute inpatient rehab (3hours/day) Assistance recommended at discharge: Frequent or constant Supervision/Assistance SLP Visit Diagnosis: Cognitive communication deficit (C12.751) Plan: Continue with current plan of care         Mrytle Bento L. Tivis Ringer, MA CCC/SLP Clinical Specialist - Acute Care SLP Acute Rehabilitation Services Office number [phone]   Juan Quam Laurice  12/23/2021, 11:54 AM

## 2021-12-23 NOTE — Progress Notes (Signed)
STROKE TEAM PROGRESS NOTE   INTERVAL HISTORY Her husband is at the bedside.  Patient is sitting up in the bedside chair.  She is doing well and has only mild left hemiparesis.   She has no complaints Therapist recommend inpatient rehab.  Vital signs stable.  Neurological exam unchanged. Vitals:   12/23/21 0805 12/23/21 0900 12/23/21 1244 12/23/21 1649  BP:  126/73 127/80 129/78  Pulse:  72 61 79  Resp:   17 19  Temp:  98.2 F (36.8 C) 98.5 F (36.9 C) 98.9 F (37.2 C)  TempSrc:  Oral Oral Oral  SpO2: 96% 98% 96% 100%  Weight:      Height:       CBC:  Recent Labs  Lab 12/20/21 0755 12/20/21 0757 12/22/21 0313 12/23/21 0411  WBC 6.5   < > 4.9 6.1  NEUTROABS 4.2  --   --   --   HGB 14.6   < > 13.6 13.0  HCT 45.1   < > 43.2 41.7  MCV 89.8   < > 90.4 90.3  PLT 258   < > 276 236   < > = values in this interval not displayed.   Basic Metabolic Panel:  Recent Labs  Lab 12/22/21 0313 12/23/21 0411  NA 145 141  K 4.1 3.6  CL 115* 110  CO2 24 26  GLUCOSE 113* 92  BUN 12 16  CREATININE 0.43* 0.40*  CALCIUM 8.8* 8.5*   Lipid Panel:  Recent Labs  Lab 12/21/21 0324  CHOL 151  TRIG 88  HDL 30*  CHOLHDL 5.0  VLDL 18  LDLCALC 103*   HgbA1c:  Recent Labs  Lab 12/20/21 0755  HGBA1C 5.8*   Urine Drug Screen: No results for input(s): "LABOPIA", "COCAINSCRNUR", "LABBENZ", "AMPHETMU", "THCU", "LABBARB" in the last 168 hours.  Alcohol Level  Recent Labs  Lab 12/20/21 0755  ETH <10    IMAGING past 24 hours No results found.  PHYSICAL EXAM  Physical Exam  Constitutional: Appears frail malnourished looking middle-aged Caucasian lady Cardiovascular: Normal rate and regular rhythm.  Respiratory: Effort normal, non-labored breathing  Neuro: Mental Status: Awake and oriented.  Marland Kitchen Speech is dysarthric but fluent with intact naming for common words. Comprehension intact for basic commands.  Cranial Nerves: II: Visual fields intact when testing each eye  individually, but with extinction on the left to DSS. PERRL.   III,IV, VI: No ptosis. Eyes deviated to the right. Can cross midline to the left with significant difficulty.  V: No sensation to FT or pinch on the left.  VII: Severe left facial droop to lower quadrant.  VIII: Hearing intact to voice IX,X: No hypophonia or hoarseness XI: Weak on the left XII: Tongue deviates to the left Motor: RUE and RLE 5/5 LUE flaccid tone with  3/5 strength proximally and distally. LLE 4/5 proximally and distally Sensory: No sensation to FT or pinch on the left.  Gait: Unable to assess    ASSESSMENT/PLAN [name] is a 71 y.o. female with history of CAD, HTN, DM2, tremor disorder, prior hemorrhagic stroke, HLD, thrombocytosis who presents after waking up falling out of bed with left sided weakness, slurred speech, and left facial droop presenting with left side weakness.  Stroke:  Right MCA infarct Etiology:  in the setting of intracranial large vessel atherosclerotic  disease and uncontrolled risk factors    Code Stroke CT head Positive for right MCA cytotoxic edema and hyperdense Right MCA compatible with ELVO. ASPECTS five. CTA  head & neck with perfusion- Positive for Emergent Large Vessel Occlusion of the Right MCA mid M1 segment, but pseudo-normalization of the Right MCA infarct on both CBF and CBV. Combined with ASPECTS of 5 and moderately reconstituted appearance of the right MCA branches this constellation does not seem favorable for endovascular reperfusion. Significant stenoses: Right ICA origin, 64%. Proximal Left Subclavian Artery, 72%. MRI  Confluent moderate sized right MCA infarct. Trace petechial hemorrhage in the right caudate. No other hemorrhagic transformation. No significant intracranial mass effect. Chronic confluent hemosiderin with encephalomalacia in the posterolateral left superior frontal gyrus is new from the MRI last year. MRI with Contrast- no enhancement to suggest  metastatic disease to the brain. 2D Echo EF 50-55% LDL 103 HgbA1c 5.8 VTE prophylaxis - SCDs    Diet   DIET DYS 3 Room service appropriate? Yes with Assist; Fluid consistency: Nectar Thick   No antithrombotic prior to admission, now on aspirin 81 mg daily.  Therapy recommendations:  Acute inpatient rehab Disposition:  pending  Hypertension Home meds: none Stable Permissive hypertension (OK if < 220/120) but gradually normalize in 5-7 days Long-term BP goal normotensive  Hyperlipidemia Home meds:  Atorvastatin 40mg , resumed in hospital LDL 103, goal < 70 Add Atorvastatin 40mg   High intensity statin not indicated  Continue statin at discharge  Other Stroke Risk Factors Advanced Age >/= 76  Obesity, Body mass index is 18.94 kg/m., BMI >/= 30 associated with increased stroke risk, recommend weight loss, diet and exercise as appropriate  Cigarette smoker, advised to stop smoking Hx stroke/TIA 07/2021 spontaneous, nontraumatic 7.5 cc left frontoparietal superficial intracerebral hemorrhage with unknown etiology, [facility] Residual right lower extremity weakness Coronary artery disease [facility]- Dr. Marijo File Recommend ASA 81mg  Obstructive sleep apnea- does not use CPAP  Other Active Problems Hx of cancer - Lung nodule, Breast cancer, Parotid lesions Currently following with [facility] No signs of metastatic CA on post contrast brain MRI   Essential tremors in upper extremities and head- seen 11/20/2021 by [facility] Neurology  Recommended propanolol 60mg  Bipolar disorder Seroquel, lamictal, trazodone, buspar   Hospital day # 3  Patient presented with dysarthria and left hemiplegia due to large right MCA infarct unfortunately presented outside time window for thrombolysis or thrombectomy due to low aspect score of 5.  She has mild subarachnoid edema but since CT angiogram shows patency of the MCA is unlikely to get worse.  Mobilize out of bed.  Continue ongoing physical  occupational therapy and she will likely need inpatient rehab.  Continue aspirin for now but will likely need dual antiplatelet therapy at discharge and aggressive risk factor modification.  Long discussion with patient and husband at the bedside and answered questions.  Stroke team will sign off.  Kindly call for questions.  Follow-up with outpatient stroke clinic in 2 months      Antony Contras, MD Medical Director [facility] Pager: [phone] 12/23/2021 4:51 PM   To contact Stroke Continuity provider, please refer to [email or web address] After hours, contact General Neurology

## 2021-12-23 NOTE — Progress Notes (Signed)
PROGRESS NOTE    Lauren [name]  LDJ:[id number] DOB: 01-Nov-1950 DOA: 12/20/2021 PCP: Malachy Moan, MD   Brief Narrative:  71 year old woman with hx of CAD, HTN, DM2, tremor disorder, prior hemorrhagic stroke, HLD, thrombocytosis who presents after waking up falling out of bed with left sided weakness, slurred speech, and left facial droop.  Last seen normal last night ~8PM.  Unfortunately imaging shows completed R M1 MCA stroke and she is out of window for intervention.  Does have some developing midline shift so PCCM consulted for admission.   Assessment & Plan:   Principal Problem:   Acute ischemic stroke ([place])   Acute R MCA stroke with evidence of brain compression/edema, recent h/o hemorrhagic stroke 5/23- -unfortunately outside window for any intervention  -Neurology following  -Continue PT OT speech evaluation - MBS planned 9/27 -Some improvement in symptoms per husband at bedside -Disposition pending safe p.o. tolerance and discharge - - Presenting exam: R gaze preference, L hemiplegia, L facial droop   COVID-19 -Initial testing 12/14/2021, has completed quarantine given her asymptomatic outpatient testing only requires 5 days of quarantine - she does not need further contact precautions -No further indication for repeat testing, imaging or intervention   Chronic tremor-  -Continue propranolol   H/o perforated gastric ulcer  Continue home PPI  COPD without acute exacerbation Tobacco dependence, ongoing On Breztri PTA, follws with Dr. Alvin Critchley Continue bronchodilators currently on Brovana and Yupelri while in the hospital   H/o CAD, HTN and HLD  with prior PCI Continue to follow clinically -continue aspirin 81mg    Prior lung ca R side s/p SBRT , prior breast cancer  - s/p lumpectomy 2011   H/o Bipolar 1 and ADHD Pending speech evaluation, resume Seroquel, BuSpar, and Lamictal   DJD As needed Tylenol   H/o OSA CPAP offered  DVT prophylaxis: Holding given  above, early ambulation Code Status: Full Family Communication: Husband at bedside  Status is: Inpatient  Dispo: The patient is from: Home              Anticipated d/c is to: To be determined pending PT evaluation              Anticipated d/c date is: 24 to 48 hours pending clinical course              Patient currently not medically stable for discharge  Consultants:  Neuro, PCCM  Procedures:  None  Antimicrobials:  None  Subjective: No acute issues or events overnight denies nausea vomiting diarrhea constipation headache fevers chills or chest pain  Objective: Vitals:   12/22/21 2048 12/22/21 2051 12/23/21 0047 12/23/21 0430  BP:  120/81 128/75 (!) 143/81  Pulse:  64 71 93  Resp:  16 16 15   Temp:  97.7 F (36.5 C) 98.4 F (36.9 C) 97.8 F (36.6 C)  TempSrc:  Axillary Oral Axillary  SpO2: 98% 100% 97% 96%  Weight:      Height:        Intake/Output Summary (Last 24 hours) at 12/23/2021 0741 Last data filed at 12/22/2021 1500 Gross per 24 hour  Intake 840 ml  Output --  Net 840 ml    Filed Weights   12/20/21 0755 12/20/21 0805  Weight: 44 kg 44 kg    Examination: General:  Pleasantly resting in bed, No acute distress. HEENT: Left-sided facial droop and ptosis. Neck:  Without mass or deformity.  Trachea is midline. Lungs:  Clear to auscultate bilaterally without rhonchi, wheeze,  or rales. Heart:  Regular rate and rhythm.  Without murmurs, rubs, or gallops. Abdomen:  Soft, nontender, nondistended.  Without guarding or rebound. Extremities: 4/5 strength left lower extremity, 3 out of 5 strength left upper extremity, grip strength and shoulder shrug intact Vascular:  Dorsalis pedis and posterior tibial pulses palpable bilaterally. Skin:  Warm and dry, no erythema, no ulcerations.   Data Reviewed: I have personally reviewed following labs and imaging studies  CBC: Recent Labs  Lab 12/20/21 0755 12/20/21 0757 12/21/21 0324 12/22/21 0313 12/23/21 0411   WBC 6.5  --  4.0 4.9 6.1  NEUTROABS 4.2  --   --   --   --   HGB 14.6 15.3* 14.2 13.6 13.0  HCT 45.1 45.0 42.5 43.2 41.7  MCV 89.8  --  87.8 90.4 90.3  PLT 258  --  293 276 149    Basic Metabolic Panel: Recent Labs  Lab 12/20/21 0755 12/20/21 0757 12/20/21 1450 12/21/21 0324 12/21/21 1032 12/21/21 1626 12/21/21 2135 12/22/21 0313 12/23/21 0411  NA 141 140   < > 145 145 146* 146* 145 141  K 4.0 3.9  --  3.4*  --   --   --  4.1 3.6  CL 107 106  --  111  --   --   --  115* 110  CO2 25  --   --  23  --   --   --  24 26  GLUCOSE 91 95  --  103*  --   --   --  113* 92  BUN 6* 8  --  6*  --   --   --  12 16  CREATININE 0.54 0.50  --  0.39*  --   --   --  0.43* 0.40*  CALCIUM 8.7*  --   --  8.4*  --   --   --  8.8* 8.5*   < > = values in this interval not displayed.    GFR: Estimated Creatinine Clearance: 46.1 mL/min (A) (by C-G formula based on SCr of 0.4 mg/dL (L)). Liver Function Tests: Recent Labs  Lab 12/20/21 0755  AST 26  ALT 23  ALKPHOS 84  BILITOT 0.8  PROT 6.2*  ALBUMIN 3.1*    No results for input(s): "LIPASE", "AMYLASE" in the last 168 hours. No results for input(s): "AMMONIA" in the last 168 hours. Coagulation Profile: Recent Labs  Lab 12/20/21 0755  INR 0.9    Cardiac Enzymes: No results for input(s): "CKTOTAL", "CKMB", "CKMBINDEX", "TROPONINI" in the last 168 hours. BNP (last 3 results) No results for input(s): "PROBNP" in the last 8760 hours. HbA1C: Recent Labs    12/20/21 0755  HGBA1C 5.8*    CBG: Recent Labs  Lab 12/20/21 0753 12/21/21 0905  GLUCAP 106* 97    Lipid Profile: Recent Labs    12/21/21 0324  CHOL 151  HDL 30*  LDLCALC 103*  TRIG 88  CHOLHDL 5.0    Thyroid Function Tests: No results for input(s): "TSH", "T4TOTAL", "FREET4", "T3FREE", "THYROIDAB" in the last 72 hours. Anemia Panel: No results for input(s): "VITAMINB12", "FOLATE", "FERRITIN", "TIBC", "IRON", "RETICCTPCT" in the last 72 hours. Sepsis Labs: No  results for input(s): "PROCALCITON", "LATICACIDVEN" in the last 168 hours.  Recent Results (from the past 240 hour(s))  MRSA Next Gen by PCR, Nasal     Status: None   Collection Time: 12/20/21 11:10 PM   Specimen: Nasal Mucosa; Nasal Swab  Result Value Ref Range Status  MRSA by PCR Next Gen NOT DETECTED NOT DETECTED Final    Comment: (NOTE) The GeneXpert MRSA Assay (FDA approved for NASAL specimens only), is one component of a comprehensive MRSA colonization surveillance program. It is not intended to diagnose MRSA infection nor to guide or monitor treatment for MRSA infections. Test performance is not FDA approved in patients less than 95 years old. Performed at [facility], [place] [address] [place], [place] [address]          Radiology Studies: CT HEAD WO CONTRAST (5MM)  Result Date: 12/22/2021 CLINICAL DATA:  71 year old female code stroke presentation with right MCA infarct visible on presentation CT, distal M1 LV0 with reconstitution. EXAM: CT HEAD WITHOUT CONTRAST TECHNIQUE: Contiguous axial images were obtained from the base of the skull through the vertex without intravenous contrast. RADIATION DOSE REDUCTION: This exam was performed according to the departmental dose-optimization program which includes automated exposure control, adjustment of the mA and/or kV according to patient size and/or use of iterative reconstruction technique. COMPARISON:  Brain MRI both without and with contrast yesterday. Head CT 12/20/2021 and earlier. FINDINGS: Brain: Mild evolution of cytotoxic edema on CT since 12/20/2021. No hemorrhagic transformation. Compared to the presentation CT, mild mass effect has developed in the right hemisphere including trace leftward midline shift and partially effaced right lateral ventricle. But the extent of cytotoxic edema has not significantly changed. Left hemisphere and posterior fossa gray-white differentiation maintained. Basilar cisterns remain normal.  Vascular: Calcified atherosclerosis at the skull base. Skull: No acute osseous abnormality identified. Sinuses/Orbits: Visualized paranasal sinuses and mastoids are stable and well aerated. Other: Mild rightward gaze.  Stable scalp soft tissues. IMPRESSION: 1. Right MCA infarct with stable extent but evolved edema and mild mass effect since presentation. Trace leftward midline shift. No malignant hemorrhagic transformation. 2. No new intracranial abnormality. Electronically Signed   By: Genevie Ann M.D.   On: 12/22/2021 05:30   DG Chest Port 1 View  Result Date: 12/21/2021 CLINICAL DATA:  Stroke. EXAM: PORTABLE CHEST 1 VIEW COMPARISON:  CT scan of the chest Jul 30, 2021. Chest x-ray Aug 07, 2021. FINDINGS: A left upper lobe nodule similar in the interval. The spiculated nodule centrally in the right lung is not well assessed with chest x-ray imaging. Hyperinflation of the lungs. Blunting of the costophrenic angles may represent tiny effusions. No overt edema. Possible nipple shadow over the right base. The cardiomediastinal silhouette is stable. No pneumothorax. IMPRESSION: 1. Stable left upper lobe nodule. 2. The central spiculated nodule in the right mid lung on previous CT imaging is not well assessed on this study. 3. Possible tiny pleural effusions, particularly on the right. 4. Confluence of shadows versus nipple shadow over the right base. Recommend attention to this region on follow-up or repeat imaging with nipple markers. 5. Hyperinflation of the lungs. Electronically Signed   By: Dorise Bullion III M.D.   On: 12/21/2021 16:47   MR BRAIN W CONTRAST  Result Date: 12/21/2021 CLINICAL DATA:  Acute right MCA infarct. Order states brain metastases suspected. Personal history of lung and breast cancer. EXAM: MRI HEAD WITH CONTRAST TECHNIQUE: Multiplanar, multiecho pulse sequences of the brain and surrounding structures were obtained with intravenous contrast. CONTRAST:  4.4 mL Vueway COMPARISON:  MR head  without contrast 12/21/2021 FINDINGS: Brain: The study is mildly degraded by patient motion. No pathologic enhancement is present. Cortical thickening and edema is present in the anterior right frontal lobe and operculum consistent with the known infarct. T2  signal changes are present within the right caudate head and lentiform nucleus. IMPRESSION: 1. No pathologic enhancement to suggest metastatic disease to the brain. 2. Cortical thickening and edema in the anterior right frontal lobe and operculum consistent with the known infarct. Electronically Signed   By: San Morelle M.D.   On: 12/21/2021 15:46    Scheduled Meds:  amphetamine-dextroamphetamine  20 mg Oral q morning   arformoterol  15 mcg Nebulization BID   aspirin EC  81 mg Oral Daily   atorvastatin  40 mg Oral QHS   busPIRone  30 mg Oral TID   Chlorhexidine Gluconate Cloth  6 each Topical Daily   hydrOXYzine  25 mg Oral Daily   lamoTRIgine  200 mg Oral Daily   pantoprazole  40 mg Oral Daily   propranolol ER  60 mg Oral Daily   QUEtiapine  200 mg Oral QHS   revefenacin  175 mcg Nebulization Daily   sodium chloride flush  3 mL Intravenous Once   Continuous Infusions:   LOS: 3 days   Time spent: 74min  Maico Mulvehill C Jarris Kortz, DO [facility]  If 7PM-7AM, please contact night-coverage [email or web address]  12/23/2021, 7:41 AM

## 2021-12-23 NOTE — Progress Notes (Signed)
Inpatient Rehabilitation Admissions Coordinator   Inpatient rehab consult received. Noted COVID + 12/14/21. Patients are eligible to be considered for admit to the [facility] when cleared from airborne precautions by acute MD or Infectious disease regardless of onset day.  Please call me with any questions. I will follow up tomorrow.   Danne Baxter, RN, MSN Rehab Admissions Coordinator [phone] 12/23/2021 10:56 AM

## 2021-12-23 NOTE — Plan of Care (Signed)
  Problem: Education: Goal: Ability to describe self-care measures that may prevent or decrease complications (Diabetes Survival Skills Education) will improve Outcome: Progressing Goal: Individualized Educational Video(s) Outcome: Progressing   Problem: Coping: Goal: Ability to adjust to condition or change in health will improve Outcome: Progressing   Problem: Fluid Volume: Goal: Ability to maintain a balanced intake and output will improve Outcome: Progressing   Problem: Health Behavior/Discharge Planning: Goal: Ability to identify and utilize available resources and services will improve Outcome: Progressing Goal: Ability to manage health-related needs will improve Outcome: Progressing   Problem: Metabolic: Goal: Ability to maintain appropriate glucose levels will improve Outcome: Progressing   Problem: Nutritional: Goal: Maintenance of adequate nutrition will improve Outcome: Progressing Goal: Progress toward achieving an optimal weight will improve Outcome: Progressing   Problem: Skin Integrity: Goal: Risk for impaired skin integrity will decrease Outcome: Progressing   Problem: Tissue Perfusion: Goal: Adequacy of tissue perfusion will improve Outcome: Progressing   Problem: Education: Goal: Knowledge of disease or condition will improve Outcome: Progressing Goal: Knowledge of secondary prevention will improve (SELECT ALL) Outcome: Progressing Goal: Knowledge of patient specific risk factors will improve (INDIVIDUALIZE FOR PATIENT) Outcome: Progressing Goal: Individualized Educational Video(s) Outcome: Progressing   Problem: Coping: Goal: Will verbalize positive feelings about self Outcome: Progressing Goal: Will identify appropriate support needs Outcome: Progressing   Problem: Health Behavior/Discharge Planning: Goal: Ability to manage health-related needs will improve Outcome: Progressing   Problem: Self-Care: Goal: Ability to participate in  self-care as condition permits will improve Outcome: Progressing Goal: Verbalization of feelings and concerns over difficulty with self-care will improve Outcome: Progressing Goal: Ability to communicate needs accurately will improve Outcome: Progressing   Problem: Nutrition: Goal: Risk of aspiration will decrease Outcome: Progressing Goal: Dietary intake will improve Outcome: Progressing   Problem: Ischemic Stroke/TIA Tissue Perfusion: Goal: Complications of ischemic stroke/TIA will be minimized Outcome: Progressing   Problem: Education: Goal: Knowledge of General Education information will improve Description: Including pain rating scale, medication(s)/side effects and non-pharmacologic comfort measures Outcome: Progressing   Problem: Health Behavior/Discharge Planning: Goal: Ability to manage health-related needs will improve Outcome: Progressing   Problem: Clinical Measurements: Goal: Ability to maintain clinical measurements within normal limits will improve Outcome: Progressing Goal: Will remain free from infection Outcome: Progressing Goal: Diagnostic test results will improve Outcome: Progressing Goal: Respiratory complications will improve Outcome: Progressing Goal: Cardiovascular complication will be avoided Outcome: Progressing   Problem: Activity: Goal: Risk for activity intolerance will decrease Outcome: Progressing   Problem: Nutrition: Goal: Adequate nutrition will be maintained Outcome: Progressing   Problem: Coping: Goal: Level of anxiety will decrease Outcome: Progressing   Problem: Elimination: Goal: Will not experience complications related to bowel motility Outcome: Progressing Goal: Will not experience complications related to urinary retention Outcome: Progressing   Problem: Pain Managment: Goal: General experience of comfort will improve Outcome: Progressing   Problem: Safety: Goal: Ability to remain free from injury will  improve Outcome: Progressing   Problem: Skin Integrity: Goal: Risk for impaired skin integrity will decrease Outcome: Progressing

## 2021-12-23 NOTE — Progress Notes (Signed)
  Transition of Care [facility]) Screening Note   Patient Details  Name: Lauren [name] Date of Birth: [birth year]   Transition of Care [facility]) CM/SW Contact:    Pollie Friar, RN Phone Number: 12/23/2021, 10:56 AM   Pt admitted with a stroke. She is from home with spouse. Pt covid + 9 days ago. CIR is the current recommendation. Transition of Care Department [facility]) has reviewed patient. We will continue to monitor patient advancement through interdisciplinary progression rounds. If new patient transition needs arise, please place a TOC consult.

## 2021-12-24 ENCOUNTER — Inpatient Hospital Stay (HOSPITAL_COMMUNITY): Payer: Medicare Other

## 2021-12-24 DIAGNOSIS — I639 Cerebral infarction, unspecified: Secondary | ICD-10-CM | POA: Diagnosis not present

## 2021-12-24 LAB — CBC
HCT: 40.8 % (ref 36.0–46.0)
Hemoglobin: 13.2 g/dL (ref 12.0–15.0)
MCH: 28.5 pg (ref 26.0–34.0)
MCHC: 32.4 g/dL (ref 30.0–36.0)
MCV: 88.1 fL (ref 80.0–100.0)
Platelets: 338 10*3/uL (ref 150–400)
RBC: 4.63 MIL/uL (ref 3.87–5.11)
RDW: 13.6 % (ref 11.5–15.5)
WBC: 9 10*3/uL (ref 4.0–10.5)
nRBC: 0 % (ref 0.0–0.2)

## 2021-12-24 LAB — BASIC METABOLIC PANEL
Anion gap: 7 (ref 5–15)
BUN: 14 mg/dL (ref 8–23)
CO2: 27 mmol/L (ref 22–32)
Calcium: 8.7 mg/dL — ABNORMAL LOW (ref 8.9–10.3)
Chloride: 105 mmol/L (ref 98–111)
Creatinine, Ser: 0.44 mg/dL (ref 0.44–1.00)
GFR, Estimated: >60 mL/min (ref >60)
Glucose, Bld: 95 mg/dL (ref 70–99)
Potassium: 3.7 mmol/L (ref 3.5–5.1)
Sodium: 139 mmol/L (ref 135–145)

## 2021-12-24 MED ORDER — ENOXAPARIN SODIUM 30 MG/0.3ML IJ SOSY
30.0000 mg | PREFILLED_SYRINGE | INTRAMUSCULAR | Status: DC
  Administered 2021-12-24: 30 mg via SUBCUTANEOUS
  Filled 2021-12-24: qty 0.3

## 2021-12-24 NOTE — Evaluation (Signed)
Occupational Therapy Evaluation Patient Details Name: Lauren [name] MRN: [id number] DOB: [birth year] Today's Date: 12/24/2021   History of Present Illness The pt is a 71 yo female presenting 9/22 with L-sided weakness, L facial droop, and R sided gaze. Imaging showed R MCA LVO, but outside window for intervention. 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   Clinical Impression   Patient upright in bed and agreeable to OT session, eager to get OOB today.  Patient completing transfers and mobility in room using RW with min assist- requires max cueing for sustained task attention and L hand placement on RW.  She requires mod assist for toileting today, min assist for grooming at sink and min assist for UB dressing.  Patient on RA during session, remains limited by decreased attention, problem solving, recall and safety, as well as balance, L hemiparesis.  Highly recommend AIR at dc to optimize safety and independence with ADLs.      Recommendations for follow up therapy are one component of a multi-disciplinary discharge planning process, led by the attending physician.  Recommendations may be updated based on patient status, additional functional criteria and insurance authorization.   Follow Up Recommendations  Acute inpatient rehab (3hours/day)    Assistance Recommended at Discharge Frequent or constant Supervision/Assistance  Patient can return home with the following A little help with walking and/or transfers;A little help with bathing/dressing/bathroom;Assistance with cooking/housework;Direct supervision/assist for medications management;Direct supervision/assist for financial management;Assist for transportation;Help with stairs or ramp for entrance    Functional Status Assessment     Equipment  Recommendations  Other (comment) (defer)    Recommendations for Other Services Rehab consult     Precautions / Restrictions Precautions Precautions: Fall Precaution Comments: watch O2 Restrictions Weight Bearing Restrictions: No      Mobility Bed Mobility Overal bed mobility: Needs Assistance Bed Mobility: Supine to Sit     Supine to sit: Min assist     General bed mobility comments: min assist to initate, sustain attention and complete task.    Transfers Overall transfer level: Needs assistance Equipment used: Rolling walker (2 wheels) Transfers: Sit to/from Stand Sit to Stand: Min assist           General transfer comment: to power up and steady, cueing for L hand placement and attention.      Balance Overall balance assessment: Needs assistance Sitting-balance support: No upper extremity supported, Feet supported Sitting balance-Leahy Scale: Fair     Standing balance support: Bilateral upper extremity supported, During functional activity, No upper extremity supported Standing balance-Leahy Scale: Poor Standing balance comment: relies on BUE and external support                           ADL either performed or assessed with clinical judgement   ADL Overall ADL's : Needs assistance/impaired     Grooming: Minimal assistance;Wash/dry hands;Standing           Upper Body Dressing : Minimal assistance;Sitting Upper Body Dressing Details (indicate cue type and reason): donning new gown, attending to L UE Lower Body Dressing: Moderate assistance;Sit to/from stand Lower Body Dressing Details (indicate cue type and reason): assist for L sock, min assist in standing Toilet Transfer: Minimal assistance;Ambulation;Rolling walker (2 wheels);Grab bars   Toileting- Clothing Manipulation and Hygiene: Moderate assistance;Sit to/from stand Toileting -  Clothing Manipulation Details (indicate cue type and reason): assist to manage briefs off (soiled), min  assist to complete hygiene     Functional mobility during ADLs: Minimal assistance;Rolling walker (2 wheels);Cueing for sequencing;Cueing for safety General ADL Comments: remains limited by L sided weakness, impaired cognition     Vision         Perception     Praxis      Pertinent Vitals/Pain Pain Assessment Pain Assessment: No/denies pain     Hand Dominance     Extremity/Trunk Assessment             Communication     Cognition Arousal/Alertness: Awake/alert Behavior During Therapy: Flat affect, Anxious Overall Cognitive Status: Impaired/Different from baseline Area of Impairment: Attention, Following commands, Safety/judgement, Awareness, Problem solving, Memory                   Current Attention Level: Focused Memory: Decreased short-term memory Following Commands: Follows one step commands consistently, Follows one step commands with increased time Safety/Judgement: Decreased awareness of safety, Decreased awareness of deficits Awareness: Emergent Problem Solving: Slow processing, Decreased initiation, Difficulty sequencing, Requires verbal cues, Requires tactile cues General Comments: pt requires constant redirection to task, aware of need to have BM during session. Difficulty sustaining attention to 1 task in quiet enviorment. poor recall throughout session     General Comments  pt on RA, feels chest tightness in bathroom due to "still air". Respiratory arriving at end of session and provided breathing treatment.    Exercises Exercises: Other exercises Other Exercises Other Exercises: L UE exercises x 8 reps: shoulder elevation, forward press.   Shoulder Instructions      Home Living                                          Prior Functioning/Environment                          OT Problem List:        OT Treatment/Interventions:      OT Goals(Current goals can be found in the care plan section) Acute Rehab OT  Goals Patient Stated Goal: back to independent OT Goal Formulation: With patient Time For Goal Achievement: 01/05/22 Potential to Achieve Goals: Good  OT Frequency: Min 2X/week    Co-evaluation              AM-PAC OT "6 Clicks" Daily Activity     Outcome Measure Help from another person eating meals?: A Little Help from another person taking care of personal grooming?: A Little Help from another person toileting, which includes using toliet, bedpan, or urinal?: A Lot Help from another person bathing (including washing, rinsing, drying)?: A Lot Help from another person to put on and taking off regular upper body clothing?: A Little Help from another person to put on and taking off regular lower body clothing?: A Lot 6 Click Score: 15   End of Session Equipment Utilized During Treatment: Gait belt;Rolling walker (2 wheels) Nurse Communication: Mobility status;Other (comment) (+BM)  Activity Tolerance: Patient tolerated treatment well Patient left: in chair;with call bell/phone within reach;with chair alarm set;with family/visitor present  OT Visit Diagnosis: Unsteadiness on feet (R26.81);Other abnormalities of gait and mobility (R26.89);Muscle weakness (generalized) (M62.81);Hemiplegia and hemiparesis Hemiplegia - Right/Left: Left Hemiplegia - dominant/non-dominant: Dominant Hemiplegia - caused by: Cerebral infarction  Time: 9242-6834 OT Time Calculation (min): 37 min Charges:  OT General Charges $OT Visit: 1 Visit OT Treatments $Self Care/Home Management : 23-37 mins  [place] Office [phone]   Delight Stare 12/24/2021, 10:21 AM

## 2021-12-24 NOTE — Progress Notes (Signed)
Mobility Specialist: Progress Note   12/24/21 1444  Mobility  Activity Ambulated with assistance in room  Level of Assistance Moderate assist, patient does 50-74%  Assistive Device Front wheel walker  Distance Ambulated (ft) 50 ft (25'x2)  Activity Response Tolerated fair  $Mobility charge 1 Mobility   Pre-Mobility: 74 HR, 92% SpO2 Post-Mobility: 76 HR, 93% SpO2  Pt received in the bed and agreeable to mobility. Independent with bed mobility. ModA to stand and during ambulation for balance. Pt impulsive at beginning of session requiring verbal cues for hand placement and RW management. Required x1 seated break secondary to fatigue. Impulsiveness improved throughout session as pt began to ask "can I stand up now?" Pt drifting to the L during session, modA for balance. Pt back to bed after session with call bell and phone at her side.   [facility] Jamielyn Petrucci Mobility Specialist Mobility Specialist [address]: [phone]

## 2021-12-24 NOTE — Progress Notes (Signed)
Modified Barium Swallow Progress Note  Patient Details  Name: Lauren [name] MRN: [id number] Date of Birth: June 15, 1950  Today's Date: 12/24/2021  Modified Barium Swallow completed.  Full report located under Chart Review in the Imaging Section.  Brief recommendations include the following:  Clinical Impression  Patient presents with a mild oropharyngeal and a mild-moderate esophageal dysphagia as per this modified barium swallow study. During oral phase, she exhibited decreased bolus cohesion with liquids and delays in mastication and anterior to posterior transit of puree and soft solid textures. During pharyngeal phase, swallow was initiated at level of vallecular sinus with puree and soft solids as well as with teaspoon sip of thin liquid. Swallow was initiated at level of pyriform sinus with thin and nectar thick liquids. Flash, trace penetration occured with thin liquids via cup sips and with nectar thick liquids. When taking larger sip of barium to try to aid in clearing soft solid barium stasis in esophagus (upper thoracic portion), patient did exhibit trace, sensed aspiration of nectar thick liquid barium during the swallow. SLP did observe that cervical esophagus appeared dilated and suspected osteophytes present. During sweep of esophagus, stasis of soft solid barium in upper thoracic portion of esophagus observed and sip of nectar thick liquids did aid in transiting fully; also observed possible dysmotility and retrograde movement of barium in distal esophagus. SLP is recommending to continue with Dys 3 (mechanical soft) solids and nectar thick liquids but to allow patient to have thin liquids in between meals.   Swallow Evaluation Recommendations       SLP Diet Recommendations: Dysphagia 3 (Mech soft) solids;Nectar thick liquid   Liquid Administration via: Cup;Straw   Medication Administration: Whole meds with liquid   Supervision: Staff to assist with self feeding;Full  supervision/cueing for compensatory strategies   Compensations: Minimize environmental distractions;Lingual sweep for clearance of pocketing   Postural Changes: Seated upright at 90 degrees;Remain semi-upright after after feeds/meals (Comment) (30-45 minutes)   Oral Care Recommendations: Oral care BID       Sonia Baller, MA, CCC-SLP Speech Therapy

## 2021-12-24 NOTE — PMR Pre-admission (Signed)
PMR Admission Coordinator Pre-Admission Assessment  Patient: Lauren [name] is an 71 y.o., female MRN: [id number] DOB: 1952/05/12Height: 5' (152.4 cm) Weight: 44 kg  Insurance Information HMO:     PPO:      PCP:      IPA:      80/20:      OTHER:  PRIMARY: Medicare a and b      Policy#: [id number]      Subscriber: pt Benefits:  Phone #: passport one source     Name: 9/27 Eff. Date: 02/27/2017     Deduct: $1600      Out of Pocket Max: none      Life Max: none CIR: 100%      SNF: 20 full days Outpatient: 80%     Co-Pay: 20% Home Health: 100%      Co-Pay: none DME: 80%     Co-Pay: 20% Providers: pt choice  SECONDARY: Roseanna Rainbow      Policy#: [id number]  THIRD: Tricare for LIFE   policy # [id number]  Financial Counselor:       Phone#:   The "Data Collection Information Summary" for patients in Inpatient Rehabilitation Facilities with attached "Privacy Act [place] Records" was provided and verbally reviewed with: Patient and Family  Emergency Contact Information Contact Information     Name Relation Home Work Mobile   [place] Spouse   [phone]   Harrington,Nikole Daughter   [phone]      Current Medical History  Patient Admitting Diagnosis: CVA  History of Present Illness: 71 year old female with history of CAD, HTN, DM2, tremor disorder, prior hemorrhagic CVA, HLD, thrombocytosis and lung cancer, breast cancer, parotid lesions and Bipolar  who presented on 12/20/21 after waking up and falling out of bed with left sided weakness, slurred speech and left facial droop. Imaging showed R M1 MCA CVA.  Neurology consulted. CTA head and neck with perfusion, positive for large vessel occlusion of the right MCA mid M 1 Segment, but pseudo normalization of the right MCA infarct and moderately reconstituted appearance of the right MCA branches .This constellation did not seem favorable for endovascular reperfusion. Significant stenosis; right ICA origin, 64%.  Proximal left subclavian artery, 72 %. 2 d echo ef 50 to 55%, LDL 103, Hb A1c 5.8.No antithrombotic pta, now on ASA. To allow permissive HTN. Resumed Atorvastatin. Obstructive sleep apnea does not uses CPAP. No signs of metastatic CA on MRI. Essential tremors in upper extremities and head, seen 11/20/21 as op Neurology. Recommend propanolol.  Continue home meds for Bipolar.   Complete NIHSS TOTAL: 5  Patient's medical record from [facility] has been reviewed by the rehabilitation admission coordinator and physician.  Past Medical History  Past Medical History:  Diagnosis Date   Diabetes mellitus without complication ([place])    Hypertension    Has the patient had major surgery during 100 days prior to admission? No  Family History   family history is not on file.  Current Medications  Current Facility-Administered Medications:    acetaminophen (TYLENOL) tablet 650 mg, 650 mg, Oral, Q4H PRN, 650 mg at 12/25/21 0501 **OR** acetaminophen (TYLENOL) 160 MG/5ML solution 650 mg, 650 mg, Per Tube, Q4H PRN **OR** acetaminophen (TYLENOL) suppository 650 mg, 650 mg, Rectal, Q4H PRN, Candee Furbish, MD, 650 mg at 12/20/21 1854   amphetamine-dextroamphetamine (ADDERALL) tablet 20 mg, 20 mg, Oral, Q breakfast, Little Ishikawa, MD, 20 mg at 12/25/21 0830   arformoterol (BROVANA) nebulizer solution 15 mcg, 15 mcg,  Nebulization, BID, Candee Furbish, MD, 15 mcg at 12/25/21 2330   aspirin EC tablet 81 mg, 81 mg, Oral, Daily, Shafer, [place], NP, 81 mg at 12/25/21 0830   atorvastatin (LIPITOR) tablet 40 mg, 40 mg, Oral, QHS, Erick Colace, NP, 40 mg at 12/24/21 2040   busPIRone (BUSPAR) tablet 30 mg, 30 mg, Oral, TID, Erick Colace, NP, 30 mg at 12/25/21 0830   Chlorhexidine Gluconate Cloth 2 % PADS 6 each, 6 each, Topical, Daily, Candee Furbish, MD, 6 each at 12/25/21 0831   enoxaparin (LOVENOX) injection 30 mg, 30 mg, Subcutaneous, Q24H, Pahwani, Ravi, MD, 30 mg at 12/24/21 2041   food  thickener (SIMPLYTHICK (NECTAR/LEVEL 2/MILDLY THICK)) 1 packet, 1 packet, Oral, PRN, Erick Colace, NP   hydrOXYzine (ATARAX) tablet 25 mg, 25 mg, Oral, Daily, Little Ishikawa, MD, 25 mg at 12/25/21 0830   ipratropium-albuterol (DUONEB) 0.5-2.5 (3) MG/3ML nebulizer solution 3 mL, 3 mL, Nebulization, Q2H PRN, Little Ishikawa, MD   lamoTRIgine (LAMICTAL) tablet 200 mg, 200 mg, Oral, Daily, Salvadore Dom E, NP, 200 mg at 12/25/21 0830   Oral care mouth rinse, 15 mL, Mouth Rinse, PRN, Candee Furbish, MD   pantoprazole (PROTONIX) EC tablet 40 mg, 40 mg, Oral, Daily, Bell, Lorin C, RPH, 40 mg at 12/25/21 0830   propranolol ER (INDERAL LA) 24 hr capsule 60 mg, 60 mg, Oral, Daily, Little Ishikawa, MD, 60 mg at 12/25/21 0830   QUEtiapine (SEROQUEL) tablet 200 mg, 200 mg, Oral, QHS, Erick Colace, NP, 200 mg at 12/24/21 2041   revefenacin (YUPELRI) nebulizer solution 175 mcg, 175 mcg, Nebulization, Daily, Candee Furbish, MD, 175 mcg at 12/25/21 0854   senna-docusate (Senokot-S) tablet 1 tablet, 1 tablet, Oral, QHS PRN, Candee Furbish, MD   sodium chloride flush (NS) 0.9 % injection 3 mL, 3 mL, Intravenous, Once, Scheving, Livingston Diones, MD  Patients Current Diet:  Diet Order             DIET DYS 3 Room service appropriate? Yes with Assist; Fluid consistency: Nectar Thick  Diet effective now                  Precautions / Restrictions Precautions Precautions: Fall Precaution Comments: watch O2 Restrictions Weight Bearing Restrictions: No   Has the patient had 2 or more falls or a fall with injury in the past year? No  Prior Activity Level Community (5-7x/wk): Mod I with cane  Prior Functional Level Self Care: Did the patient need help bathing, dressing, using the toilet or eating? Independent  Indoor Mobility: Did the patient need assistance with walking from room to room (with or without device)? Independent  Stairs: Did the patient need assistance with internal or  external stairs (with or without device)? Independent  Functional Cognition: Did the patient need help planning regular tasks such as shopping or remembering to take medications? Independent  Patient Information Are you of Hispanic, Latino/a,or Spanish origin?: A. No, not of Hispanic, Latino/a, or Spanish origin What is your race?: A. White Do you need or want an interpreter to communicate with a doctor or health care staff?: 0. No  Patient's Response To:  Health Literacy and Transportation Is the patient able to respond to health literacy and transportation needs?: Yes Health Literacy - How often do you need to have someone help you when you read instructions, pamphlets, or other written material from your doctor or pharmacy?: Never In the past 12 months, has  lack of transportation kept you from medical appointments or from getting medications?: No In the past 12 months, has lack of transportation kept you from meetings, work, or from getting things needed for daily living?: No  Home Assistive Devices / Equipment Home Equipment: Conservation officer, nature (2 wheels), [employer] - quad, Grab bars - toilet, Tub bench  Prior Device Use: Indicate devices/aids used by the patient prior to current illness, exacerbation or injury?  cane  Current Functional Level Cognition  Arousal/Alertness: Awake/alert Overall Cognitive Status: Impaired/Different from baseline Current Attention Level: Focused Orientation Level: Oriented X4 Following Commands: Follows one step commands consistently, Follows one step commands with increased time Safety/Judgement: Decreased awareness of safety, Decreased awareness of deficits General Comments: pt requires constant redirection to task, aware of need to have BM during session. Difficulty sustaining attention to 1 task in quiet enviorment. poor recall throughout session Attention: Selective Selective Attention: Impaired Selective Attention Impairment: Verbal basic Memory:  Impaired Memory Impairment: Retrieval deficit Awareness: Impaired Awareness Impairment: Intellectual impairment Safety/Judgment: Impaired    Extremity Assessment (includes Sensation/Coordination)  Upper Extremity Assessment: Defer to OT evaluation RUE Deficits / Details: generally weak but ROM is WFL. Per pt and family, pt had full return of R hemibody since CVA in 05/23 RUE Sensation: WNL RUE Coordination: WNL LUE Deficits / Details: Overall 3/5 MMT. FUll PROM but able to move actively through about 50% of range. Able to hold shoulder flexion against gravity for ~5 seconds without drift. Needed cues to use functionally to don sock and use on RW LUE Sensation: decreased light touch, decreased proprioception LUE Coordination: decreased fine motor, decreased gross motor  Lower Extremity Assessment: RLE deficits/detail, LLE deficits/detail RLE Deficits / Details: grossly WFL to MMT in bed, able to use functionally for OOB mobility. reports sensation intact after last stroke RLE Sensation: WNL RLE Coordination: WNL LLE Deficits / Details: grossly functional to MMT in bed, good ability to move against gravity with direct cues, but poor functional use. sensation impaired compared to RLE LLE Sensation: decreased light touch LLE Coordination: decreased fine motor, decreased gross motor    ADLs  Overall ADL's : Needs assistance/impaired Eating/Feeding: Minimal assistance, Sitting Grooming: Minimal assistance, Wash/dry hands, Standing Upper Body Bathing: Minimal assistance, Sitting Lower Body Bathing: Moderate assistance, Sit to/from stand Upper Body Dressing : Minimal assistance, Sitting Upper Body Dressing Details (indicate cue type and reason): donning new gown, attending to L UE Lower Body Dressing: Moderate assistance, Sit to/from stand Lower Body Dressing Details (indicate cue type and reason): assist for L sock, min assist in standing Toilet Transfer: Minimal assistance, Ambulation,  Rolling walker (2 wheels), Grab bars Toileting- Clothing Manipulation and Hygiene: Moderate assistance, Sit to/from stand Toileting - Clothing Manipulation Details (indicate cue type and reason): assist to manage briefs off (soiled), min assist to complete hygiene Functional mobility during ADLs: Minimal assistance, Rolling walker (2 wheels), Cueing for sequencing, Cueing for safety General ADL Comments: remains limited by L sided weakness, impaired cognition    Mobility  Overal bed mobility: Needs Assistance Bed Mobility: Supine to Sit Supine to sit: Min assist General bed mobility comments: min assist to initate, sustain attention and complete task.    Transfers  Overall transfer level: Needs assistance Equipment used: Rolling walker (2 wheels) Transfers: Sit to/from Stand Sit to Stand: Min assist General transfer comment: to power up and steady, cueing for L hand placement and attention.    Ambulation / Gait / Stairs / Wheelchair Mobility  Ambulation/Gait Ambulation/Gait assistance: Min assist, +2  safety/equipment Gait Distance (Feet): 5 Feet (+ 105f) Assistive device: 2 person hand held assist, Rolling walker (2 wheels) Gait Pattern/deviations: Step-through pattern, Knee hyperextension - left, Decreased step length - left, Decreased stride length General Gait Details: pt with poor functional use and movement of LLE with gait. increased cues to advance appropriately and cues for increased clearance. improved stability with use of RW, VSS on 2L with gait Gait velocity: decreased Gait velocity interpretation: <1.31 ft/sec, indicative of household ambulator    Posture / Balance Balance Overall balance assessment: Needs assistance Sitting-balance support: No upper extremity supported, Feet supported Sitting balance-Leahy Scale: Fair Standing balance support: Bilateral upper extremity supported, During functional activity, No upper extremity supported Standing balance-Leahy Scale:  Poor Standing balance comment: relies on BUE and external support    Special needs/care consideration COVID + 9/17. Completed isolation 9/27 Hgb A1c 5.8 Smoker   Previous Home Environment  Living Arrangements: Spouse/significant other  Lives With: Spouse Available Help at Discharge: Available 24 hours/day Type of Home: House Home Layout: Two level Alternate Level Stairs-Rails: Right Alternate Level Stairs-Number of Steps: flight. Full bath in Basement. Bath on main level is being renovated Home Access: Stairs to enter Entrance Stairs-Rails: None ETechnical brewerof Steps: 1+1 carport Bathroom Shower/Tub: WMultimedia programmer Handicapped height Bathroom Accessibility: Yes How Accessible: Accessible via walker HFranklin No  Discharge Living Setting Plans for Discharge Living Setting: Patient's home, Lives with (comment) (spouse) Type of Home at Discharge: House Discharge Home Layout: Two level, Able to live on main level with bedroom/bathroom Alternate Level Stairs-Rails: Right Alternate Level Stairs-Number of Steps: flight Discharge Home Access: Stairs to enter Entrance Stairs-Rails: None Entrance Stairs-Number of Steps: 1 + 1 carport Discharge Bathroom Shower/Tub: Walk-in shower Discharge Bathroom Toilet: Handicapped height Discharge Bathroom Accessibility: Yes How Accessible: Accessible via walker Does the patient have any problems obtaining your medications?: No  Social/Family/Support Systems Patient Roles: Spouse Contact Information: spouse , DShanon BrowAnticipated Caregiver: spouse Anticipated CAmbulance personInformation: see contacts Ability/Limitations of Caregiver: no limitations Caregiver Availability: 24/7 Discharge Plan Discussed with Primary Caregiver: Yes Is Caregiver In Agreement with Plan?: Yes Does Caregiver/Family have Issues with Lodging/Transportation while Pt is in Rehab?: No  Goals Patient/Family Goal for Rehab:  supervision with PT, OT and SLP Expected length of stay: ELOS 10 to 12 days Additional Information: [facility] was visit[facility] area when had CVA Program Orientation Provided & Reviewed with Pt/Caregiver Including Roles  & Responsibilities: Yes  Decrease burden of Care through IP rehab admission: n/a  Possible need for SNF placement upon discharge: not anticipated  Patient Condition: [facility] spoken with CM, and patient and spouse. I met with patient at the bedside for inpatient rehabilitation assessment.  Patient will benefit from ongoing PT, OT, and SLP, can actively participate in 3 hours of therapy a day 5 days of the week, and can make measurable gains during the admission.  Patient will also benefit from the coordinated team approach during an Inpatient Acute Rehabilitation admission.  The patient will receive intensive therapy as well as Rehabilitation physician, nursing, social worker, and care management interventions.  Due to bladder management, bowel management, safety, skin/wound care, disease management, medication administration, pain management, and patient education the patient requires 24 hour a day rehabilitation nursing.  The patient is currently mod assist overall with mobility and basic ADLs.  Discharge setting and therapy post discharge at home with home  health is anticipated.  Patient has agreed to participate in the Acute Inpatient Rehabilitation Program and will admit today.  Preadmission Screen Completed By:  Cleatrice Burke, 12/25/2021 10:40 AM ______________________________________________________________________   Discussed status with Dr. Dagoberto Ligas on 12/25/21 at 1040 and received approval for admission today.  Admission Coordinator:  Cleatrice Burke, RN, time 3154 Date 12/25/21   Assessment/Plan: Diagnosis: Does the need for close, 24 hr/day Medical supervision in concert with the  patient's rehab needs make it unreasonable for this patient to be served in a less intensive setting? 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 Yes Does the patient require coordinated care of a physician, rehab nurse, PT, OT, and SLP to address physical and functional deficits in the context of the above medical diagnosis(es)? Yes Addressing deficits in the following areas: balance, endurance, locomotion, strength, transferring, bowel/bladder control, bathing, dressing, feeding, grooming, toileting, cognition, speech, language, and swallowing Can the patient actively participate in an intensive therapy program of at least 3 hrs of therapy 5 days a week? Yes The potential for patient to make measurable gains while on inpatient rehab is good Anticipated functional outcomes upon discharge from inpatient rehab: supervision PT, supervision OT, supervision SLP Estimated rehab length of stay to reach the above functional goals is: 10-12 days Anticipated discharge destination: Home 10. Overall Rehab/Functional Prognosis: good   MD Signature:

## 2021-12-24 NOTE — Progress Notes (Signed)
  Inpatient Rehabilitation Admissions Coordinator   Met with patient and spouse at bedside for rehab assessment. We discussed goals and expectations of a possible CIR admit. They prefer CIR for rehab. Family can provide expected caregiver support that is recommended. I will verify bed availability over the next 24 to 48 hrs and correct his payor information with the pre service center.I will follow up tomorrow. Please call me with any questions.   Danne Baxter, RN, MSN Rehab Admissions Coordinator [phone]

## 2021-12-24 NOTE — Progress Notes (Signed)
PROGRESS NOTE    Lauren [name]  GMW:[id number] DOB: 07/18/1950 DOA: 12/20/2021 PCP: Malachy Moan, MD   Brief Narrative:  71 year old woman with hx of CAD, HTN, DM2, tremor disorder, prior hemorrhagic stroke, HLD, thrombocytosis who presents after waking up falling out of bed with left sided weakness, slurred speech, and left facial droop.  Last seen normal last night ~8PM.  Unfortunately imaging shows completed R M1 MCA stroke and she is out of window for intervention.  Does have some developing midline shift so PCCM consulted for admission.   Assessment & Plan:   Principal Problem:   Acute ischemic stroke ([place])   Acute R MCA stroke with evidence of brain compression/edema, recent h/o hemorrhagic stroke 5/23- -unfortunately outside window for any intervention  -Neurology followed and signed off.  Follow-up with neurology in 2 weeks. -Continue PT OT speech evaluation -MBS completed on 12/24/2021, recommendations for dysphagia 3 diet.  She has very minimal weakness on the left upper extremity and left lower extremity.  Speech is clear but she is fidgety.  Likely anxious at her baseline.  She has been started on only aspirin 81 mg p.o. daily instead of DAPT due to previous history of large ICH/hemorrhagic stroke.  PT OT recommends CIR.   COVID-19 -Initial testing 12/14/2021, has completed quarantine given her asymptomatic outpatient testing only requires 5 days of quarantine - she does not need further contact precautions -No further indication for repeat testing, imaging or intervention   Chronic tremor-  -Continue propranolol   H/o perforated gastric ulcer  Continue home PPI  COPD without acute exacerbation Tobacco dependence, ongoing On Breztri PTA, follws with Dr. Alvin Critchley Continue bronchodilators currently on Brovana and Yupelri while in the hospital   H/o CAD, HTN and HLD  with prior PCI Continue to follow clinically -continue aspirin 81mg    Prior lung ca R side s/p SBRT  , prior breast cancer  - s/p lumpectomy 2011   H/o Bipolar 1 and ADHD Continue Seroquel, BuSpar, and Lamictal   DJD As needed Tylenol   H/o OSA CPAP offered  DVT prophylaxis: Holding given above, early ambulation Code Status: Full Family Communication: Husband at bedside  Status is: Inpatient  Dispo: The patient is from: Home              Anticipated d/c is to: CIR, waiting for placement  Consultants:  Neuro, PCCM  Procedures:  None  Antimicrobials:  None  Subjective:  Patient seen and examined.  Husband at the bedside.  She has no new complaint other than left-sided weakness.  Objective: Vitals:   12/24/21 0346 12/24/21 0900 12/24/21 0944 12/24/21 1100  BP: 108/68   (!) 140/74  Pulse: 79   85  Resp: 18   16  Temp: 98.1 F (36.7 C)   98 F (36.7 C)  TempSrc:    Oral  SpO2: 94% (!) 89% 94% 100%  Weight:      Height:        Intake/Output Summary (Last 24 hours) at 12/24/2021 1318 Last data filed at 12/23/2021 1800 Gross per 24 hour  Intake 240 ml  Output --  Net 240 ml    Filed Weights   12/20/21 0755 12/20/21 0805  Weight: 44 kg 44 kg    Examination:  General exam: Appears anxious and fidgety with tremors. Respiratory system: Clear to auscultation. Respiratory effort normal. Cardiovascular system: S1 & S2 heard, RRR. No JVD, murmurs, rubs, gallops or clicks. No pedal edema. Gastrointestinal system: Abdomen is  nondistended, soft and nontender. No organomegaly or masses felt. Normal bowel sounds heard. Central nervous system: Alert and oriented.  Left hemiparesis.  4/5 power in left upper and lower extremity. Extremities: Symmetric 5 x 5 power. Skin: No rashes, lesions or ulcers.  Psychiatry: Judgement and insight appear normal. Mood & affect appropriate.   Data Reviewed: I have personally reviewed following labs and imaging studies  CBC: Recent Labs  Lab 12/20/21 0755 12/20/21 0757 12/21/21 0324 12/22/21 0313 12/23/21 0411 12/24/21 0726   WBC 6.5  --  4.0 4.9 6.1 9.0  NEUTROABS 4.2  --   --   --   --   --   HGB 14.6 15.3* 14.2 13.6 13.0 13.2  HCT 45.1 45.0 42.5 43.2 41.7 40.8  MCV 89.8  --  87.8 90.4 90.3 88.1  PLT 258  --  293 276 236 656    Basic Metabolic Panel: Recent Labs  Lab 12/20/21 0755 12/20/21 0757 12/20/21 1450 12/21/21 0324 12/21/21 1032 12/21/21 1626 12/21/21 2135 12/22/21 0313 12/23/21 0411 12/24/21 0726  NA 141 140   < > 145   < > 146* 146* 145 141 139  K 4.0 3.9  --  3.4*  --   --   --  4.1 3.6 3.7  CL 107 106  --  111  --   --   --  115* 110 105  CO2 25  --   --  23  --   --   --  24 26 27   GLUCOSE 91 95  --  103*  --   --   --  113* 92 95  BUN 6* 8  --  6*  --   --   --  12 16 14   CREATININE 0.54 0.50  --  0.39*  --   --   --  0.43* 0.40* 0.44  CALCIUM 8.7*  --   --  8.4*  --   --   --  8.8* 8.5* 8.7*   < > = values in this interval not displayed.    GFR: Estimated Creatinine Clearance: 46.1 mL/min (by C-G formula based on SCr of 0.44 mg/dL). Liver Function Tests: Recent Labs  Lab 12/20/21 0755  AST 26  ALT 23  ALKPHOS 84  BILITOT 0.8  PROT 6.2*  ALBUMIN 3.1*    No results for input(s): "LIPASE", "AMYLASE" in the last 168 hours. No results for input(s): "AMMONIA" in the last 168 hours. Coagulation Profile: Recent Labs  Lab 12/20/21 0755  INR 0.9    Cardiac Enzymes: No results for input(s): "CKTOTAL", "CKMB", "CKMBINDEX", "TROPONINI" in the last 168 hours. BNP (last 3 results) No results for input(s): "PROBNP" in the last 8760 hours. HbA1C: No results for input(s): "HGBA1C" in the last 72 hours.  CBG: Recent Labs  Lab 12/20/21 0753 12/21/21 0905  GLUCAP 106* 97    Lipid Profile: No results for input(s): "CHOL", "HDL", "LDLCALC", "TRIG", "CHOLHDL", "LDLDIRECT" in the last 72 hours.  Thyroid Function Tests: No results for input(s): "TSH", "T4TOTAL", "FREET4", "T3FREE", "THYROIDAB" in the last 72 hours. Anemia Panel: No results for input(s): "VITAMINB12",  "FOLATE", "FERRITIN", "TIBC", "IRON", "RETICCTPCT" in the last 72 hours. Sepsis Labs: No results for input(s): "PROCALCITON", "LATICACIDVEN" in the last 168 hours.  Recent Results (from the past 240 hour(s))  MRSA Next Gen by PCR, Nasal     Status: None   Collection Time: 12/20/21 11:10 PM   Specimen: Nasal Mucosa; Nasal Swab  Result Value Ref Range Status  MRSA by PCR Next Gen NOT DETECTED NOT DETECTED Final    Comment: (NOTE) The GeneXpert MRSA Assay (FDA approved for NASAL specimens only), is one component of a comprehensive MRSA colonization surveillance program. It is not intended to diagnose MRSA infection nor to guide or monitor treatment for MRSA infections. Test performance is not FDA approved in patients less than 44 years old. Performed at [facility], [place] [address], [place], [place] [address]          Radiology Studies: DG Swallowing Func-Speech Pathology  Result Date: 12/24/2021 Table formatting from the original result was not included. Objective Swallowing Evaluation: Type of Study: MBS-Modified Barium Swallow Study  Patient Details Name: Lauren [name] MRN: [id number] Date of Birth: [birth year] Today's Date: 12/24/2021 Time: SLP Start Time (ACUTE ONLY): 0820 -SLP Stop Time (ACUTE ONLY): 0835 SLP Time Calculation (min) (ACUTE ONLY): 15 min Past Medical History: Past Medical History: Diagnosis Date  Diabetes mellitus without complication ([place])   Hypertension  Past Surgical History: No past surgical history on file. HPI: Patient is a 71 y.o. female with PMH: CAD, HTN, DM-2, COPD, GERD, lung nodules, right parotid mass,  tobacco dependency, ADD, stomach ulcers, tremor disorder, prior hemorrhagic stroke, HLD, thrombocytosis. She presented to the hospital on 12/19/21 with left sided weakness, slurred speech and left sided facial droop. Imaging showed moderately sized right MCA CVA including dense involvement of the right basal ganglia, insula and anterior operculum. She was  out of window for intervention; some developing midline shift. She failed [place] swallow with RN and SLP ordered for swallow evaluation.  Subjective: pleasant, alert, a little fidgety/anxious  Recommendations for follow up therapy are one component of a multi-disciplinary discharge planning process, led by the attending physician.  Recommendations may be updated based on patient status, additional functional criteria and insurance authorization. Assessment / Plan / Recommendation   12/24/2021   9:46 AM Clinical Impressions Clinical Impression Patient presents with a mild oropharyngeal and a mild-moderate esophageal dysphagia as per this modified barium swallow study. During oral phase, she exhibited decreased bolus cohesion with liquids and delays in mastication and anterior to posterior transit of puree and soft solid textures. During pharyngeal phase, swallow was initiated at level of vallecular sinus with puree and soft solids as well as with teaspoon sip of thin liquid. Swallow was initiated at level of pyriform sinus with thin and nectar thick liquids. Flash, trace penetration occured with thin liquids via cup sips and with nectar thick liquids. When taking larger sip of barium to try to aid in clearing soft solid barium stasis in esophagus (upper thoracic portion), patient did exhibit trace, sensed aspiration of nectar thick liquid barium during the swallow. SLP did observe that cervical esophagus appeared dilated and suspected osteophytes present. During sweep of esophagus, stasis of soft solid barium in upper thoracic portion of esophagus observed and sip of nectar thick liquids did aid in transiting fully; also observed possible dysmotility and retrograde movement of barium in distal esophagus. SLP is recommending to continue with Dys 3 (mechanical soft) solids and nectar thick liquids but to allow patient to have thin liquids in between meals. SLP Visit Diagnosis Dysphagia, oropharyngeal phase  (R13.12);Dysphagia, pharyngoesophageal phase (R13.14) Impact on safety and function Mild aspiration risk     12/24/2021   9:46 AM Treatment Recommendations Treatment Recommendations Therapy as outlined in treatment plan below     12/24/2021   9:58 AM Prognosis Prognosis for Safe Diet Advancement Good Barriers to Reach Goals Severity of  deficits   12/24/2021   9:46 AM Diet Recommendations SLP Diet Recommendations Dysphagia 3 (Mech soft) solids;Nectar thick liquid Liquid Administration via Cup;Straw Medication Administration Whole meds with liquid Compensations Minimize environmental distractions;Lingual sweep for clearance of pocketing Postural Changes Seated upright at 90 degrees;Remain semi-upright after after feeds/meals (Comment)     12/24/2021   9:46 AM Other Recommendations Oral Care Recommendations Oral care BID Follow Up Recommendations Acute inpatient rehab (3hours/day) Assistance recommended at discharge Frequent or constant Supervision/Assistance Functional Status Assessment Patient has had a recent decline in their functional status and demonstrates the ability to make significant improvements in function in a reasonable and predictable amount of time.   12/24/2021   9:46 AM Frequency and Duration  Speech Therapy Frequency (ACUTE ONLY) min 2x/week Treatment Duration 2 weeks     12/24/2021   9:37 AM Oral Phase Oral Phase Impaired Oral - Nectar Cup Weak lingual manipulation;Reduced posterior propulsion;Piecemeal swallowing Oral - Nectar Straw Weak lingual manipulation;Reduced posterior propulsion;Piecemeal swallowing Oral - Thin Teaspoon Weak lingual manipulation;Reduced posterior propulsion Oral - Thin Cup Reduced posterior propulsion;Decreased bolus cohesion;Weak lingual manipulation Oral - Puree Reduced posterior propulsion;Weak lingual manipulation Oral - Mech Soft Weak lingual manipulation;Reduced posterior propulsion;Impaired mastication Oral - Pill Reduced posterior propulsion;Weak lingual manipulation     12/24/2021   9:40 AM Pharyngeal Phase Pharyngeal Phase Impaired Pharyngeal- Nectar Cup Delayed swallow initiation-pyriform sinuses Pharyngeal- Nectar Straw Delayed swallow initiation-pyriform sinuses;Trace aspiration;Penetration/Aspiration during swallow;Reduced airway/laryngeal closure Pharyngeal Material enters airway, passes BELOW cords and not ejected out despite cough attempt by patient;Material enters airway, remains ABOVE vocal cords then ejected out Pharyngeal- Thin Teaspoon Delayed swallow initiation-vallecula Pharyngeal- Thin Cup Delayed swallow initiation-pyriform sinuses;Penetration/Aspiration during swallow Pharyngeal- Puree Reduced pharyngeal peristalsis Pharyngeal- Pill Reduced pharyngeal peristalsis    12/24/2021   9:44 AM Cervical Esophageal Phase  Cervical Esophageal Phase Impaired Cervical Esophageal Comment cervical esophagus appeared dilated and with suspected cervical osteophytes (no radiologist present to confirm) Sonia Baller, MA, CCC-SLP Speech Therapy                      Scheduled Meds:  amphetamine-dextroamphetamine  20 mg Oral Q breakfast   arformoterol  15 mcg Nebulization BID   aspirin EC  81 mg Oral Daily   atorvastatin  40 mg Oral QHS   busPIRone  30 mg Oral TID   Chlorhexidine Gluconate Cloth  6 each Topical Daily   hydrOXYzine  25 mg Oral Daily   lamoTRIgine  200 mg Oral Daily   pantoprazole  40 mg Oral Daily   propranolol ER  60 mg Oral Daily   QUEtiapine  200 mg Oral QHS   revefenacin  175 mcg Nebulization Daily   sodium chloride flush  3 mL Intravenous Once   Continuous Infusions:   LOS: 4 days   Darliss Cheney, MD [facility]  If 7PM-7AM, please contact night-coverage [email or web address]  12/24/2021, 1:18 PM

## 2021-12-25 ENCOUNTER — Encounter (HOSPITAL_COMMUNITY): Payer: Self-pay | Admitting: Physical Medicine and Rehabilitation

## 2021-12-25 ENCOUNTER — Encounter (HOSPITAL_COMMUNITY): Payer: Self-pay | Admitting: Internal Medicine

## 2021-12-25 ENCOUNTER — Other Ambulatory Visit: Payer: Self-pay

## 2021-12-25 ENCOUNTER — Inpatient Hospital Stay (HOSPITAL_COMMUNITY)
Admission: RE | Admit: 2021-12-25 | Discharge: 2022-01-09 | DRG: 057 | Disposition: A | Discharge status: Home / Self Care | Payer: Medicare Other | Source: Intra-hospital | Attending: Physical Medicine and Rehabilitation | Admitting: Physical Medicine and Rehabilitation

## 2021-12-25 DIAGNOSIS — I10 Essential (primary) hypertension: Secondary | ICD-10-CM | POA: Diagnosis present

## 2021-12-25 DIAGNOSIS — Z9884 Bariatric surgery status: Secondary | ICD-10-CM | POA: Diagnosis not present

## 2021-12-25 DIAGNOSIS — M5030 Other cervical disc degeneration, unspecified cervical region: Secondary | ICD-10-CM | POA: Diagnosis present

## 2021-12-25 DIAGNOSIS — F909 Attention-deficit hyperactivity disorder, unspecified type: Secondary | ICD-10-CM | POA: Diagnosis present

## 2021-12-25 DIAGNOSIS — F17210 Nicotine dependence, cigarettes, uncomplicated: Secondary | ICD-10-CM | POA: Diagnosis present

## 2021-12-25 DIAGNOSIS — I63511 Cerebral infarction due to unspecified occlusion or stenosis of right middle cerebral artery: Secondary | ICD-10-CM | POA: Diagnosis not present

## 2021-12-25 DIAGNOSIS — R251 Tremor, unspecified: Secondary | ICD-10-CM | POA: Diagnosis present

## 2021-12-25 DIAGNOSIS — I69354 Hemiplegia and hemiparesis following cerebral infarction affecting left non-dominant side: Principal | ICD-10-CM

## 2021-12-25 DIAGNOSIS — I25810 Atherosclerosis of coronary artery bypass graft(s) without angina pectoris: Secondary | ICD-10-CM | POA: Diagnosis not present

## 2021-12-25 DIAGNOSIS — G4733 Obstructive sleep apnea (adult) (pediatric): Secondary | ICD-10-CM | POA: Diagnosis present

## 2021-12-25 DIAGNOSIS — Z8659 Personal history of other mental and behavioral disorders: Secondary | ICD-10-CM

## 2021-12-25 DIAGNOSIS — R471 Dysarthria and anarthria: Secondary | ICD-10-CM | POA: Diagnosis present

## 2021-12-25 DIAGNOSIS — R29810 Facial weakness: Secondary | ICD-10-CM | POA: Diagnosis present

## 2021-12-25 DIAGNOSIS — F319 Bipolar disorder, unspecified: Secondary | ICD-10-CM | POA: Diagnosis present

## 2021-12-25 DIAGNOSIS — J439 Emphysema, unspecified: Secondary | ICD-10-CM | POA: Diagnosis present

## 2021-12-25 DIAGNOSIS — W19XXXA Unspecified fall, initial encounter: Secondary | ICD-10-CM | POA: Diagnosis not present

## 2021-12-25 DIAGNOSIS — Z833 Family history of diabetes mellitus: Secondary | ICD-10-CM

## 2021-12-25 DIAGNOSIS — I639 Cerebral infarction, unspecified: Secondary | ICD-10-CM | POA: Diagnosis not present

## 2021-12-25 DIAGNOSIS — I2510 Atherosclerotic heart disease of native coronary artery without angina pectoris: Secondary | ICD-10-CM | POA: Diagnosis present

## 2021-12-25 DIAGNOSIS — Z85118 Personal history of other malignant neoplasm of bronchus and lung: Secondary | ICD-10-CM | POA: Diagnosis not present

## 2021-12-25 DIAGNOSIS — R1310 Dysphagia, unspecified: Secondary | ICD-10-CM | POA: Diagnosis present

## 2021-12-25 DIAGNOSIS — E119 Type 2 diabetes mellitus without complications: Secondary | ICD-10-CM | POA: Diagnosis present

## 2021-12-25 DIAGNOSIS — J3489 Other specified disorders of nose and nasal sinuses: Secondary | ICD-10-CM | POA: Diagnosis not present

## 2021-12-25 DIAGNOSIS — Z79899 Other long term (current) drug therapy: Secondary | ICD-10-CM

## 2021-12-25 DIAGNOSIS — F419 Anxiety disorder, unspecified: Secondary | ICD-10-CM | POA: Diagnosis present

## 2021-12-25 HISTORY — DX: Atherosclerotic heart disease of native coronary artery without angina pectoris: I25.10

## 2021-12-25 HISTORY — DX: Bronchitis, not specified as acute or chronic: J40

## 2021-12-25 HISTORY — DX: Anxiety disorder, unspecified: F41.9

## 2021-12-25 HISTORY — DX: Bipolar disorder, unspecified: F31.9

## 2021-12-25 HISTORY — DX: Other cervical disc degeneration, unspecified cervical region: M50.30

## 2021-12-25 HISTORY — DX: Peptic ulcer, site unspecified, unspecified as acute or chronic, without hemorrhage or perforation: K27.9

## 2021-12-25 HISTORY — DX: Other diseases of salivary glands: K11.8

## 2021-12-25 HISTORY — DX: Nontraumatic intracerebral hemorrhage, unspecified: I61.9

## 2021-12-25 LAB — BASIC METABOLIC PANEL
Anion gap: 9 (ref 5–15)
BUN: 11 mg/dL (ref 8–23)
CO2: 26 mmol/L (ref 22–32)
Calcium: 8.9 mg/dL (ref 8.9–10.3)
Chloride: 105 mmol/L (ref 98–111)
Creatinine, Ser: 0.43 mg/dL — ABNORMAL LOW (ref 0.44–1.00)
GFR, Estimated: >60 mL/min (ref >60)
Glucose, Bld: 103 mg/dL — ABNORMAL HIGH (ref 70–99)
Potassium: 3.9 mmol/L (ref 3.5–5.1)
Sodium: 140 mmol/L (ref 135–145)

## 2021-12-25 LAB — CBC
HCT: 40.1 % (ref 36.0–46.0)
Hemoglobin: 13.2 g/dL (ref 12.0–15.0)
MCH: 28.6 pg (ref 26.0–34.0)
MCHC: 32.9 g/dL (ref 30.0–36.0)
MCV: 86.8 fL (ref 80.0–100.0)
Platelets: 385 10*3/uL (ref 150–400)
RBC: 4.62 MIL/uL (ref 3.87–5.11)
RDW: 13.5 % (ref 11.5–15.5)
WBC: 10.8 10*3/uL — ABNORMAL HIGH (ref 4.0–10.5)
nRBC: 0 % (ref 0.0–0.2)

## 2021-12-25 MED ORDER — GUAIFENESIN-DM 100-10 MG/5ML PO SYRP: 5.0000 mL | ORAL_SOLUTION | Freq: Four times a day (QID) | ORAL | Status: DC | PRN

## 2021-12-25 MED ORDER — HYDROXYZINE HCL 10 MG PO TABS: 10.0000 mg | ORAL_TABLET | Freq: Three times a day (TID) | ORAL | Status: DC | PRN

## 2021-12-25 MED ORDER — ASPIRIN 81 MG PO TBEC: 81.0000 mg | DELAYED_RELEASE_TABLET | Freq: Every day | ORAL | 0 refills | Status: DC

## 2021-12-25 MED ORDER — ASPIRIN 81 MG PO TBEC
81.0000 mg | DELAYED_RELEASE_TABLET | Freq: Every day | ORAL | Status: DC
  Administered 2021-12-26 – 2022-01-09 (×15): 81 mg via ORAL
  Filled 2021-12-25 (×16): qty 1

## 2021-12-25 MED ORDER — BISACODYL 10 MG RE SUPP: 10.0000 mg | Freq: Every day | RECTAL | Status: DC | PRN

## 2021-12-25 MED ORDER — BUDESON-GLYCOPYRROL-FORMOTEROL 160-9-4.8 MCG/ACT IN AERO
2.0000 | INHALATION_SPRAY | Freq: Two times a day (BID) | RESPIRATORY_TRACT | Status: DC
  Administered 2021-12-25 – 2022-01-09 (×25): 2 via RESPIRATORY_TRACT

## 2021-12-25 MED ORDER — MOMETASONE FURO-FORMOTEROL FUM 100-5 MCG/ACT IN AERO: 2.0000 | INHALATION_SPRAY | Freq: Two times a day (BID) | RESPIRATORY_TRACT | Status: DC

## 2021-12-25 MED ORDER — PANTOPRAZOLE SODIUM 40 MG PO TBEC
40.0000 mg | DELAYED_RELEASE_TABLET | Freq: Every day | ORAL | Status: DC
  Administered 2021-12-26 – 2022-01-09 (×15): 40 mg via ORAL
  Filled 2021-12-25 (×16): qty 1

## 2021-12-25 MED ORDER — AMPHETAMINE-DEXTROAMPHETAMINE 10 MG PO TABS
20.0000 mg | ORAL_TABLET | Freq: Every day | ORAL | Status: DC
  Administered 2021-12-26 – 2022-01-09 (×15): 20 mg via ORAL
  Filled 2021-12-25 (×17): qty 2

## 2021-12-25 MED ORDER — LAMOTRIGINE 100 MG PO TABS
200.0000 mg | ORAL_TABLET | Freq: Every day | ORAL | Status: DC
  Administered 2021-12-26 – 2022-01-09 (×15): 200 mg via ORAL
  Filled 2021-12-25 (×16): qty 2

## 2021-12-25 MED ORDER — PROCHLORPERAZINE MALEATE 5 MG PO TABS: 5.0000 mg | ORAL_TABLET | Freq: Four times a day (QID) | ORAL | Status: DC | PRN

## 2021-12-25 MED ORDER — ATORVASTATIN CALCIUM 40 MG PO TABS
40.0000 mg | ORAL_TABLET | Freq: Every day | ORAL | Status: DC
  Administered 2021-12-25 – 2022-01-08 (×15): 40 mg via ORAL
  Filled 2021-12-25 (×15): qty 1

## 2021-12-25 MED ORDER — NON FORMULARY: 2.0000 | Freq: Two times a day (BID) | Status: DC

## 2021-12-25 MED ORDER — CLOPIDOGREL BISULFATE 75 MG PO TABS: 75.0000 mg | ORAL_TABLET | Freq: Every day | ORAL | Status: DC

## 2021-12-25 MED ORDER — PROCHLORPERAZINE 25 MG RE SUPP: 12.5000 mg | Freq: Four times a day (QID) | RECTAL | Status: DC | PRN

## 2021-12-25 MED ORDER — BUDESON-GLYCOPYRROL-FORMOTEROL 160-9-4.8 MCG/ACT IN AERO: 2.0000 | INHALATION_SPRAY | Freq: Two times a day (BID) | RESPIRATORY_TRACT | Status: DC

## 2021-12-25 MED ORDER — HYDROXYZINE HCL 25 MG PO TABS
25.0000 mg | ORAL_TABLET | Freq: Every day | ORAL | Status: DC
  Administered 2021-12-26 – 2021-12-31 (×6): 25 mg via ORAL
  Filled 2021-12-25 (×6): qty 1

## 2021-12-25 MED ORDER — MOMETASONE FURO-FORMOTEROL FUM 100-5 MCG/ACT IN AERO
2.0000 | INHALATION_SPRAY | Freq: Two times a day (BID) | RESPIRATORY_TRACT | Status: DC
  Filled 2021-12-25: qty 8.8

## 2021-12-25 MED ORDER — ADULT MULTIVITAMIN W/MINERALS CH
1.0000 | ORAL_TABLET | Freq: Every day | ORAL | Status: DC
  Administered 2021-12-25 – 2022-01-09 (×16): 1 via ORAL
  Filled 2021-12-25 (×18): qty 1

## 2021-12-25 MED ORDER — PROPRANOLOL HCL ER 60 MG PO CP24
60.0000 mg | ORAL_CAPSULE | Freq: Every day | ORAL | Status: DC
  Administered 2021-12-26 – 2022-01-09 (×14): 60 mg via ORAL
  Filled 2021-12-25 (×15): qty 1

## 2021-12-25 MED ORDER — BUSPIRONE HCL 15 MG PO TABS
30.0000 mg | ORAL_TABLET | Freq: Three times a day (TID) | ORAL | Status: DC
  Administered 2021-12-25 – 2022-01-09 (×45): 30 mg via ORAL
  Filled 2021-12-25 (×49): qty 2

## 2021-12-25 MED ORDER — TRAZODONE HCL 50 MG PO TABS
50.0000 mg | ORAL_TABLET | Freq: Every day | ORAL | Status: DC
  Administered 2021-12-25 – 2022-01-08 (×15): 50 mg via ORAL
  Filled 2021-12-25 (×16): qty 1

## 2021-12-25 MED ORDER — QUETIAPINE FUMARATE 50 MG PO TABS
200.0000 mg | ORAL_TABLET | Freq: Every day | ORAL | Status: DC
  Administered 2021-12-25 – 2022-01-08 (×15): 200 mg via ORAL
  Filled 2021-12-25 (×16): qty 4

## 2021-12-25 MED ORDER — SENNOSIDES-DOCUSATE SODIUM 8.6-50 MG PO TABS: 2.0000 | ORAL_TABLET | Freq: Every evening | ORAL | Status: DC | PRN

## 2021-12-25 MED ORDER — ACETAMINOPHEN 325 MG PO TABS
325.0000 mg | ORAL_TABLET | ORAL | Status: DC | PRN
  Administered 2021-12-25 – 2022-01-02 (×6): 650 mg via ORAL
  Filled 2021-12-25 (×7): qty 2

## 2021-12-25 MED ORDER — ALUM & MAG HYDROXIDE-SIMETH 200-200-20 MG/5ML PO SUSP: 30.0000 mL | ORAL | Status: DC | PRN

## 2021-12-25 MED ORDER — CLOPIDOGREL BISULFATE 75 MG PO TABS
75.0000 mg | ORAL_TABLET | Freq: Every day | ORAL | Status: DC
  Administered 2021-12-25 – 2022-01-09 (×16): 75 mg via ORAL
  Filled 2021-12-25 (×17): qty 1

## 2021-12-25 MED ORDER — ENOXAPARIN SODIUM 30 MG/0.3ML IJ SOSY
30.0000 mg | PREFILLED_SYRINGE | INTRAMUSCULAR | Status: DC
  Administered 2021-12-25 – 2022-01-08 (×15): 30 mg via SUBCUTANEOUS
  Filled 2021-12-25 (×15): qty 0.3

## 2021-12-25 MED ORDER — FLEET ENEMA 7-19 GM/118ML RE ENEM: 1.0000 | ENEMA | Freq: Once | RECTAL | Status: DC | PRN

## 2021-12-25 MED ORDER — ASPIRIN 81 MG PO TBEC: 81.0000 mg | DELAYED_RELEASE_TABLET | Freq: Every day | ORAL | Status: DC

## 2021-12-25 MED ORDER — LEVALBUTEROL HCL 0.63 MG/3ML IN NEBU
0.6300 mg | INHALATION_SOLUTION | Freq: Four times a day (QID) | RESPIRATORY_TRACT | Status: DC | PRN
  Administered 2021-12-26 – 2022-01-02 (×12): 0.63 mg via RESPIRATORY_TRACT
  Filled 2021-12-25 (×12): qty 3

## 2021-12-25 MED ORDER — CALCIUM CARBONATE 1250 (500 CA) MG PO TABS
1250.0000 mg | ORAL_TABLET | Freq: Every day | ORAL | Status: DC
  Administered 2021-12-26 – 2022-01-09 (×15): 1250 mg via ORAL
  Filled 2021-12-25 (×17): qty 1

## 2021-12-25 MED ORDER — PROCHLORPERAZINE EDISYLATE 10 MG/2ML IJ SOLN: 5.0000 mg | Freq: Four times a day (QID) | INTRAMUSCULAR | Status: DC | PRN

## 2021-12-25 MED ORDER — POLYETHYLENE GLYCOL 3350 17 G PO PACK: 17.0000 g | PACK | Freq: Every day | ORAL | Status: DC | PRN

## 2021-12-25 MED ORDER — DIPHENHYDRAMINE HCL 12.5 MG/5ML PO ELIX: 12.5000 mg | ORAL_SOLUTION | Freq: Four times a day (QID) | ORAL | Status: DC | PRN

## 2021-12-25 MED ORDER — UMECLIDINIUM BROMIDE 62.5 MCG/ACT IN AEPB
1.0000 | INHALATION_SPRAY | Freq: Every day | RESPIRATORY_TRACT | Status: DC
  Filled 2021-12-25: qty 7

## 2021-12-25 MED ORDER — UMECLIDINIUM BROMIDE 62.5 MCG/ACT IN AEPB: 1.0000 | INHALATION_SPRAY | Freq: Every day | RESPIRATORY_TRACT | Status: DC

## 2021-12-25 NOTE — Progress Notes (Addendum)
Inpatient Rehabilitation Admission Medication Review by a Pharmacist  A complete drug regimen review was completed for this patient to identify any potential clinically significant medication issues.  High Risk Drug Classes Is patient taking? Indication by Medication  Antipsychotic Yes Compazine - prn nausea  Anticoagulant Yes Lovenox- VTE prevention  Antibiotic No   Opioid No   Antiplatelet Yes ASA- antiplatelet therapy  Hypoglycemics/insulin No   Vasoactive Medication Yes Propranolol ER-   Chemotherapy No   Other Yes Atorvastatin- HLD Adderall- ADHD Lamotrigine, Seroquel- bipolar disease Buspar- antianxiety Dulera/Incruse (formulary substitution for Breztri)- COPD Protonix- h/o perforated gastric ulcer Trazodone - sleep     Type of Medication Issue Identified Description of Issue Recommendation(s)  Drug Interaction(s) (clinically significant)     Duplicate Therapy     Allergy     No Medication Administration End Date     Incorrect Dose     Additional Drug Therapy Needed  Dr. Leonie Man recommended adding Plavix to aspirin at the time of discharge Inpatient Rehab PA noted she will contact Neuro re: addition of Plavix.   Significant med changes from prior encounter (inform family/care partners about these prior to discharge).     Other       Clinically significant medication issues were identified that warrant physician communication and completion of prescribed/recommended actions by midnight of the next day:  No  Name of provider notified for urgent issues identified:   Provider Method of Notification:    Pharmacist comments:   Will follow up with PA tomorrow 12/26/21 for plan for Plavix as noted above.  Time spent performing this drug regimen review (minutes):  Garrett, [place] Clinical Pharmacist 12/25/2021 2:44 PM

## 2021-12-25 NOTE — Progress Notes (Signed)
[name], MD  Physician Other PMR Pre-admission    Signed Date of Service:  12/24/2021  7:10 PM  Related encounter: ED to Hosp-Admission (Discharged) from 12/20/2021 in [facility]   Signed      PMR Admission Coordinator Pre-Admission Assessment   Patient: Lauren [name] is an 71 y.o., female MRN: [id number] DOB: 07-18-1952Height: 5' (152.4 cm) Weight: 44 kg   Insurance Information HMO:     PPO:      PCP:      IPA:      80/20:      OTHER:  PRIMARY: Medicare a and b      Policy#: [id number]      Subscriber: pt Benefits:  Phone #: passport one source     Name: 9/27 Eff. Date: 02/27/2017     Deduct: $1600      Out of Pocket Max: none      Life Max: none CIR: 100%      SNF: 20 full days Outpatient: 80%     Co-Pay: 20% Home Health: 100%      Co-Pay: none DME: 80%     Co-Pay: 20% Providers: pt choice  SECONDARY: Roseanna Rainbow      Policy#: [id number]   THIRD: Tricare for LIFE   policy # [id number]   Financial Counselor:       Phone#:    The "Data Collection Information Summary" for patients in Inpatient Rehabilitation Facilities with attached "Privacy Act [place] Records" was provided and verbally reviewed with: Patient and Family   Emergency Contact Information Contact Information       Name Relation Home Work Mobile    [place] Spouse     [phone]    Harrington,Nikole Daughter     [phone]         Current Medical History  Patient Admitting Diagnosis: CVA   History of Present Illness: 71 year old female with history of CAD, HTN, DM2, tremor disorder, prior hemorrhagic CVA, HLD, thrombocytosis and lung cancer, breast cancer, parotid lesions and Bipolar  who presented on 12/20/21 after waking up and falling out of bed with left sided weakness, slurred speech and left facial droop. Imaging showed R M1 MCA CVA.   Neurology consulted. CTA head and neck with perfusion, positive for large vessel occlusion of the right MCA mid M 1  Segment, but pseudo normalization of the right MCA infarct and moderately reconstituted appearance of the right MCA branches .This constellation did not seem favorable for endovascular reperfusion. Significant stenosis; right ICA origin, 64%. Proximal left subclavian artery, 72 %. 2 d echo ef 50 to 55%, LDL 103, Hb A1c 5.8.No antithrombotic pta, now on ASA. To allow permissive HTN. Resumed Atorvastatin. Obstructive sleep apnea does not uses CPAP. No signs of metastatic CA on MRI. Essential tremors in upper extremities and head, seen 11/20/21 as op Neurology. Recommend propanolol.  Continue home meds for Bipolar.    Complete NIHSS TOTAL: 5   Patient's medical record from [facility] has been reviewed by the rehabilitation admission coordinator and physician.   Past Medical History      Past Medical History:  Diagnosis Date   Diabetes mellitus without complication ([place])     Hypertension      Has the patient had major surgery during 100 days prior to admission? No   Family History   family history is not on file.   Current Medications   Current Facility-Administered Medications:    acetaminophen (TYLENOL) tablet 650 mg,  650 mg, Oral, Q4H PRN, 650 mg at 12/25/21 0501 **OR** acetaminophen (TYLENOL) 160 MG/5ML solution 650 mg, 650 mg, Per Tube, Q4H PRN **OR** acetaminophen (TYLENOL) suppository 650 mg, 650 mg, Rectal, Q4H PRN, Candee Furbish, MD, 650 mg at 12/20/21 1854   amphetamine-dextroamphetamine (ADDERALL) tablet 20 mg, 20 mg, Oral, Q breakfast, Little Ishikawa, MD, 20 mg at 12/25/21 0830   arformoterol (BROVANA) nebulizer solution 15 mcg, 15 mcg, Nebulization, BID, Candee Furbish, MD, 15 mcg at 12/25/21 0849   aspirin EC tablet 81 mg, 81 mg, Oral, Daily, Shafer, Devon, NP, 81 mg at 12/25/21 0830   atorvastatin (LIPITOR) tablet 40 mg, 40 mg, Oral, QHS, Erick Colace, NP, 40 mg at 12/24/21 2040   busPIRone (BUSPAR) tablet 30 mg, 30 mg, Oral, TID, Erick Colace, NP, 30 mg  at 12/25/21 0830   Chlorhexidine Gluconate Cloth 2 % PADS 6 each, 6 each, Topical, Daily, Candee Furbish, MD, 6 each at 12/25/21 0831   enoxaparin (LOVENOX) injection 30 mg, 30 mg, Subcutaneous, Q24H, Pahwani, Ravi, MD, 30 mg at 12/24/21 2041   food thickener (SIMPLYTHICK (NECTAR/LEVEL 2/MILDLY THICK)) 1 packet, 1 packet, Oral, PRN, Erick Colace, NP   hydrOXYzine (ATARAX) tablet 25 mg, 25 mg, Oral, Daily, Little Ishikawa, MD, 25 mg at 12/25/21 0830   ipratropium-albuterol (DUONEB) 0.5-2.5 (3) MG/3ML nebulizer solution 3 mL, 3 mL, Nebulization, Q2H PRN, Little Ishikawa, MD   lamoTRIgine (LAMICTAL) tablet 200 mg, 200 mg, Oral, Daily, Salvadore Dom E, NP, 200 mg at 12/25/21 0830   Oral care mouth rinse, 15 mL, Mouth Rinse, PRN, Candee Furbish, MD   pantoprazole (PROTONIX) EC tablet 40 mg, 40 mg, Oral, Daily, Bell, Lorin C, RPH, 40 mg at 12/25/21 0830   propranolol ER (INDERAL LA) 24 hr capsule 60 mg, 60 mg, Oral, Daily, Little Ishikawa, MD, 60 mg at 12/25/21 0830   QUEtiapine (SEROQUEL) tablet 200 mg, 200 mg, Oral, QHS, Erick Colace, NP, 200 mg at 12/24/21 2041   revefenacin (YUPELRI) nebulizer solution 175 mcg, 175 mcg, Nebulization, Daily, Candee Furbish, MD, 175 mcg at 12/25/21 0854   senna-docusate (Senokot-S) tablet 1 tablet, 1 tablet, Oral, QHS PRN, Candee Furbish, MD   sodium chloride flush (NS) 0.9 % injection 3 mL, 3 mL, Intravenous, Once, Scheving, Livingston Diones, MD   Patients Current Diet:  Diet Order                  DIET DYS 3 Room service appropriate? Yes with Assist; Fluid consistency: Nectar Thick  Diet effective now                       Precautions / Restrictions Precautions Precautions: Fall Precaution Comments: watch O2 Restrictions Weight Bearing Restrictions: No    Has the patient had 2 or more falls or a fall with injury in the past year? No   Prior Activity Level Community (5-7x/wk): Mod I with cane   Prior Functional Level Self  Care: Did the patient need help bathing, dressing, using the toilet or eating? Independent   Indoor Mobility: Did the patient need assistance with walking from room to room (with or without device)? Independent   Stairs: Did the patient need assistance with internal or external stairs (with or without device)? Independent   Functional Cognition: Did the patient need help planning regular tasks such as shopping or remembering to take medications? Independent   Patient Information Are you of  Hispanic, Latino/a,or Spanish origin?: A. No, not of Hispanic, Latino/a, or Spanish origin What is your race?: A. White Do you need or want an interpreter to communicate with a doctor or health care staff?: 0. No   Patient's Response To:  Health Literacy and Transportation Is the patient able to respond to health literacy and transportation needs?: Yes Health Literacy - How often do you need to have someone help you when you read instructions, pamphlets, or other written material from your doctor or pharmacy?: Never In the past 12 months, has lack of transportation kept you from medical appointments or from getting medications?: No In the past 12 months, has lack of transportation kept you from meetings, work, or from getting things needed for daily living?: No   Home Assistive Devices / Equipment Home Equipment: Conservation officer, nature (2 wheels), [employer] - quad, Grab bars - toilet, Tub bench   Prior Device Use: Indicate devices/aids used by the patient prior to current illness, exacerbation or injury?  cane   Current Functional Level Cognition   Arousal/Alertness: Awake/alert Overall Cognitive Status: Impaired/Different from baseline Current Attention Level: Focused Orientation Level: Oriented X4 Following Commands: Follows one step commands consistently, Follows one step commands with increased time Safety/Judgement: Decreased awareness of safety, Decreased awareness of deficits General Comments: pt  requires constant redirection to task, aware of need to have BM during session. Difficulty sustaining attention to 1 task in quiet enviorment. poor recall throughout session Attention: Selective Selective Attention: Impaired Selective Attention Impairment: Verbal basic Memory: Impaired Memory Impairment: Retrieval deficit Awareness: Impaired Awareness Impairment: Intellectual impairment Safety/Judgment: Impaired    Extremity Assessment (includes Sensation/Coordination)   Upper Extremity Assessment: Defer to OT evaluation RUE Deficits / Details: generally weak but ROM is WFL. Per pt and family, pt had full return of R hemibody since CVA in 05/23 RUE Sensation: WNL RUE Coordination: WNL LUE Deficits / Details: Overall 3/5 MMT. FUll PROM but able to move actively through about 50% of range. Able to hold shoulder flexion against gravity for ~5 seconds without drift. Needed cues to use functionally to don sock and use on RW LUE Sensation: decreased light touch, decreased proprioception LUE Coordination: decreased fine motor, decreased gross motor  Lower Extremity Assessment: RLE deficits/detail, LLE deficits/detail RLE Deficits / Details: grossly WFL to MMT in bed, able to use functionally for OOB mobility. reports sensation intact after last stroke RLE Sensation: WNL RLE Coordination: WNL LLE Deficits / Details: grossly functional to MMT in bed, good ability to move against gravity with direct cues, but poor functional use. sensation impaired compared to RLE LLE Sensation: decreased light touch LLE Coordination: decreased fine motor, decreased gross motor     ADLs   Overall ADL's : Needs assistance/impaired Eating/Feeding: Minimal assistance, Sitting Grooming: Minimal assistance, Wash/dry hands, Standing Upper Body Bathing: Minimal assistance, Sitting Lower Body Bathing: Moderate assistance, Sit to/from stand Upper Body Dressing : Minimal assistance, Sitting Upper Body Dressing Details  (indicate cue type and reason): donning new gown, attending to L UE Lower Body Dressing: Moderate assistance, Sit to/from stand Lower Body Dressing Details (indicate cue type and reason): assist for L sock, min assist in standing Toilet Transfer: Minimal assistance, Ambulation, Rolling walker (2 wheels), Grab bars Toileting- Clothing Manipulation and Hygiene: Moderate assistance, Sit to/from stand Toileting - Clothing Manipulation Details (indicate cue type and reason): assist to manage briefs off (soiled), min assist to complete hygiene Functional mobility during ADLs: Minimal assistance, Rolling walker (2 wheels), Cueing for sequencing, Cueing for safety General  ADL Comments: remains limited by L sided weakness, impaired cognition     Mobility   Overal bed mobility: Needs Assistance Bed Mobility: Supine to Sit Supine to sit: Min assist General bed mobility comments: min assist to initate, sustain attention and complete task.     Transfers   Overall transfer level: Needs assistance Equipment used: Rolling walker (2 wheels) Transfers: Sit to/from Stand Sit to Stand: Min assist General transfer comment: to power up and steady, cueing for L hand placement and attention.     Ambulation / Gait / Stairs / Wheelchair Mobility   Ambulation/Gait Ambulation/Gait assistance: Min assist, +2 safety/equipment Gait Distance (Feet): 5 Feet (+ 20f) Assistive device: 2 person hand held assist, Rolling walker (2 wheels) Gait Pattern/deviations: Step-through pattern, Knee hyperextension - left, Decreased step length - left, Decreased stride length General Gait Details: pt with poor functional use and movement of LLE with gait. increased cues to advance appropriately and cues for increased clearance. improved stability with use of RW, VSS on 2L with gait Gait velocity: decreased Gait velocity interpretation: <1.31 ft/sec, indicative of household ambulator     Posture / Balance Balance Overall balance  assessment: Needs assistance Sitting-balance support: No upper extremity supported, Feet supported Sitting balance-Leahy Scale: Fair Standing balance support: Bilateral upper extremity supported, During functional activity, No upper extremity supported Standing balance-Leahy Scale: Poor Standing balance comment: relies on BUE and external support     Special needs/care consideration COVID + 9/17. Completed isolation 9/27 Hgb A1c 5.8 Smoker    Previous Home Environment  Living Arrangements: Spouse/significant other  Lives With: Spouse Available Help at Discharge: Available 24 hours/day Type of Home: House Home Layout: Two level Alternate Level Stairs-Rails: Right Alternate Level Stairs-Number of Steps: flight. Full bath in Basement. Bath on main level is being renovated Home Access: Stairs to enter Entrance Stairs-Rails: None ETechnical brewerof Steps: 1+1 carport Bathroom Shower/Tub: WMultimedia programmer Handicapped height Bathroom Accessibility: Yes How Accessible: Accessible via walker [place] No   Discharge Living Setting Plans for Discharge Living Setting: Patient's home, Lives with (comment) (spouse) Type of Home at Discharge: House Discharge Home Layout: Two level, Able to live on main level with bedroom/bathroom Alternate Level Stairs-Rails: Right Alternate Level Stairs-Number of Steps: flight Discharge Home Access: Stairs to enter Entrance Stairs-Rails: None Entrance Stairs-Number of Steps: 1 + 1 carport Discharge Bathroom Shower/Tub: Walk-in shower Discharge Bathroom Toilet: Handicapped height Discharge Bathroom Accessibility: Yes How Accessible: Accessible via walker Does the patient have any problems obtaining your medications?: No   Social/Family/Support Systems Patient Roles: Spouse Contact Information: spouse , DShanon BrowAnticipated Caregiver: spouse Anticipated CAmbulance personInformation: see contacts Ability/Limitations of  Caregiver: no limitations Caregiver Availability: 24/7 Discharge Plan Discussed with Primary Caregiver: Yes Is Caregiver In Agreement with Plan?: Yes Does Caregiver/Family have Issues with Lodging/Transportation while Pt is in Rehab?: No   Goals Patient/Family Goal for Rehab: supervision with PT, OT and SLP Expected length of stay: ELOS 10 to 12 days Additional Information: [facility] area when had CVA Program Orientation Provided & Reviewed with Pt/Caregiver Including Roles  & Responsibilities: Yes   Decrease burden of Care through IP rehab admission: n/a   Possible need for SNF placement upon discharge: not anticipated   Patient Condition: [facility] spoken with CM, and patient and spouse. I met with patient at the bedside for inpatient rehabilitation assessment.  Patient will benefit  from ongoing PT, OT, and SLP, can actively participate in 3 hours of therapy a day 5 days of the week, and can make measurable gains during the admission.  Patient will also benefit from the coordinated team approach during an Inpatient Acute Rehabilitation admission.  The patient will receive intensive therapy as well as Rehabilitation physician, nursing, social worker, and care management interventions.  Due to bladder management, bowel management, safety, skin/wound care, disease management, medication administration, pain management, and patient education the patient requires 24 hour a day rehabilitation nursing.  The patient is currently mod assist overall with mobility and basic ADLs.  Discharge setting and therapy post discharge at home with home health is anticipated.  Patient has agreed to participate in the Acute Inpatient Rehabilitation Program and will admit today.   Preadmission Screen Completed By:  Cleatrice Burke, 12/25/2021 10:40  AM ______________________________________________________________________   Discussed status with Dr. Dagoberto Ligas on 12/25/21 at 1040 and received approval for admission today.   Admission Coordinator:  Cleatrice Burke, RN, time 1610 Date 12/25/21    Assessment/Plan: Diagnosis: Does the need for close, 24 hr/day Medical supervision in concert with the patient's rehab needs make it unreasonable for this patient to be served in a less intensive setting? 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 Yes Does the patient require coordinated care of a physician, rehab nurse, PT, OT, and SLP to address physical and functional deficits in the context of the above medical diagnosis(es)? Yes Addressing deficits in the following areas: balance, endurance, locomotion, strength, transferring, bowel/bladder control, bathing, dressing, feeding, grooming, toileting, cognition, speech, language, and swallowing Can the patient actively participate in an intensive therapy program of at least 3 hrs of therapy 5 days a week? Yes The potential for patient to make measurable gains while on inpatient rehab is good Anticipated functional outcomes upon discharge from inpatient rehab: supervision PT, supervision OT, supervision SLP Estimated rehab length of stay to reach the above functional goals is: 10-12 days Anticipated discharge destination: Home 10. Overall Rehab/Functional Prognosis: good     MD Signature:           Revision History                           Note Details  Jan Fireman, MD File Time 12/25/2021 10:44 AM  Author Type Physician Status Signed  Last Editor [name],  [place] # [id number] Admit Date 12/25/2021

## 2021-12-25 NOTE — H&P (Signed)
Physical Medicine and Rehabilitation Admission H&P        Chief Complaint  Patient presents with   Stroke with functional deficits.       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  CT head was positive for right MCA toyed cytotoxic edema with hyperdense right MCA and CTA was positive for emergent LVO R-MCA mid M1 segment with pseudonormalization of right MCA infarct.  Also noted to have 64% R-ICA and 72% L-SA stenosis, LUL indolent lung cancer that was stable compared with CT 07/30/2021 note of emphysema.  MRI brain showed moderate right MCA infarct with trace petechial hemorrhage right caudate chronic hemosiderin with encephalomalacia posterior lateral left superior frontal gyrus (new since last year) she was started on hypertonic saline and ASA added for stroke due to intracranial large vessel atherosclerosis and uncontrolled risk factors. Per records, patient had tested positive for COVID on Sunday 09/17 and husband was sick prior to patient and no quarantine needed.   CT cervical spine done for work-up and showed healed old left C1 ring and left C6 and C7 fractures.  MRI brain done with contrast due to concerns of metastasis and showed no pathological enhancement.  2D echo showed EF 50 to 55% mild LVH.  Follow-up CT head 09/25 showed stable MCA infarct with evolving edema and mild mass effect.  Hypertonic saline was weaned off.  Speech therapy following for diet tolerance and this was slowly advanced to dysphagia 3, nectar liquids.    Dr. Leonie Man felt that she would likely need DAPT with aggressive risk factor  modification at discharge and has discussed this with husband. PT/OT has been working with patient who continues to be limited by left sided weakness, balance deficits, SOB with ambulation, anxiety and impulsivity. CIR recommended due to functional decline.    Per her sister, she's taken scheduled 1x/day inhaler at bedside ~ 3x feeling SOB, not realizing inappropriate. She also notes she's confused and pocketing on L side.  Pt even says "I don't comprehend everything that people say".  Also "I Have to talk my L hand into moving".  LBM this AM.  "Always had STM deficits"- is chronic.      Review of Systems  Constitutional:  Negative for chills and fever.  HENT:  Negative for hearing loss.   Eyes:        Decreased vision since stroke  Respiratory:  Positive for cough. Negative for shortness of breath.   Cardiovascular:  Negative for chest pain and palpitations.  Gastrointestinal:  Positive for heartburn. Negative for constipation.  Genitourinary:  Negative for dysuria.  Musculoskeletal:  Positive for myalgias.  Skin:  Negative for rash.  Neurological:  Positive for tremors, speech change and weakness. Negative for dizziness.  Psychiatric/Behavioral:  The patient is nervous/anxious.   All other systems reviewed and are negative.           Past Medical History:  Diagnosis Date   Anxiety     Bipolar 1 disorder ([place])     Bipolar disorder ([place])     Breast cancer ([place])     Breast cancer (  [place])     Bronchitis     CAD (coronary artery disease)     DDD (degenerative disc disease), cervical     Diabetes mellitus without complication ([place])     Emphysema lung ([facility])     Hypertension     ICH (intracerebral hemorrhage) ([facility])     Lung cancer ([facility])     Occasional tremors     Parotid mass     PUD (peptic ulcer disease)             Past Surgical History:  Procedure Laterality Date   BREAST LUMPECTOMY       GASTRIC BYPASS   2016   KNEE ARTHROSCOPY       REPAIR OF PERFORATED ULCER    07/2021             Family History  Problem Relation Age of Onset   Diabetes Mother     Tremor Sister     Diabetes Brother          Social History: Married. Independent PTA. Has 2 children. Smokes cigarettes--2 PPD since 1962. Sge           Allergies  Allergen Reactions   Latex Rash   Prozac [Fluoxetine] Rash            Medications Prior to Admission  Medication Sig Dispense Refill   acetaminophen (TYLENOL) 325 MG tablet Take 650 mg by mouth 3 (three) times daily as needed for mild pain.       amphetamine-dextroamphetamine (ADDERALL) 20 MG tablet Take 20 mg by mouth every morning.       atorvastatin (LIPITOR) 40 MG tablet Take 40 mg by mouth daily.       busPIRone (BUSPAR) 30 MG tablet Take 30 mg by mouth 3 (three) times daily.       CALCIUM PO Take 1 tablet by mouth daily.       HYDROcodone-acetaminophen (NORCO/VICODIN) 5-325 MG tablet Take 1 tablet by mouth 4 (four) times daily as needed for pain.       hydrOXYzine (ATARAX) 25 MG tablet Take 25 mg by mouth daily.       propranolol ER (INDERAL LA) 60 MG 24 hr capsule Take 60 mg by mouth daily.       QUEtiapine (SEROQUEL) 100 MG tablet Take 200 mg by mouth at bedtime.       traZODone (DESYREL) 50 MG tablet Take 50 mg by mouth at bedtime.              Home: Home Living Family/patient expects to be discharged to:: Private residence Living Arrangements: Spouse/significant other Available Help at Discharge: Available 24 hours/day Type of Home: House Home Access: Stairs to enter [employer] of Steps: 1+1 carport Entrance Stairs-Rails: None Home Layout: Two level Alternate Level Stairs-Number of Steps: flight. Full bath in Basement. Bath on main level is being renovated Alternate Level Stairs-Rails: Right Bathroom Shower/Tub: Multimedia programmer: Handicapped height Bathroom Accessibility: Yes Home Equipment: Conservation officer, nature (2 wheels), [employer] - quad, Grab bars - toilet, Tub bench  Lives With:  Spouse   Functional History: Prior Function Prior Level of Function : Independent/Modified Independent Mobility Comments: quad cane as needed, single fall since d/c in May ADLs Comments: mod I   Functional Status:  Mobility: Bed Mobility Overal bed mobility: Needs Assistance Bed Mobility: Supine to Sit Supine to sit: Min assist General bed mobility comments: in recliner Transfers Overall transfer level: Needs assistance Equipment used: Rolling walker (2 wheels)  Transfers: Sit to/from Stand Sit to Stand: Mod assist General transfer comment: initially falling back on chair due to not attending to L and only pulling up with walker on R, redirected upon further trials for L attention to get hand on arm of the chair and to push from the chair with min A only needed after cues Ambulation/Gait Ambulation/Gait assistance: Mod assist Gait Distance (Feet): 45 Feet (&16') Assistive device: Rolling walker (2 wheels) Gait Pattern/deviations: Step-to pattern, Step-through pattern, Decreased stride length, Decreased dorsiflexion - left, Shuffle, Drifts right/left General Gait Details: assist with keeping walker moving forward as veers to L, cues for L foot clearance with less dragging with attention to task.  Walked in hallway then rested, then around bed to recliner chair after sink activity Gait velocity: decreased Gait velocity interpretation: <1.31 ft/sec, indicative of household ambulator   ADL: ADL Overall ADL's : Needs assistance/impaired Eating/Feeding: Minimal assistance, Sitting Grooming: Minimal assistance, Wash/dry hands, Standing Upper Body Bathing: Minimal assistance, Sitting Lower Body Bathing: Moderate assistance, Sit to/from stand Upper Body Dressing : Minimal assistance, Sitting Upper Body Dressing Details (indicate cue type and reason): donning new gown, attending to L UE Lower Body Dressing: Moderate assistance, Sit to/from stand Lower Body Dressing Details (indicate cue  type and reason): assist for L sock, min assist in standing Toilet Transfer: Minimal assistance, Ambulation, Rolling walker (2 wheels), Grab bars Toileting- Clothing Manipulation and Hygiene: Moderate assistance, Sit to/from stand Toileting - Clothing Manipulation Details (indicate cue type and reason): assist to manage briefs off (soiled), min assist to complete hygiene Functional mobility during ADLs: Minimal assistance, Rolling walker (2 wheels), Cueing for sequencing, Cueing for safety General ADL Comments: remains limited by L sided weakness, impaired cognition   Cognition: Cognition Overall Cognitive Status: Impaired/Different from baseline Arousal/Alertness: Awake/alert Orientation Level: Oriented X4 Attention: Selective Selective Attention: Impaired Selective Attention Impairment: Verbal basic Memory: Impaired Memory Impairment: Retrieval deficit Awareness: Impaired Awareness Impairment: Intellectual impairment Safety/Judgment: Impaired Cognition Arousal/Alertness: Awake/alert Behavior During Therapy: Impulsive, Anxious Overall Cognitive Status: Impaired/Different from baseline Area of Impairment: Attention, Following commands, Safety/judgement, Awareness, Problem solving, Memory Current Attention Level: Focused Memory: Decreased short-term memory Following Commands: Follows one step commands consistently, Follows one step commands with increased time Safety/Judgement: Decreased awareness of safety, Decreased awareness of deficits Awareness: Emergent Problem Solving: Difficulty sequencing, Requires verbal cues, Requires tactile cues General Comments: difficulty with attention with multiple family in the room and concerned about her phone, her inhaler and needed frequent redirection to task for L attention with mobility tasks     Blood pressure 125/78, pulse 89, temperature 98 F (36.7 C), temperature source Oral, resp. rate 16, height 5' (1.524 m), weight 44 kg, SpO2 93  %. Physical Exam Vitals and nursing note reviewed. Exam conducted with a chaperone present.  Constitutional:      General: She is not in acute distress.    Appearance: She is cachectic. She is ill-appearing.     Comments: Anxious, restless, distracted and had hard time attending to conversation. Was drinking unthickened water provided by husband.  When I saw her, drinking thickened liquids per sister; nurse gave meds via applesauce; tangential; appears gaunt, cachetic/frail, sitting up in bed- ate <25% of meal, NAD  HENT:     Head: Normocephalic.     Comments: L facial droop- pocketing food a little on L;  L tongue deviation Almost absent sensation to light touch on L side of face    Right Ear: External ear normal.  Left Ear: External ear normal.     Nose: Nose normal. No congestion.     Mouth/Throat:     Mouth: Mucous membranes are dry.     Pharynx: Oropharynx is clear. No oropharyngeal exudate.  Eyes:     General:        Right eye: No discharge.        Left eye: No discharge.     Extraocular Movements: Extraocular movements intact.     Comments: No nystagmus Visual field deficit on L  Cardiovascular:     Rate and Rhythm: Normal rate and regular rhythm.     Heart sounds: Normal heart sounds. No murmur heard.    No gallop.  Pulmonary:     Effort: Pulmonary effort is normal. No respiratory distress.     Breath sounds: Normal breath sounds. Decreased air movement present. No wheezing, rhonchi or rales.     Comments: Bouts of coughing with exam.  Decreased at bases; no coughing with eating/applesauce or nectar thick liquids Abdominal:     General: Abdomen is flat. Bowel sounds are normal. There is no distension.     Palpations: Abdomen is soft.     Tenderness: There is no abdominal tenderness.  Musculoskeletal:     Cervical back: Neck supple. No tenderness.     Comments: RUE 5/5 RLE- HF 4+/5; KE 5-/5; DF and PF 5/5 LUE- Biceps 4/5; Triceps 4/5; WE 4+/5; Grip 4+/5; and FA  3+/5, however due to sensory deficits, appeared to need a lot of cues to participat ein exam LLE- HF 4/5; KE 4+/5; DF/PF 4+/5   Skin:    General: Skin is warm and dry.     Comments: A lot of bruising in Ue's Stage I on coccyx  Neurological:     Mental Status: She is alert.     Comments: Left facial weakness with mild to moderate dysarthria. Left inattention with Left sided weakness.  Tangential and labile at times, distracted and needed frequent redirection.  Intentional tremors BUE.   L inattention  Decreased to light touch on LUE/LLE absent to LT on L face Tangential Confusion vs some receptive aphasia?        Psychiatric:     Comments: Somewhat labile-         Lab Results Last 48 Hours        Results for orders placed or performed during the hospital encounter of 12/20/21 (from the past 48 hour(s))  CBC     Status: None    Collection Time: 12/24/21  7:26 AM  Result Value Ref Range    WBC 9.0 4.0 - 10.5 K/uL    RBC 4.63 3.87 - 5.11 MIL/uL    Hemoglobin 13.2 12.0 - 15.0 g/dL    HCT 40.8 36.0 - 46.0 %    MCV 88.1 80.0 - 100.0 fL    MCH 28.5 26.0 - 34.0 pg    MCHC 32.4 30.0 - 36.0 g/dL    RDW 13.6 11.5 - 15.5 %    Platelets 338 150 - 400 K/uL    nRBC 0.0 0.0 - 0.2 %      Comment: Performed at [facility], [place] [address] [place],  [address]  Basic metabolic panel     Status: Abnormal    Collection Time: 12/24/21  7:26 AM  Result Value Ref Range    Sodium 139 135 - 145 mmol/L    Potassium 3.7 3.5 - 5.1 mmol/L    Chloride 105 98 -  111 mmol/L    CO2 27 22 - 32 mmol/L    Glucose, Bld 95 70 - 99 mg/dL      Comment: Glucose reference range applies only to samples taken after fasting for at least 8 hours.    BUN 14 8 - 23 mg/dL    Creatinine, Ser 0.44 0.44 - 1.00 mg/dL    Calcium 8.7 (L) 8.9 - 10.3 mg/dL    GFR, Estimated >60 >60 mL/min      Comment: (NOTE) Calculated using the CKD-EPI Creatinine Equation (2021)      Anion gap 7 5 - 15      Comment:  Performed at [facility] [address] [place] [address]  CBC     Status: Abnormal    Collection Time: 12/25/21  5:51 AM  Result Value Ref Range    WBC 10.8 (H) 4.0 - 10.5 K/uL    RBC 4.62 3.87 - 5.11 MIL/uL    Hemoglobin 13.2 12.0 - 15.0 g/dL    HCT 40.1 36.0 - 46.0 %    MCV 86.8 80.0 - 100.0 fL    MCH 28.6 26.0 - 34.0 pg    MCHC 32.9 30.0 - 36.0 g/dL    RDW 13.5 11.5 - 15.5 %    Platelets 385 150 - 400 K/uL    nRBC 0.0 0.0 - 0.2 %      Comment: Performed at [facility], [place] [address], [place], [place] [address]  Basic metabolic panel     Status: Abnormal    Collection Time: 12/25/21  5:51 AM  Result Value Ref Range    Sodium 140 135 - 145 mmol/L    Potassium 3.9 3.5 - 5.1 mmol/L    Chloride 105 98 - 111 mmol/L    CO2 26 22 - 32 mmol/L    Glucose, Bld 103 (H) 70 - 99 mg/dL      Comment: Glucose reference range applies only to samples taken after fasting for at least 8 hours.    BUN 11 8 - 23 mg/dL    Creatinine, Ser 0.43 (L) 0.44 - 1.00 mg/dL    Calcium 8.9 8.9 - 10.3 mg/dL    GFR, Estimated >60 >60 mL/min      Comment: (NOTE) Calculated using the CKD-EPI Creatinine Equation (2021)      Anion gap 9 5 - 15      Comment: Performed at [facility] [address], [place], [place] [address]       Imaging Results (Last 48 hours)  DG Swallowing Func-Speech Pathology   Result Date: 12/24/2021 Table formatting from the original result was not included. Objective Swallowing Evaluation: Type of Study: MBS-Modified Barium Swallow Study  Patient Details Name: Lauren [name] MRN: [id number] Date of Birth: 1950/04/11 Today's Date: 12/24/2021 Time: SLP Start Time (ACUTE ONLY): 0820 -SLP Stop Time (ACUTE ONLY): 0835 SLP Time Calculation (min) (ACUTE ONLY): 15 min Past Medical History: Past Medical History: Diagnosis Date  Diabetes mellitus without complication ([place])   Hypertension  Past Surgical History: No past surgical history on file. HPI: Patient is a 71  y.o. female with PMH: CAD, HTN, DM-2, COPD, GERD, lung nodules, right parotid mass,  tobacco dependency, ADD, stomach ulcers, tremor disorder, prior hemorrhagic stroke, HLD, thrombocytosis. She presented to the hospital on 12/19/21 with left sided weakness, slurred speech and left sided facial droop. Imaging showed moderately sized right MCA CVA including dense involvement of the right basal ganglia, insula and  anterior operculum. She was out of window for intervention; some developing midline shift. She failed Yale swallow with RN and SLP ordered for swallow evaluation.  Subjective: pleasant, alert, a little fidgety/anxious  Recommendations for follow up therapy are one component of a multi-disciplinary discharge planning process, led by the attending physician.  Recommendations may be updated based on patient status, additional functional criteria and insurance authorization. Assessment / Plan / Recommendation   12/24/2021   9:46 AM Clinical Impressions Clinical Impression Patient presents with a mild oropharyngeal and a mild-moderate esophageal dysphagia as per this modified barium swallow study. During oral phase, she exhibited decreased bolus cohesion with liquids and delays in mastication and anterior to posterior transit of puree and soft solid textures. During pharyngeal phase, swallow was initiated at level of vallecular sinus with puree and soft solids as well as with teaspoon sip of thin liquid. Swallow was initiated at level of pyriform sinus with thin and nectar thick liquids. Flash, trace penetration occured with thin liquids via cup sips and with nectar thick liquids. When taking larger sip of barium to try to aid in clearing soft solid barium stasis in esophagus (upper thoracic portion), patient did exhibit trace, sensed aspiration of nectar thick liquid barium during the swallow. SLP did observe that cervical esophagus appeared dilated and suspected osteophytes present. During sweep of esophagus,  stasis of soft solid barium in upper thoracic portion of esophagus observed and sip of nectar thick liquids did aid in transiting fully; also observed possible dysmotility and retrograde movement of barium in distal esophagus. SLP is recommending to continue with Dys 3 (mechanical soft) solids and nectar thick liquids but to allow patient to have thin liquids in between meals. SLP Visit Diagnosis Dysphagia, oropharyngeal phase (R13.12);Dysphagia, pharyngoesophageal phase (R13.14) Impact on safety and function Mild aspiration risk     12/24/2021   9:46 AM Treatment Recommendations Treatment Recommendations Therapy as outlined in treatment plan below     12/24/2021   9:58 AM Prognosis Prognosis for Safe Diet Advancement Good Barriers to Reach Goals Severity of deficits   12/24/2021   9:46 AM Diet Recommendations SLP Diet Recommendations Dysphagia 3 (Mech soft) solids;Nectar thick liquid Liquid Administration via Cup;Straw Medication Administration Whole meds with liquid Compensations Minimize environmental distractions;Lingual sweep for clearance of pocketing Postural Changes Seated upright at 90 degrees;Remain semi-upright after after feeds/meals (Comment)     12/24/2021   9:46 AM Other Recommendations Oral Care Recommendations Oral care BID Follow Up Recommendations Acute inpatient rehab (3hours/day) Assistance recommended at discharge Frequent or constant Supervision/Assistance Functional Status Assessment Patient has had a recent decline in their functional status and demonstrates the ability to make significant improvements in function in a reasonable and predictable amount of time.   12/24/2021   9:46 AM Frequency and Duration  Speech Therapy Frequency (ACUTE ONLY) min 2x/week Treatment Duration 2 weeks     12/24/2021   9:37 AM Oral Phase Oral Phase Impaired Oral - Nectar Cup Weak lingual manipulation;Reduced posterior propulsion;Piecemeal swallowing Oral - Nectar Straw Weak lingual manipulation;Reduced posterior  propulsion;Piecemeal swallowing Oral - Thin Teaspoon Weak lingual manipulation;Reduced posterior propulsion Oral - Thin Cup Reduced posterior propulsion;Decreased bolus cohesion;Weak lingual manipulation Oral - Puree Reduced posterior propulsion;Weak lingual manipulation Oral - Mech Soft Weak lingual manipulation;Reduced posterior propulsion;Impaired mastication Oral - Pill Reduced posterior propulsion;Weak lingual manipulation    12/24/2021   9:40 AM Pharyngeal Phase Pharyngeal Phase Impaired Pharyngeal- Nectar Cup Delayed swallow initiation-pyriform sinuses Pharyngeal- Nectar Straw Delayed swallow initiation-pyriform sinuses;Trace aspiration;Penetration/Aspiration during swallow;Reduced  airway/laryngeal closure Pharyngeal Material enters airway, passes BELOW cords and not ejected out despite cough attempt by patient;Material enters airway, remains ABOVE vocal cords then ejected out Pharyngeal- Thin Teaspoon Delayed swallow initiation-vallecula Pharyngeal- Thin Cup Delayed swallow initiation-pyriform sinuses;Penetration/Aspiration during swallow Pharyngeal- Puree Reduced pharyngeal peristalsis Pharyngeal- Pill Reduced pharyngeal peristalsis    12/24/2021   9:44 AM Cervical Esophageal Phase  Cervical Esophageal Phase Impaired Cervical Esophageal Comment cervical esophagus appeared dilated and with suspected cervical osteophytes (no radiologist present to confirm) Sonia Baller, MA, CCC-SLP Speech Therapy                             Blood pressure 125/78, pulse 89, temperature 98 F (36.7 C), temperature source Oral, resp. rate 16, height 5' (1.524 m), weight 44 kg, SpO2 93 %.   Medical Problem List and Plan: 1. Functional deficits secondary to R MCA stroke with L sensory deficits and L hemiparesis as well as dysphagia and L inattention             -patient may  shower             -ELOS/Goals: 10-14 days- supervision maybe min A due to sensory deficits 2.  Antithrombotics: -DVT/anticoagulation:   Pharmaceutical: Lovenox             -antiplatelet therapy: ASA. Will contact Neuro re: addition of Plavix.  3. DDD Cervical spine/Pain Management: tylenol prn.  4. Mood/Behavior/Sleep:  LCSW to follow for evaluation and support.              -antipsychotic agents:  Continue  5. Neuropsych/cognition: This patient may be intermittently capable of making decisions on her own behalf. 6. Skin/Wound Care: Routine pressure relief measures.  7. Fluids/Electrolytes/Nutrition: Monitor I/O. Check CMET in am. 8. HTN: Monitor BP TID 9. CAD: Monitor for symptoms with increase in activity. Continue ASA, Lipitor 10. 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  --Stable on Breztri PTA and husband provided from home.  11. Chronic tremors: Managed with inderal and Lamictal--worse in am and gets better as day progresses.  12. Bipolar disorder: Stable on home regimen of Lamictal, Seroquel and buspar for anxiety 13. ADHD: On Adderall daily.   14.  Perforated gastric ulcer s/p repair 07/2021: On PPI 15. OSA: Nocturnal oxygen in the  past. Will order prn.  16. Dysphagia/Cough: on D2 nectar thick liquids. Educated patient and husband on aspiration risk and need for nectar liquids.              --flutter valve added.             --check CXR.      I have personally performed a face to face diagnostic evaluation of this patient and formulated the key components of the plan.  Additionally, I have personally reviewed laboratory data, imaging studies, as well as relevant notes and concur with the physician assistant's documentation above.   The patient's status has not changed from the original H&P.  Any changes in documentation from the acute care chart have been noted above.                 Bary Leriche, PA-C 12/25/2021

## 2021-12-25 NOTE — Progress Notes (Signed)
Inpatient Rehabilitation Admissions Coordinator   I have Cir bed that I can admit pt to today. Acute team, TOC and Dr Doristine Bosworth made aware. Await medical clearance from acute MD to make the arrangements to admit today.  Danne Baxter, RN, MSN Rehab Admissions Coordinator [phone] 12/25/2021 10:05 AM

## 2021-12-25 NOTE — H&P (Signed)
Physical Medicine and Rehabilitation Admission H&P    Chief Complaint  Patient presents with   Stroke with functional deficits.     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  CT head was positive for right MCA toyed cytotoxic edema with hyperdense right MCA and CTA was positive for emergent LVO R-MCA mid M1 segment with pseudonormalization of right MCA infarct.  Also noted to have 64% R-ICA and 72% L-SA stenosis, LUL indolent lung cancer that was stable compared with CT 07/30/2021 note of emphysema.  MRI brain showed moderate right MCA infarct with trace petechial hemorrhage right caudate chronic hemosiderin with encephalomalacia posterior lateral left superior frontal gyrus (new since last year) she was started on hypertonic saline and ASA added for stroke due to intracranial large vessel atherosclerosis and uncontrolled risk factors. Per records, patient had tested positive for COVID on Sunday 09/17 and husband was sick prior to patient and no quarantine needed.  CT cervical spine done for work-up and showed healed old left C1 ring and left C6 and C7 fractures.  MRI brain done with contrast due to concerns of metastasis and showed no pathological enhancement.  2D echo showed EF 50 to 55% mild LVH.  Follow-up CT head 09/25 showed stable MCA infarct with evolving edema and mild mass effect.  Hypertonic saline was weaned off.  Speech therapy following for diet tolerance and this was slowly advanced to dysphagia 3, nectar liquids.   Dr. Leonie Man felt that she would likely need DAPT with aggressive risk factor modification at  discharge and has discussed this with husband. PT/OT has been working with patient who continues to be limited by left sided weakness, balance deficits, SOB with ambulation, anxiety and impulsivity. CIR recommended due to functional decline.   Per her sister, she's taken scheduled 1x/day inhaler at bedside ~ 3x feeling SOB, not realizing inappropriate. She also notes she's confused and pocketing on L side.  Pt even says "I [name]'t comprehend everything that people say".  Also "I Have to talk my L hand into moving".  LBM this AM.  "Always had STM deficits"- is chronic.    Review of Systems  Constitutional:  Negative for chills and fever.  HENT:  Negative for hearing loss.   Eyes:        Decreased vision since stroke  Respiratory:  Positive for cough. Negative for shortness of breath.   Cardiovascular:  Negative for chest pain and palpitations.  Gastrointestinal:  Positive for heartburn. Negative for constipation.  Genitourinary:  Negative for dysuria.  Musculoskeletal:  Positive for myalgias.  Skin:  Negative for rash.  Neurological:  Positive for tremors, speech change and weakness. Negative for dizziness.  Psychiatric/Behavioral:  The patient is nervous/anxious.   All other systems reviewed and are negative.    Past Medical History:  Diagnosis Date   Anxiety    Bipolar 1 disorder ([place])    Bipolar disorder ([place])    Breast cancer ([place])    Breast cancer ([place])    Bronchitis    CAD (coronary artery disease)    DDD (degenerative disc disease), cervical    Diabetes  mellitus without complication ([place])    Emphysema lung ([facility])    Hypertension    ICH (intracerebral hemorrhage) ([facility])    Lung cancer ([facility])    Occasional tremors    Parotid mass    PUD (peptic ulcer disease)     Past Surgical History:  Procedure Laterality Date   BREAST LUMPECTOMY     GASTRIC BYPASS  2016   KNEE ARTHROSCOPY     REPAIR OF PERFORATED ULCER  07/2021     Family History  Problem Relation Age of  Onset   Diabetes Mother    Tremor Sister    Diabetes Brother      Social History: Married. Independent PTA. Has 2 children. Smokes cigarettes--2 PPD since 1962. Sge     Allergies  Allergen Reactions   Latex Rash   Prozac [Fluoxetine] Rash    Medications Prior to Admission  Medication Sig Dispense Refill   acetaminophen (TYLENOL) 325 MG tablet Take 650 mg by mouth 3 (three) times daily as needed for mild pain.     amphetamine-dextroamphetamine (ADDERALL) 20 MG tablet Take 20 mg by mouth every morning.     atorvastatin (LIPITOR) 40 MG tablet Take 40 mg by mouth daily.     busPIRone (BUSPAR) 30 MG tablet Take 30 mg by mouth 3 (three) times daily.     CALCIUM PO Take 1 tablet by mouth daily.     HYDROcodone-acetaminophen (NORCO/VICODIN) 5-325 MG tablet Take 1 tablet by mouth 4 (four) times daily as needed for pain.     hydrOXYzine (ATARAX) 25 MG tablet Take 25 mg by mouth daily.     propranolol ER (INDERAL LA) 60 MG 24 hr capsule Take 60 mg by mouth daily.     QUEtiapine (SEROQUEL) 100 MG tablet Take 200 mg by mouth at bedtime.     traZODone (DESYREL) 50 MG tablet Take 50 mg by mouth at bedtime.        Home: Home Living Family/patient expects to be discharged to:: Private residence Living Arrangements: Spouse/significant other Available Help at Discharge: Available 24 hours/day Type of Home: House Home Access: Stairs to enter [employer] of Steps: 1+1 carport Entrance Stairs-Rails: None Home Layout: Two level Alternate Level Stairs-Number of Steps: flight. Full bath in Basement. Bath on main level is being renovated Alternate Level Stairs-Rails: Right Bathroom Shower/Tub: Multimedia programmer: Handicapped height Bathroom Accessibility: Yes Home Equipment: Conservation officer, nature (2 wheels), [employer] - quad, Grab bars - toilet, Tub bench  Lives With: Spouse   Functional History: Prior Function Prior Level of Function : Independent/Modified Independent Mobility  Comments: quad cane as needed, single fall since d/c in May ADLs Comments: mod I  Functional Status:  Mobility: Bed Mobility Overal bed mobility: Needs Assistance Bed Mobility: Supine to Sit Supine to sit: Min assist General bed mobility comments: in recliner Transfers Overall transfer level: Needs assistance Equipment used: Rolling walker (2 wheels) Transfers: Sit to/from Stand Sit to Stand: Mod assist General transfer comment: initially falling back on chair due to not attending to L and only pulling up with walker on R, redirected upon further trials for L attention to get hand on arm of the chair and to push from the chair with min A only needed after cues Ambulation/Gait Ambulation/Gait assistance: Mod assist Gait Distance (Feet): 45 Feet (&16') Assistive device: Rolling walker (2 wheels) Gait Pattern/deviations: Step-to pattern, Step-through pattern, Decreased stride length, Decreased dorsiflexion - left, Shuffle, Drifts right/left General Gait Details: assist with keeping walker moving forward as  veers to L, cues for L foot clearance with less dragging with attention to task.  Walked in hallway then rested, then around bed to recliner chair after sink activity Gait velocity: decreased Gait velocity interpretation: <1.31 ft/sec, indicative of household ambulator    ADL: ADL Overall ADL's : Needs assistance/impaired Eating/Feeding: Minimal assistance, Sitting Grooming: Minimal assistance, Wash/dry hands, Standing Upper Body Bathing: Minimal assistance, Sitting Lower Body Bathing: Moderate assistance, Sit to/from stand Upper Body Dressing : Minimal assistance, Sitting Upper Body Dressing Details (indicate cue type and reason): donning new gown, attending to L UE Lower Body Dressing: Moderate assistance, Sit to/from stand Lower Body Dressing Details (indicate cue type and reason): assist for L sock, min assist in standing Toilet Transfer: Minimal assistance, Ambulation,  Rolling walker (2 wheels), Grab bars Toileting- Clothing Manipulation and Hygiene: Moderate assistance, Sit to/from stand Toileting - Clothing Manipulation Details (indicate cue type and reason): assist to manage briefs off (soiled), min assist to complete hygiene Functional mobility during ADLs: Minimal assistance, Rolling walker (2 wheels), Cueing for sequencing, Cueing for safety General ADL Comments: remains limited by L sided weakness, impaired cognition  Cognition: Cognition Overall Cognitive Status: Impaired/Different from baseline Arousal/Alertness: Awake/alert Orientation Level: Oriented X4 Attention: Selective Selective Attention: Impaired Selective Attention Impairment: Verbal basic Memory: Impaired Memory Impairment: Retrieval deficit Awareness: Impaired Awareness Impairment: Intellectual impairment Safety/Judgment: Impaired Cognition Arousal/Alertness: Awake/alert Behavior During Therapy: Impulsive, Anxious Overall Cognitive Status: Impaired/Different from baseline Area of Impairment: Attention, Following commands, Safety/judgement, Awareness, Problem solving, Memory Current Attention Level: Focused Memory: Decreased short-term memory Following Commands: Follows one step commands consistently, Follows one step commands with increased time Safety/Judgement: Decreased awareness of safety, Decreased awareness of deficits Awareness: Emergent Problem Solving: Difficulty sequencing, Requires verbal cues, Requires tactile cues General Comments: difficulty with attention with multiple family in the room and concerned about her phone, her inhaler and needed frequent redirection to task for L attention with mobility tasks   Blood pressure 125/78, pulse 89, temperature 98 F (36.7 C), temperature source Oral, resp. rate 16, height 5' (1.524 m), weight 44 kg, SpO2 93 %. Physical Exam Vitals and nursing note reviewed. Exam conducted with a chaperone present.  Constitutional:       General: She is not in acute distress.    Appearance: She is cachectic. She is ill-appearing.     Comments: Anxious, restless, distracted and had hard time attending to conversation. Was drinking unthickened water provided by husband.  When I saw her, drinking thickened liquids per sister; nurse gave meds via applesauce; tangential; appears gaunt, cachetic/frail, sitting up in bed- ate <25% of meal, NAD  HENT:     Head: Normocephalic.     Comments: L facial droop- pocketing food a little on L;  L tongue deviation Almost absent sensation to light touch on L side of face    Right Ear: External ear normal.     Left Ear: External ear normal.     Nose: Nose normal. No congestion.     Mouth/Throat:     Mouth: Mucous membranes are dry.     Pharynx: Oropharynx is clear. No oropharyngeal exudate.  Eyes:     General:        Right eye: No discharge.        Left eye: No discharge.     Extraocular Movements: Extraocular movements intact.     Comments: No nystagmus Visual field deficit on L  Cardiovascular:     Rate and Rhythm: Normal rate and regular rhythm.  Heart sounds: Normal heart sounds. No murmur heard.    No gallop.  Pulmonary:     Effort: Pulmonary effort is normal. No respiratory distress.     Breath sounds: Normal breath sounds. Decreased air movement present. No wheezing, rhonchi or rales.     Comments: Bouts of coughing with exam.  Decreased at bases; no coughing with eating/applesauce or nectar thick liquids Abdominal:     General: Abdomen is flat. Bowel sounds are normal. There is no distension.     Palpations: Abdomen is soft.     Tenderness: There is no abdominal tenderness.  Musculoskeletal:     Cervical back: Neck supple. No tenderness.     Comments: RUE 5/5 RLE- HF 4+/5; KE 5-/5; DF and PF 5/5 LUE- Biceps 4/5; Triceps 4/5; WE 4+/5; Grip 4+/5; and FA 3+/5, however due to sensory deficits, appeared to need a lot of cues to participat ein exam LLE- HF 4/5; KE 4+/5;  DF/PF 4+/5   Skin:    General: Skin is warm and dry.     Comments: A lot of bruising in Ue's Stage I on coccyx  Neurological:     Mental Status: She is alert.     Comments: Left facial weakness with mild to moderate dysarthria. Left inattention with Left sided weakness.  Tangential and labile at times, distracted and needed frequent redirection.  Intentional tremors BUE.  L inattention  Decreased to light touch on LUE/LLE absent to LT on L face Tangential Confusion vs some receptive aphasia?        Psychiatric:     Comments: Somewhat labile-      Results for orders placed or performed during the hospital encounter of 12/20/21 (from the past 48 hour(s))  CBC     Status: None   Collection Time: 12/24/21  7:26 AM  Result Value Ref Range   WBC 9.0 4.0 - 10.5 K/uL   RBC 4.63 3.87 - 5.11 MIL/uL   Hemoglobin 13.2 12.0 - 15.0 g/dL   HCT 40.8 36.0 - 46.0 %   MCV 88.1 80.0 - 100.0 fL   MCH 28.5 26.0 - 34.0 pg   MCHC 32.4 30.0 - 36.0 g/dL   RDW 13.6 11.5 - 15.5 %   Platelets 338 150 - 400 K/uL   nRBC 0.0 0.0 - 0.2 %    Comment: Performed at [facility], [place] [address] [place], [place] [address]  Basic metabolic panel     Status: Abnormal   Collection Time: 12/24/21  7:26 AM  Result Value Ref Range   Sodium 139 135 - 145 mmol/L   Potassium 3.7 3.5 - 5.1 mmol/L   Chloride 105 98 - 111 mmol/L   CO2 27 22 - 32 mmol/L   Glucose, Bld 95 70 - 99 mg/dL    Comment: Glucose reference range applies only to samples taken after fasting for at least 8 hours.   BUN 14 8 - 23 mg/dL   Creatinine, Ser 0.44 0.44 - 1.00 mg/dL   Calcium 8.7 (L) 8.9 - 10.3 mg/dL   GFR, Estimated >60 >60 mL/min    Comment: (NOTE) Calculated using the CKD-EPI Creatinine Equation (2021)    Anion gap 7 5 - 15    Comment: Performed at [facility] [address], [place], [place] [address]  CBC     Status: Abnormal   Collection Time: 12/25/21  5:51 AM  Result Value Ref Range   WBC 10.8 (H) 4.0 - 10.5  K/uL  RBC 4.62 3.87 - 5.11 MIL/uL   Hemoglobin 13.2 12.0 - 15.0 g/dL   HCT 40.1 36.0 - 46.0 %   MCV 86.8 80.0 - 100.0 fL   MCH 28.6 26.0 - 34.0 pg   MCHC 32.9 30.0 - 36.0 g/dL   RDW 13.5 11.5 - 15.5 %   Platelets 385 150 - 400 K/uL   nRBC 0.0 0.0 - 0.2 %    Comment: Performed at [facility], [place] [address], [place], [place] [address]  Basic metabolic panel     Status: Abnormal   Collection Time: 12/25/21  5:51 AM  Result Value Ref Range   Sodium 140 135 - 145 mmol/L   Potassium 3.9 3.5 - 5.1 mmol/L   Chloride 105 98 - 111 mmol/L   CO2 26 22 - 32 mmol/L   Glucose, Bld 103 (H) 70 - 99 mg/dL    Comment: Glucose reference range applies only to samples taken after fasting for at least 8 hours.   BUN 11 8 - 23 mg/dL   Creatinine, Ser 0.43 (L) 0.44 - 1.00 mg/dL   Calcium 8.9 8.9 - 10.3 mg/dL   GFR, Estimated >60 >60 mL/min    Comment: (NOTE) Calculated using the CKD-EPI Creatinine Equation (2021)    Anion gap 9 5 - 15    Comment: Performed at [facility] [address] [place], [place] [address]   DG Swallowing Func-Speech Pathology  Result Date: 12/24/2021 Table formatting from the original result was not included. Objective Swallowing Evaluation: Type of Study: MBS-Modified Barium Swallow Study  Patient Details Name: Kyelle Urbas MRN: [id number] Date of Birth: [birth year] Today's Date: 12/24/2021 Time: SLP Start Time (ACUTE ONLY): 0820 -SLP Stop Time (ACUTE ONLY): 0835 SLP Time Calculation (min) (ACUTE ONLY): 15 min Past Medical History: Past Medical History: Diagnosis Date  Diabetes mellitus without complication ([place])   Hypertension  Past Surgical History: No past surgical history on file. HPI: Patient is a 71 y.o. female with PMH: CAD, HTN, DM-2, COPD, GERD, lung nodules, right parotid mass,  tobacco dependency, ADD, stomach ulcers, tremor disorder, prior hemorrhagic stroke, HLD, thrombocytosis. She presented to the hospital on 12/19/21 with left sided weakness, slurred  speech and left sided facial droop. Imaging showed moderately sized right MCA CVA including dense involvement of the right basal ganglia, insula and anterior operculum. She was out of window for intervention; some developing midline shift. She failed Yale swallow with RN and SLP ordered for swallow evaluation.  Subjective: pleasant, alert, a little fidgety/anxious  Recommendations for follow up therapy are one component of a multi-disciplinary discharge planning process, led by the attending physician.  Recommendations may be updated based on patient status, additional functional criteria and insurance authorization. Assessment / Plan / Recommendation   12/24/2021   9:46 AM Clinical Impressions Clinical Impression Patient presents with a mild oropharyngeal and a mild-moderate esophageal dysphagia as per this modified barium swallow study. During oral phase, she exhibited decreased bolus cohesion with liquids and delays in mastication and anterior to posterior transit of puree and soft solid textures. During pharyngeal phase, swallow was initiated at level of vallecular sinus with puree and soft solids as well as with teaspoon sip of thin liquid. Swallow was initiated at level of pyriform sinus with thin and nectar thick liquids. Flash, trace penetration occured with thin liquids via cup sips and with nectar thick liquids. When taking larger sip of barium to try to aid in clearing soft solid barium stasis in esophagus (  upper thoracic portion), patient did exhibit trace, sensed aspiration of nectar thick liquid barium during the swallow. SLP did observe that cervical esophagus appeared dilated and suspected osteophytes present. During sweep of esophagus, stasis of soft solid barium in upper thoracic portion of esophagus observed and sip of nectar thick liquids did aid in transiting fully; also observed possible dysmotility and retrograde movement of barium in distal esophagus. SLP is recommending to continue with Dys 3  (mechanical soft) solids and nectar thick liquids but to allow patient to have thin liquids in between meals. SLP Visit Diagnosis Dysphagia, oropharyngeal phase (R13.12);Dysphagia, pharyngoesophageal phase (R13.14) Impact on safety and function Mild aspiration risk     12/24/2021   9:46 AM Treatment Recommendations Treatment Recommendations Therapy as outlined in treatment plan below     12/24/2021   9:58 AM Prognosis Prognosis for Safe Diet Advancement Good Barriers to Reach Goals Severity of deficits   12/24/2021   9:46 AM Diet Recommendations SLP Diet Recommendations Dysphagia 3 (Mech soft) solids;Nectar thick liquid Liquid Administration via Cup;Straw Medication Administration Whole meds with liquid Compensations Minimize environmental distractions;Lingual sweep for clearance of pocketing Postural Changes Seated upright at 90 degrees;Remain semi-upright after after feeds/meals (Comment)     12/24/2021   9:46 AM Other Recommendations Oral Care Recommendations Oral care BID Follow Up Recommendations Acute inpatient rehab (3hours/day) Assistance recommended at discharge Frequent or constant Supervision/Assistance Functional Status Assessment Patient has had a recent decline in their functional status and demonstrates the ability to make significant improvements in function in a reasonable and predictable amount of time.   12/24/2021   9:46 AM Frequency and Duration  Speech Therapy Frequency (ACUTE ONLY) min 2x/week Treatment Duration 2 weeks     12/24/2021   9:37 AM Oral Phase Oral Phase Impaired Oral - Nectar Cup Weak lingual manipulation;Reduced posterior propulsion;Piecemeal swallowing Oral - Nectar Straw Weak lingual manipulation;Reduced posterior propulsion;Piecemeal swallowing Oral - Thin Teaspoon Weak lingual manipulation;Reduced posterior propulsion Oral - Thin Cup Reduced posterior propulsion;Decreased bolus cohesion;Weak lingual manipulation Oral - Puree Reduced posterior propulsion;Weak lingual manipulation  Oral - Mech Soft Weak lingual manipulation;Reduced posterior propulsion;Impaired mastication Oral - Pill Reduced posterior propulsion;Weak lingual manipulation    12/24/2021   9:40 AM Pharyngeal Phase Pharyngeal Phase Impaired Pharyngeal- Nectar Cup Delayed swallow initiation-pyriform sinuses Pharyngeal- Nectar Straw Delayed swallow initiation-pyriform sinuses;Trace aspiration;Penetration/Aspiration during swallow;Reduced airway/laryngeal closure Pharyngeal Material enters airway, passes BELOW cords and not ejected out despite cough attempt by patient;Material enters airway, remains ABOVE vocal cords then ejected out Pharyngeal- Thin Teaspoon Delayed swallow initiation-vallecula Pharyngeal- Thin Cup Delayed swallow initiation-pyriform sinuses;Penetration/Aspiration during swallow Pharyngeal- Puree Reduced pharyngeal peristalsis Pharyngeal- Pill Reduced pharyngeal peristalsis    12/24/2021   9:44 AM Cervical Esophageal Phase  Cervical Esophageal Phase Impaired Cervical Esophageal Comment cervical esophagus appeared dilated and with suspected cervical osteophytes (no radiologist present to confirm) Sonia Baller, MA, CCC-SLP Speech Therapy                        Blood pressure 125/78, pulse 89, temperature 98 F (36.7 C), temperature source Oral, resp. rate 16, height 5' (1.524 m), weight 44 kg, SpO2 93 %.  Medical Problem List and Plan: 1. Functional deficits secondary to R MCA stroke with L sensory deficits and L hemiparesis as well as dysphagia and L inattention  -patient may  shower  -ELOS/Goals: 10-14 days- supervision maybe min A due to sensory deficits 2.  Antithrombotics: -DVT/anticoagulation:  Pharmaceutical: Lovenox  -antiplatelet therapy: ASA. Will  contact Neuro re: addition of Plavix.  3. DDD Cervical spine/Pain Management: tylenol prn.  4. Mood/Behavior/Sleep:  LCSW to follow for evaluation and support.   -antipsychotic agents:  Continue  5. Neuropsych/cognition: This patient may be  intermittently capable of making decisions on her own behalf. 6. Skin/Wound Care: Routine pressure relief measures.  7. Fluids/Electrolytes/Nutrition: Monitor I/O. Check CMET in am. 8. HTN: Monitor BP TID 9. CAD: Monitor for symptoms with increase in activity. Continue ASA, Lipitor 10. 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  --Stable on Breztri PTA and husband provided from home.  11. Chronic tremors: Managed with inderal and Lamictal--worse in am and gets better as day progresses.  12. Bipolar disorder: Stable on home regimen of Lamictal, Seroquel and buspar for anxiety 13. ADHD: On Adderall daily.   14.  Perforated gastric ulcer s/p repair 07/2021: On PPI 15. OSA: Nocturnal oxygen in the  past. Will order prn.  16. Dysphagia/Cough: on D2 nectar thick liquids. Educated patient and husband on aspiration risk and need for nectar liquids.   --flutter valve added.  --check CXR.    I have personally performed a face to face diagnostic evaluation of this patient and formulated the key components of the plan.  Additionally, I have personally reviewed laboratory data, imaging studies, as well as relevant notes and concur with the physician assistant's documentation above.   The patient's status has not changed from the original H&P.  Any changes in documentation from the acute care chart have been noted above.          Bary Leriche, PA-C 12/25/2021

## 2021-12-25 NOTE — TOC Transition Note (Signed)
Transition of Care [facility]) - CM/SW Discharge Note   Patient Details  Name: Lauren [name] MRN: [id number] Date of Birth: 1950-09-18  [facility] CM/SW Contact:  Pollie Friar, RN Phone Number: 12/25/2021, 10:37 AM   Clinical Narrative:    Pt is discharging to CIR today. CM signing off.    Final next level of care: IP Rehab Facility Barriers to Discharge: No Barriers Identified   Patient Goals and CMS Choice   CMS Medicare.gov Compare Post Acute Care list provided to:: Patient Choice offered to / list presented to : Patient  Discharge Placement                       Discharge Plan and Services                                     Social Determinants of Health (SDOH) Interventions     Readmission Risk Interventions     No data to display

## 2021-12-25 NOTE — Discharge Summary (Signed)
Physician Discharge Summary  Celina Shiley QMG:[id number] DOB: [birth year] DOA: 12/20/2021  PCP: Malachy Moan, MD  Admit date: 12/20/2021 Discharge date: 12/25/2021 30 Day Unplanned Readmission Risk Score    Flowsheet Row ED to Hosp-Admission (Discharged) from 12/20/2021 in [facility]  30 Day Unplanned Readmission Risk Score (%) 12.52 Filed at 12/25/2021 0801       This score is the patient's risk of an unplanned readmission within 30 days of being discharged (0 -100%). The score is based on dignosis, age, lab data, medications, orders, and past utilization.   Low:  0-14.9   Medium: 15-21.9   High: 22-29.9   Extreme: 30 and above        Admitted From: Home  Disposition:  CIR  Recommendations for Outpatient Follow-up:  Follow up with PCP in 1-2 weeks Please obtain BMP/CBC in one week Follow up with neurology in 3-4 weeks Please follow up with your PCP on the following pending results: Unresulted Labs (From admission, onward)    None       Home Health:none Equipment/Devices:none   Discharge Condition:stable  CODE STATUS:full code  Diet recommendation: cardiac  Subjective:seen and examined, no new complaint.   Brief/Interim Summary: 71 year old woman with hx of CAD, HTN, DM2, tremor disorder, prior hemorrhagic stroke, HLD, thrombocytosis who presents after waking up falling out of bed with left sided weakness, slurred speech, and left facial droop.  Last seen normal last night ~8PM.  Unfortunately imaging shows completed R M1 MCA stroke and she is out of window for intervention.  Does have some developing midline shift so PCCM consulted for admission.  Eventually transferred under [facility].   Acute R MCA stroke with evidence of brain compression/edema, recent h/o hemorrhagic stroke 5/23- -unfortunately outside window for any intervention  -Neurology followed and signed off.  Follow-up with neurology in 2 weeks. MBS completed on 12/24/2021, recommendations for  dysphagia 3 diet.  She has very minimal weakness on the left upper extremity and left lower extremity.  Speech is clear but she is fidgety.  Likely anxious at her baseline.  She has been started on only aspirin 81 mg p.o. daily instead of DAPT due to previous history of large ICH/hemorrhagic stroke.  PT OT recommends CIR. discussed with Dr. Leonie Man, he recommended adding Plavix to aspirin at the time of discharge and continue this for 3 weeks and then stop aspirin and continue Plavix.  Patient is being discharged to CIR in stable condition today.   COVID-19 -Initial testing 12/14/2021, has completed quarantine given her asymptomatic outpatient testing only requires 5 days of quarantine - she does not need further contact precautions -No further indication for repeat testing, imaging or intervention   Chronic tremor-  -Continue propranolol   H/o perforated gastric ulcer  Continue home PPI   COPD without acute exacerbation Tobacco dependence, ongoing On Breztri PTA, follws with Dr. Alvin Critchley Continue bronchodilators currently on Brovana and Yupelri while in the hospital   H/o CAD, HTN and HLD  with prior PCI Continue to follow clinically -continue aspirin 81mg    Prior lung ca R side s/p SBRT , prior breast cancer  - s/p lumpectomy 2011   H/o Bipolar 1 and ADHD Continue Seroquel, BuSpar, and Lamictal  Discharge plan was discussed with patient and/or family member and they verbalized understanding and agreed with it.  Discharge Diagnoses:  Principal Problem:   Acute ischemic stroke [facility])    Discharge Instructions   Allergies as of 12/25/2021  Reactions   Latex Rash   Prozac [fluoxetine] Rash        Medication List     STOP taking these medications    Breztri Aerosphere 160-9-4.8 MCG/ACT Aero Generic drug: Budeson-Glycopyrrol-Formoterol   lamoTRIgine 100 MG tablet Commonly known as: LAMICTAL   multivitamin tablet       TAKE these medications     acetaminophen 325 MG tablet Commonly known as: TYLENOL Take 650 mg by mouth 3 (three) times daily as needed for mild pain.   amphetamine-dextroamphetamine 20 MG tablet Commonly known as: ADDERALL Take 20 mg by mouth every morning.   aspirin EC 81 MG tablet Take 1 tablet (81 mg total) by mouth daily for 21 days. Swallow whole.   atorvastatin 40 MG tablet Commonly known as: LIPITOR Take 40 mg by mouth daily.   busPIRone 30 MG tablet Commonly known as: BUSPAR Take 30 mg by mouth 3 (three) times daily.   CALCIUM PO Take 1 tablet by mouth daily.   HYDROcodone-acetaminophen 5-325 MG tablet Commonly known as: NORCO/VICODIN Take 1 tablet by mouth 4 (four) times daily as needed for pain.   hydrOXYzine 25 MG tablet Commonly known as: ATARAX Take 25 mg by mouth daily.   propranolol ER 60 MG 24 hr capsule Commonly known as: INDERAL LA Take 60 mg by mouth daily.   QUEtiapine 100 MG tablet Commonly known as: SEROQUEL Take 200 mg by mouth at bedtime.   traZODone 50 MG tablet Commonly known as: DESYREL Take 50 mg by mouth at bedtime.        Follow-up Information     Malachy Moan, MD Follow up in 1 week(s).   Specialty: Family Medicine Contact information: [address] [place] [address] [phone]                Allergies  Allergen Reactions   Latex Rash   Prozac [Fluoxetine] Rash    Consultations: Neurology   Procedures/Studies: DG Swallowing Func-Speech Pathology  Result Date: 12/24/2021 Table formatting from the original result was not included. Objective Swallowing Evaluation: Type of Study: MBS-Modified Barium Swallow Study  Patient Details Name: Reyana Leisey MRN: [id number] Date of Birth: July 18, 1950 Today's Date: 12/24/2021 Time: SLP Start Time (ACUTE ONLY): 0820 -SLP Stop Time (ACUTE ONLY): 0835 SLP Time Calculation (min) (ACUTE ONLY): 15 min Past Medical History: Past Medical History: Diagnosis Date  Diabetes mellitus  without complication ([place])   Hypertension  Past Surgical History: No past surgical history on file. HPI: Patient is a 71 y.o. female with PMH: CAD, HTN, DM-2, COPD, GERD, lung nodules, right parotid mass,  tobacco dependency, ADD, stomach ulcers, tremor disorder, prior hemorrhagic stroke, HLD, thrombocytosis. She presented to the hospital on 12/19/21 with left sided weakness, slurred speech and left sided facial droop. Imaging showed moderately sized right MCA CVA including dense involvement of the right basal ganglia, insula and anterior operculum. She was out of window for intervention; some developing midline shift. She failed Yale swallow with RN and SLP ordered for swallow evaluation.  Subjective: pleasant, alert, a little fidgety/anxious  Recommendations for follow up therapy are one component of a multi-disciplinary discharge planning process, led by the attending physician.  Recommendations may be updated based on patient status, additional functional criteria and insurance authorization. Assessment / Plan / Recommendation   12/24/2021   9:46 AM Clinical Impressions Clinical Impression Patient presents with a mild oropharyngeal and a mild-moderate esophageal dysphagia as per this modified barium swallow study. During oral phase, she  exhibited decreased bolus cohesion with liquids and delays in mastication and anterior to posterior transit of puree and soft solid textures. During pharyngeal phase, swallow was initiated at level of vallecular sinus with puree and soft solids as well as with teaspoon sip of thin liquid. Swallow was initiated at level of pyriform sinus with thin and nectar thick liquids. Flash, trace penetration occured with thin liquids via cup sips and with nectar thick liquids. When taking larger sip of barium to try to aid in clearing soft solid barium stasis in esophagus (upper thoracic portion), patient did exhibit trace, sensed aspiration of nectar thick liquid barium during the swallow.  SLP did observe that cervical esophagus appeared dilated and suspected osteophytes present. During sweep of esophagus, stasis of soft solid barium in upper thoracic portion of esophagus observed and sip of nectar thick liquids did aid in transiting fully; also observed possible dysmotility and retrograde movement of barium in distal esophagus. SLP is recommending to continue with Dys 3 (mechanical soft) solids and nectar thick liquids but to allow patient to have thin liquids in between meals. SLP Visit Diagnosis Dysphagia, oropharyngeal phase (R13.12);Dysphagia, pharyngoesophageal phase (R13.14) Impact on safety and function Mild aspiration risk     12/24/2021   9:46 AM Treatment Recommendations Treatment Recommendations Therapy as outlined in treatment plan below     12/24/2021   9:58 AM Prognosis Prognosis for Safe Diet Advancement Good Barriers to Reach Goals Severity of deficits   12/24/2021   9:46 AM Diet Recommendations SLP Diet Recommendations Dysphagia 3 (Mech soft) solids;Nectar thick liquid Liquid Administration via Cup;Straw Medication Administration Whole meds with liquid Compensations Minimize environmental distractions;Lingual sweep for clearance of pocketing Postural Changes Seated upright at 90 degrees;Remain semi-upright after after feeds/meals (Comment)     12/24/2021   9:46 AM Other Recommendations Oral Care Recommendations Oral care BID Follow Up Recommendations Acute inpatient rehab (3hours/day) Assistance recommended at discharge Frequent or constant Supervision/Assistance Functional Status Assessment Patient has had a recent decline in their functional status and demonstrates the ability to make significant improvements in function in a reasonable and predictable amount of time.   12/24/2021   9:46 AM Frequency and Duration  Speech Therapy Frequency (ACUTE ONLY) min 2x/week Treatment Duration 2 weeks     12/24/2021   9:37 AM Oral Phase Oral Phase Impaired Oral - Nectar Cup Weak lingual  manipulation;Reduced posterior propulsion;Piecemeal swallowing Oral - Nectar Straw Weak lingual manipulation;Reduced posterior propulsion;Piecemeal swallowing Oral - Thin Teaspoon Weak lingual manipulation;Reduced posterior propulsion Oral - Thin Cup Reduced posterior propulsion;Decreased bolus cohesion;Weak lingual manipulation Oral - Puree Reduced posterior propulsion;Weak lingual manipulation Oral - Mech Soft Weak lingual manipulation;Reduced posterior propulsion;Impaired mastication Oral - Pill Reduced posterior propulsion;Weak lingual manipulation    12/24/2021   9:40 AM Pharyngeal Phase Pharyngeal Phase Impaired Pharyngeal- Nectar Cup Delayed swallow initiation-pyriform sinuses Pharyngeal- Nectar Straw Delayed swallow initiation-pyriform sinuses;Trace aspiration;Penetration/Aspiration during swallow;Reduced airway/laryngeal closure Pharyngeal Material enters airway, passes BELOW cords and not ejected out despite cough attempt by patient;Material enters airway, remains ABOVE vocal cords then ejected out Pharyngeal- Thin Teaspoon Delayed swallow initiation-vallecula Pharyngeal- Thin Cup Delayed swallow initiation-pyriform sinuses;Penetration/Aspiration during swallow Pharyngeal- Puree Reduced pharyngeal peristalsis Pharyngeal- Pill Reduced pharyngeal peristalsis    12/24/2021   9:44 AM Cervical Esophageal Phase  Cervical Esophageal Phase Impaired Cervical Esophageal Comment cervical esophagus appeared dilated and with suspected cervical osteophytes (no radiologist present to confirm) Sonia Baller, MA, CCC-SLP Speech Therapy  CT HEAD WO CONTRAST (5MM)  Result Date: 12/22/2021 CLINICAL DATA:  71 year old female code stroke presentation with right MCA infarct visible on presentation CT, distal M1 LV0 with reconstitution. EXAM: CT HEAD WITHOUT CONTRAST TECHNIQUE: Contiguous axial images were obtained from the base of the skull through the vertex without intravenous contrast. RADIATION DOSE  REDUCTION: This exam was performed according to the departmental dose-optimization program which includes automated exposure control, adjustment of the mA and/or kV according to patient size and/or use of iterative reconstruction technique. COMPARISON:  Brain MRI both without and with contrast yesterday. Head CT 12/20/2021 and earlier. FINDINGS: Brain: Mild evolution of cytotoxic edema on CT since 12/20/2021. No hemorrhagic transformation. Compared to the presentation CT, mild mass effect has developed in the right hemisphere including trace leftward midline shift and partially effaced right lateral ventricle. But the extent of cytotoxic edema has not significantly changed. Left hemisphere and posterior fossa gray-white differentiation maintained. Basilar cisterns remain normal. Vascular: Calcified atherosclerosis at the skull base. Skull: No acute osseous abnormality identified. Sinuses/Orbits: Visualized paranasal sinuses and mastoids are stable and well aerated. Other: Mild rightward gaze.  Stable scalp soft tissues. IMPRESSION: 1. Right MCA infarct with stable extent but evolved edema and mild mass effect since presentation. Trace leftward midline shift. No malignant hemorrhagic transformation. 2. No new intracranial abnormality. Electronically Signed   By: Genevie Ann M.D.   On: 12/22/2021 05:30   DG Chest Port 1 View  Result Date: 12/21/2021 CLINICAL DATA:  Stroke. EXAM: PORTABLE CHEST 1 VIEW COMPARISON:  CT scan of the chest Jul 30, 2021. Chest x-ray Aug 07, 2021. FINDINGS: A left upper lobe nodule similar in the interval. The spiculated nodule centrally in the right lung is not well assessed with chest x-ray imaging. Hyperinflation of the lungs. Blunting of the costophrenic angles may represent tiny effusions. No overt edema. Possible nipple shadow over the right base. The cardiomediastinal silhouette is stable. No pneumothorax. IMPRESSION: 1. Stable left upper lobe nodule. 2. The central spiculated nodule in  the right mid lung on previous CT imaging is not well assessed on this study. 3. Possible tiny pleural effusions, particularly on the right. 4. Confluence of shadows versus nipple shadow over the right base. Recommend attention to this region on follow-up or repeat imaging with nipple markers. 5. Hyperinflation of the lungs. Electronically Signed   By: Dorise Bullion III M.D.   On: 12/21/2021 16:47   MR BRAIN W CONTRAST  Result Date: 12/21/2021 CLINICAL DATA:  Acute right MCA infarct. Order states brain metastases suspected. Personal history of lung and breast cancer. EXAM: MRI HEAD WITH CONTRAST TECHNIQUE: Multiplanar, multiecho pulse sequences of the brain and surrounding structures were obtained with intravenous contrast. CONTRAST:  4.4 mL Vueway COMPARISON:  MR head without contrast 12/21/2021 FINDINGS: Brain: The study is mildly degraded by patient motion. No pathologic enhancement is present. Cortical thickening and edema is present in the anterior right frontal lobe and operculum consistent with the known infarct. T2 signal changes are present within the right caudate head and lentiform nucleus. IMPRESSION: 1. No pathologic enhancement to suggest metastatic disease to the brain. 2. Cortical thickening and edema in the anterior right frontal lobe and operculum consistent with the known infarct. Electronically Signed   By: San Morelle M.D.   On: 12/21/2021 15:46   MR BRAIN WO CONTRAST  Result Date: 12/21/2021 CLINICAL DATA:  71 year old female code stroke presentation with right MCA infarct visible on presentation CT, distal M1 LV0 with reconstitution. EXAM: MRI  HEAD WITHOUT CONTRAST TECHNIQUE: Multiplanar, multiecho pulse sequences of the brain and surrounding structures were obtained without intravenous contrast. COMPARISON:  CT head, CTA head and neck, CT Perfusion yesterday. Brain MRI 03/20/2021. FINDINGS: Brain: Confluent restricted diffusion in the right MCA territory anterior and middle  divisions including dense involvement of the right basal ganglia, insula and anterior operculum. Associated cytotoxic edema with T2 and FLAIR hyperintensity, gyral swelling. The pattern is similar to the T-max > 4s perfusion appearance yesterday. Trace petechial hemorrhage in the superior right caudate (Heidelberg Classification 1A). No other hemorrhagic transformation. Mild mass effect on the right lateral ventricle with only trace leftward midline shift. Basilar cisterns remain patent. No contralateral left hemisphere or posterior fossa abnormal diffusion. But a small area of encephalomalacia in the left superior frontal gyrus which is new from last year is associated with dense new hemosiderin on SWI series 15, image 46. No edema or mass effect there. No ventriculomegaly, extra-axial collection. Cervicomedullary junction and pituitary are within normal limits. Vascular: Major intracranial vascular flow voids appear stable compared to last year. Skull and upper cervical spine: Negative. Visualized bone marrow signal is within normal limits. Sinuses/Orbits: Stable and negative. Other: Mastoids remain clear. Visible scalp and face appear negative. IMPRESSION: 1. Confluent moderate sized right MCA infarct. Trace petechial hemorrhage in the right caudate. No other hemorrhagic transformation. No significant intracranial mass effect. 2. Chronic confluent hemosiderin with encephalomalacia in the posterolateral left superior frontal gyrus is new from the MRI last year. Electronically Signed   By: Genevie Ann M.D.   On: 12/21/2021 05:30   CT HEAD WO CONTRAST (5MM)  Result Date: 12/20/2021 CLINICAL DATA:  Stroke follow-up EXAM: CT HEAD WITHOUT CONTRAST TECHNIQUE: Contiguous axial images were obtained from the base of the skull through the vertex without intravenous contrast. RADIATION DOSE REDUCTION: This exam was performed according to the departmental dose-optimization program which includes automated exposure control,  adjustment of the mA and/or kV according to patient size and/or use of iterative reconstruction technique. COMPARISON:  CT head 12/20/2021 at 8:04 a.m. FINDINGS: Brain: Similar cytotoxic edema in the right MCA territory affecting the right insula, lentiform nuclei, and internal capsule. No hemorrhagic transformation or midline shift. No evidence of new infarct. No hydrocephalus. The basal cisterns are patent. Vascular: Calcified atherosclerosis in the skull base. The previously hyperdense right M1 MCA appears less dense today. Skull: Normal. Negative for fracture or focal lesion. Sinuses/Orbits: No acute finding. Other: None. IMPRESSION: Similar right MCA territory infarct compared with CT head earlier today. No hemorrhagic transformation or midline shift. Electronically Signed   By: Placido Sou M.D.   On: 12/20/2021 21:52   ECHOCARDIOGRAM COMPLETE  Result Date: 12/20/2021    ECHOCARDIOGRAM REPORT   Patient Name:   MEIRA WAHBA Date of Exam: 12/20/2021 Medical Rec #:  [id number]      Height:       60.0 in Accession #:    [id number]     Weight:       97.0 lb Date of Birth:  26-Jul-1950     BSA:          1.372 m Patient Age:    83 years       BP:           134/88 mmHg Patient Gender: F              HR:           98 bpm. Exam Location:  Inpatient Procedure: 2D Echo, Cardiac Doppler  and Color Doppler Indications:    Stroke I63.9  History:        Patient has no prior history of Echocardiogram examinations.                 Risk Factors:Diabetes and Hypertension.  Sonographer:    Bernadene Person RDCS Referring Phys: [id number] Candee Furbish  Sonographer Comments: Suboptimal parasternal window. Image acquisition challenging due to patient body habitus. IMPRESSIONS  1. Left ventricular ejection fraction, by estimation, is 50 to 55%. The left ventricle has low normal function. The left ventricle has no regional wall motion abnormalities. There is mild left ventricular hypertrophy. Left ventricular diastolic parameters  are consistent with Grade I diastolic dysfunction (impaired relaxation).  2. Right ventricular systolic function is normal. The right ventricular size is normal. There is normal pulmonary artery systolic pressure. The estimated right ventricular systolic pressure is 41.7 mmHg.  3. The mitral valve is normal in structure. No evidence of mitral valve regurgitation. No evidence of mitral stenosis.  4. The aortic valve was not well visualized. Aortic valve regurgitation is not visualized. No aortic stenosis is present.  5. The inferior vena cava is normal in size with greater than 50% respiratory variability, suggesting right atrial pressure of 3 mmHg. FINDINGS  Left Ventricle: Left ventricular ejection fraction, by estimation, is 50 to 55%. The left ventricle has low normal function. The left ventricle has no regional wall motion abnormalities. The left ventricular internal cavity size was normal in size. There is mild left ventricular hypertrophy. Left ventricular diastolic parameters are consistent with Grade I diastolic dysfunction (impaired relaxation). Right Ventricle: The right ventricular size is normal. No increase in right ventricular wall thickness. Right ventricular systolic function is normal. There is normal pulmonary artery systolic pressure. The tricuspid regurgitant velocity is 2.48 m/s, and  with an assumed right atrial pressure of 3 mmHg, the estimated right ventricular systolic pressure is 40.8 mmHg. Left Atrium: Left atrial size was normal in size. Right Atrium: Right atrial size was normal in size. Pericardium: Trivial pericardial effusion is present. Mitral Valve: The mitral valve is normal in structure. No evidence of mitral valve regurgitation. No evidence of mitral valve stenosis. Tricuspid Valve: The tricuspid valve is normal in structure. Tricuspid valve regurgitation is trivial. Aortic Valve: The aortic valve was not well visualized. Aortic valve regurgitation is not visualized. No aortic  stenosis is present. Pulmonic Valve: The pulmonic valve was not well visualized. Pulmonic valve regurgitation is not visualized. Aorta: The aortic root and ascending aorta are structurally normal, with no evidence of dilitation. Venous: The inferior vena cava is normal in size with greater than 50% respiratory variability, suggesting right atrial pressure of 3 mmHg. IAS/Shunts: The interatrial septum was not well visualized.  LEFT VENTRICLE PLAX 2D LVIDd:         4.40 cm     Diastology LVIDs:         2.80 cm     LV e' medial:    6.12 cm/s LV PW:         0.90 cm     LV E/e' medial:  6.9 LV IVS:        0.70 cm     LV e' lateral:   8.51 cm/s LVOT diam:     1.90 cm     LV E/e' lateral: 5.0 LV SV:         51 LV SV Index:   37 LVOT Area:     2.84 cm  LV  Volumes (MOD) LV vol d, MOD A2C: 87.5 ml LV vol d, MOD A4C: 69.0 ml LV vol s, MOD A2C: 45.4 ml LV vol s, MOD A4C: 33.6 ml LV SV MOD A2C:     42.1 ml LV SV MOD A4C:     69.0 ml LV SV MOD BP:      40.8 ml RIGHT VENTRICLE RV S prime:     10.30 cm/s TAPSE (M-mode): 1.7 cm LEFT ATRIUM             Index        RIGHT ATRIUM          Index LA diam:        2.00 cm 1.46 cm/m   RA Area:     9.66 cm LA Vol (A2C):   28.3 ml 20.62 ml/m  RA Volume:   17.10 ml 12.46 ml/m LA Vol (A4C):   18.3 ml 13.33 ml/m LA Biplane Vol: 24.2 ml 17.63 ml/m  AORTIC VALVE LVOT Vmax:   98.50 cm/s LVOT Vmean:  66.500 cm/s LVOT VTI:    0.179 m  AORTA Ao Root diam: 3.10 cm Ao Asc diam:  3.20 cm MITRAL VALVE               TRICUSPID VALVE MV Area (PHT): 3.48 cm    TR Peak grad:   24.6 mmHg MV Decel Time: 218 msec    TR Vmax:        248.00 cm/s MV E velocity: 42.30 cm/s MV A velocity: 60.70 cm/s  SHUNTS MV E/A ratio:  0.70        Systemic VTI:  0.18 m                            Systemic Diam: 1.90 cm Oswaldo Milian MD Electronically signed by Oswaldo Milian MD Signature Date/Time: 12/20/2021/4:05:58 PM    Final    CT Cervical Spine Wo Contrast  Result Date: 12/20/2021 CLINICAL DATA:   71 year old female code stroke presentation. EXAM: CT CERVICAL SPINE WITH CONTRAST TECHNIQUE: Multiplanar CT images of the cervical spine were reconstructed from contemporary CTA of the Neck. RADIATION DOSE REDUCTION: This exam was performed according to the departmental dose-optimization program which includes automated exposure control, adjustment of the mA and/or kV according to patient size and/or use of iterative reconstruction technique. CONTRAST:  No additional COMPARISON:  CTA head and neck today reported separately. Cervical spine CT 11/17/2018. FINDINGS: Alignment: Chronic straightening of lower cervical lordosis. Cervicothoracic junction alignment is within normal limits. Bilateral posterior element alignment is within normal limits. Skull base and vertebrae: Visualized skull base is intact. No atlanto-occipital dissociation. Healed left anterior C1 ring fracture since 2020. Maintained C1-C2 alignment. Previously seen left C6 and C7 superior articulating facet fractures have healed (series 3, image 64). No acute osseous abnormality identified. Soft tissues and spinal canal: No prevertebral fluid or swelling. No visible canal hematoma. Neck CTA findings reported separately. Disc levels: Intermittent cervical disc and endplate degeneration appears stable since 2020. Upper chest: Reported separately. IMPRESSION: 1. No acute osseous abnormality identified in the cervical spine. Healed left C1 ring, left C6 and C7 posterior element fractures since 2020. 2. CTA head and neck today reported separately. Electronically Signed   By: Genevie Ann M.D.   On: 12/20/2021 08:56   CT ANGIO HEAD NECK W WO CM W PERF (CODE STROKE)  Result Date: 12/20/2021 CLINICAL DATA:  Code stroke. 71 year old female. Known indolent left upper  lobe lung carcinoma. EXAM: CT ANGIOGRAPHY HEAD AND NECK CT PERFUSION BRAIN TECHNIQUE: Multidetector CT imaging of the head and neck was performed using the standard protocol during bolus  administration of intravenous contrast. Multiplanar CT image reconstructions and MIPs were obtained to evaluate the vascular anatomy. Carotid stenosis measurements (when applicable) are obtained utilizing NASCET criteria, using the distal internal carotid diameter as the denominator. Multiphase CT imaging of the brain was performed following IV bolus contrast injection. Subsequent parametric perfusion maps were calculated using RAPID software. RADIATION DOSE REDUCTION: This exam was performed according to the departmental dose-optimization program which includes automated exposure control, adjustment of the mA and/or kV according to patient size and/or use of iterative reconstruction technique. CONTRAST:  24mL OMNIPAQUE IOHEXOL 350 MG/ML SOLN COMPARISON:  Plain head CT 0805 hours today Chest CT 07/30/2021. FINDINGS: CT Brain Perfusion Findings: ASPECTS: 5 CBF (<30%) Volume: 0 by the standard criteria, and no CBV alterations, suggesting subacute cytotoxic edema on the plain head CT. Perfusion (Tmax>6.0s) volume: 4mL by the standard criteria, up to 54 mL using T-max greater than 4 S. Mismatch Volume: Difficult to determine considering pseudonormalization of CBF and CBV. Suspect little salvageable penumbra given this constellation. Infarction Location:Right MCA CTA NECK Skeleton: Largely absent dentition. Mild mandible motion artifact. Mild cervical spine degeneration for age. No acute osseous abnormality identified. Upper chest: Emphysema and apical lung scarring. Known left upper lobe lung cancer is 12 mm on series 5, image 151, stable since 07/30/2021. Other neck: No acute finding. Aortic arch: Calcified aortic atherosclerosis. Entire arch not included. Right carotid system: Visible brachiocephalic artery with only mild plaque. Negative right CCA origin. Mild right CCA calcified plaque without stenosis. Right ICA origin calcified plaque with up to 64 % stenosis with respect to the distal vessel. Additional right  ICA bulb calcified plaque with lesser stenosis. Left carotid system: Left CCA origin not included. Mild left CCA plaque proximal to the bifurcation. Bulky calcified plaque at the left ICA origin. 50% stenosis at the left ICA origin. Less pronounced calcified plaque at the left ICA bulb. Vertebral arteries: Proximal right subclavian artery plaque without stenosis. Minimal plaque at the right vertebral artery origin without stenosis. Right vertebral artery is mildly dominant and patent to the skull base without stenosis. Bulky proximal left subclavian artery plaque with up to 72 % stenosis with respect to the distal vessel series 5, image 167. Plaque near the left vertebral artery origin but only mild origin stenosis. Left vertebral artery is mildly non dominant with additional V1 and V2 calcified plaque but remains patent to the skull base with no significant stenosis. CTA HEAD Posterior circulation: Mild bilateral distal vertebral artery calcified plaque without stenosis. Patent PICA origins and vertebrobasilar junction. Patent basilar artery without stenosis. Patent SCA and PCA origins. Posterior communicating arteries are diminutive or absent. Bilateral PCA branches are within normal limits. Anterior circulation: Both ICA siphons are patent with calcified plaque. Only mild bilateral siphon stenosis. Patent carotid termini, MCA and ACA origins. Anterior communicating artery and bilateral ACA branches are within normal limits. Left MCA M1 segment and bifurcation are patent without stenosis. Left MCA branches are within normal limits. Right MCA origin is patent. The right M1 is occluded on series 12, image 23 about 10 mm from its origin proximal to the bifurcation. However, the bifurcation and MCA branches are moderately reconstituted as seen on series 11, image 16. Venous sinuses: Grossly patent. Anatomic variants: Mildly dominant right vertebral artery. Review of the MIP images confirms the above  findings Salient  findings discussed by telephone with Dr. Kerney Elbe on 12/20/2021 at 0824 hours. IMPRESSION: 1. Positive for Emergent Large Vessel Occlusion of the Right MCA mid M1 segment, but pseudo-normalization of the Right MCA infarct on both CBF and CBV. Combined with ASPECTS of 5 and moderately reconstituted appearance of the right MCA branches this constellation does not seem favorable for endovascular reperfusion. These findings discussed by telephone with Dr. Kerney Elbe on 12/20/2021 at 0824 hours. 2. Known left upper lobe indolent lung cancer appears stable since chest CT 07/30/2021, 12 mm. Emphysema (ICD10-J43.9). 3. Aortic Atherosclerosis (ICD10-I70.0) and atherosclerosis in the neck and at the skull base. Significant stenoses: - Right ICA origin, 64%. - proximal Left Subclavian Artery, 72%. Electronically Signed   By: Genevie Ann M.D.   On: 12/20/2021 08:42   CT HEAD CODE STROKE WO CONTRAST  Result Date: 12/20/2021 CLINICAL DATA:  Code stroke.  71 year old female EXAM: CT HEAD WITHOUT CONTRAST TECHNIQUE: Contiguous axial images were obtained from the base of the skull through the vertex without intravenous contrast. RADIATION DOSE REDUCTION: This exam was performed according to the departmental dose-optimization program which includes automated exposure control, adjustment of the mA and/or kV according to patient size and/or use of iterative reconstruction technique. COMPARISON:  Brain MRI 03/20/2021. Head CT 09/17/2018. FINDINGS: Brain: Cytotoxic edema in the right MCA territory (series 2, image 22). No hemorrhagic transformation or midline shift. M1 and M2 segments affected. Right insula, lentiform and internal capsule affected. Elsewhere gray-white matter differentiation appears normal. Basilar cisterns are preserved. No ventriculomegaly. Vascular: Calcified atherosclerosis at the skull base. Hyperdense right MCA M1 on series 5, image 21. Skull: No acute osseous abnormality identified. Sinuses/Orbits: Visualized  paranasal sinuses and mastoids are stable and well aerated. Other: Rightward gaze. Visualized scalp soft tissues are within normal limits. ASPECTS [facility] Stroke Program Early CT Score) Total score (0-10 with 10 being normal): 5, as above IMPRESSION: 1. Positive for right MCA cytotoxic edema and hyperdense Right MCA compatible with ELVO. ASPECTS five. 2. No associated hemorrhage or midline shift. 3. These results were communicated to Dr. Cheral Marker at 8:22 am on 12/20/2021 by text page via the [facility] messaging system. Electronically Signed   By: Genevie Ann M.D.   On: 12/20/2021 08:23     Discharge Exam: Vitals:   12/25/21 0758 12/25/21 0853  BP: 125/78   Pulse: 89   Resp: 16   Temp: 98 F (36.7 C)   SpO2: 98% 93%   Vitals:   12/24/21 2330 12/25/21 0401 12/25/21 0758 12/25/21 0853  BP: 108/68 (!) 128/58 125/78   Pulse: 74 78 89   Resp: 18 19 16    Temp: 98 F (36.7 C) 98.7 F (37.1 C) 98 F (36.7 C)   TempSrc:  Oral Oral   SpO2: 93% 99% 98% 93%  Weight:      Height:        General: Pt is alert, awake, not in acute distress Cardiovascular: RRR, S1/S2 +, no rubs, no gallops Respiratory: CTA bilaterally, no wheezing, no rhonchi Abdominal: Soft, NT, ND, bowel sounds + Extremities: no edema, no cyanosis Neuro: Slight weakness in the left upper and lower extremity.  Right facial droop.   The results of significant diagnostics from this hospitalization (including imaging, microbiology, ancillary and laboratory) are listed below for reference.     Microbiology: Recent Results (from the past 240 hour(s))  MRSA Next Gen by PCR, Nasal     Status: None   Collection Time: 12/20/21 11:10  PM   Specimen: Nasal Mucosa; Nasal Swab  Result Value Ref Range Status   MRSA by PCR Next Gen NOT DETECTED NOT DETECTED Final    Comment: (NOTE) The GeneXpert MRSA Assay (FDA approved for NASAL specimens only), is one component of a comprehensive MRSA colonization surveillance program. It is not intended to  diagnose MRSA infection nor to guide or monitor treatment for MRSA infections. Test performance is not FDA approved in patients less than 34 years old. Performed at [facility], [place] [address] [place], [place] [address]      Labs: BNP (last 3 results) No results for input(s): "BNP" in the last 8760 hours. Basic Metabolic Panel: Recent Labs  Lab 12/21/21 0324 12/21/21 1032 12/21/21 2135 12/22/21 0313 12/23/21 0411 12/24/21 0726 12/25/21 0551  NA 145   < > 146* 145 141 139 140  K 3.4*  --   --  4.1 3.6 3.7 3.9  CL 111  --   --  115* 110 105 105  CO2 23  --   --  24 26 27 26   GLUCOSE 103*  --   --  113* 92 95 103*  BUN 6*  --   --  12 16 14 11   CREATININE 0.39*  --   --  0.43* 0.40* 0.44 0.43*  CALCIUM 8.4*  --   --  8.8* 8.5* 8.7* 8.9   < > = values in this interval not displayed.   Liver Function Tests: Recent Labs  Lab 12/20/21 0755  AST 26  ALT 23  ALKPHOS 84  BILITOT 0.8  PROT 6.2*  ALBUMIN 3.1*   No results for input(s): "LIPASE", "AMYLASE" in the last 168 hours. No results for input(s): "AMMONIA" in the last 168 hours. CBC: Recent Labs  Lab 12/20/21 0755 12/20/21 0757 12/21/21 0324 12/22/21 0313 12/23/21 0411 12/24/21 0726 12/25/21 0551  WBC 6.5  --  4.0 4.9 6.1 9.0 10.8*  NEUTROABS 4.2  --   --   --   --   --   --   HGB 14.6   < > 14.2 13.6 13.0 13.2 13.2  HCT 45.1   < > 42.5 43.2 41.7 40.8 40.1  MCV 89.8  --  87.8 90.4 90.3 88.1 86.8  PLT 258  --  293 276 236 338 385   < > = values in this interval not displayed.   Cardiac Enzymes: No results for input(s): "CKTOTAL", "CKMB", "CKMBINDEX", "TROPONINI" in the last 168 hours. BNP: Invalid input(s): "POCBNP" CBG: Recent Labs  Lab 12/20/21 0753 12/21/21 0905  GLUCAP 106* 97   D-Dimer No results for input(s): "DDIMER" in the last 72 hours. Hgb A1c No results for input(s): "HGBA1C" in the last 72 hours. Lipid Profile No results for input(s): "CHOL", "HDL", "LDLCALC", "TRIG",  "CHOLHDL", "LDLDIRECT" in the last 72 hours. Thyroid function studies No results for input(s): "TSH", "T4TOTAL", "T3FREE", "THYROIDAB" in the last 72 hours.  Invalid input(s): "FREET3" Anemia work up No results for input(s): "VITAMINB12", "FOLATE", "FERRITIN", "TIBC", "IRON", "RETICCTPCT" in the last 72 hours. Urinalysis    Component Value Date/Time   COLORURINE YELLOW 03/26/2010 [place] 03/26/2010 1139   LABSPEC 1.015 03/26/2010 1139   PHURINE 5.5 03/26/2010 1139   GLUCOSEU NEGATIVE 03/26/2010 1139   HGBUR TRACE (A) 03/26/2010 1139   BILIRUBINUR NEGATIVE 03/26/2010 1139   KETONESUR NEGATIVE 03/26/2010 1139   PROTEINUR NEGATIVE 03/26/2010 1139   UROBILINOGEN 0.2 03/26/2010 1139   NITRITE NEGATIVE 03/26/2010 1139  LEUKOCYTESUR SMALL (A) 03/26/2010 1139   Sepsis Labs Recent Labs  Lab 12/22/21 0313 12/23/21 0411 12/24/21 0726 12/25/21 0551  WBC 4.9 6.1 9.0 10.8*   Microbiology Recent Results (from the past 240 hour(s))  MRSA Next Gen by PCR, Nasal     Status: None   Collection Time: 12/20/21 11:10 PM   Specimen: Nasal Mucosa; Nasal Swab  Result Value Ref Range Status   MRSA by PCR Next Gen NOT DETECTED NOT DETECTED Final    Comment: (NOTE) The GeneXpert MRSA Assay (FDA approved for NASAL specimens only), is one component of a comprehensive MRSA colonization surveillance program. It is not intended to diagnose MRSA infection nor to guide or monitor treatment for MRSA infections. Test performance is not FDA approved in patients less than 7 years old. Performed at [facility], [place] [address], [place],  [address]      Time coordinating discharge: Over 30 minutes  SIGNED:   Darliss Cheney, MD  [facility] 12/25/2021, 12:10 PM *Please note that this is a verbal dictation therefore any spelling or grammatical errors are due to the "[place] [employer]" system interpretation. If 7PM-7AM, please contact  night-coverage [email or web address]

## 2021-12-25 NOTE — Progress Notes (Signed)
Physical Therapy Treatment Patient Details Name: Lauren [name] MRN: [id number] DOB: [birth year] Today's Date: 12/25/2021   History of Present Illness The pt is a 71 yo female presenting 9/22 with L-sided weakness, L facial droop, and R sided gaze. Imaging showed R MCA LVO, but outside window for intervention. 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    PT Comments    Patient progressing to hallway ambulation, though some SOB (wearing mask) and needing mod A for balance, walker management and cues for L foot clearance.  She was able to perform functional task with mod/max A at sink and standing balance and L NMR activity with mirror in the room.  Family present and supportive.  Feel she remains appropriate for acute inpatient rehab prior to d/c.   Recommendations for follow up therapy are one component of a multi-disciplinary discharge planning process, led by the attending physician.  Recommendations may be updated based on patient status, additional functional criteria and insurance authorization.  Follow Up Recommendations  Acute inpatient rehab (3hours/day)     Assistance Recommended at Discharge Frequent or constant Supervision/Assistance  Patient can return home with the following A lot of help with walking and/or transfers;A lot of help with bathing/dressing/bathroom;Assistance with cooking/housework;Direct supervision/assist for medications management;Direct supervision/assist for financial management;Assist for transportation;Help with stairs or ramp for entrance   Equipment Recommendations  None recommended by PT    Recommendations for Other Services       Precautions / Restrictions Precautions Precautions: Fall Precaution Comments: watch O2     Mobility  Bed Mobility                General bed mobility comments: in recliner    Transfers Overall transfer level: Needs assistance Equipment used: Rolling walker (2 wheels) Transfers: Sit to/from Stand Sit to Stand: Mod assist           General transfer comment: initially falling back on chair due to not attending to L and only pulling up with walker on R, redirected upon further trials for L attention to get hand on arm of the chair and to push from the chair with min A only needed after cues    Ambulation/Gait Ambulation/Gait assistance: Mod assist Gait Distance (Feet): 45 Feet (&16') Assistive device: Rolling walker (2 wheels) Gait Pattern/deviations: Step-to pattern, Step-through pattern, Decreased stride length, Decreased dorsiflexion - left, Shuffle, Drifts right/left       General Gait Details: assist with keeping walker moving forward as veers to L, cues for L foot clearance with less dragging with attention to task.  Walked in hallway then rested, then around bed to recliner chair after sink activity   Stairs             Wheelchair Mobility    Modified Rankin (Stroke Patients Only) Modified Rankin (Stroke Patients Only) Pre-Morbid Rankin Score: Slight disability Modified Rankin: Moderately severe disability     Balance Overall balance assessment: Needs assistance   Sitting balance-Leahy Scale: Fair     Standing balance support: Single extremity supported, Bilateral upper extremity supported Standing balance-Leahy Scale: Poor Standing balance comment: mod A for balance when unsupported by hand with initial posterior bias, balance at sink to wash perineal area with L hand on sink and min/mod A               High  Level Balance Comments: use of mirror for feedback for L foot tap up to roll of tape on the floor (3" roll).  Patient needing multimodal cues for task and initiated with R first for improved coordination to reach top of tape; mod A for balance with UE support on walker.             Cognition Arousal/Alertness: Awake/alert Behavior During Therapy: Impulsive, Anxious Overall Cognitive Status: Impaired/Different from baseline Area of Impairment: Attention, Following commands, Safety/judgement, Awareness, Problem solving, Memory                   Current Attention Level: Focused Memory: Decreased short-term memory Following Commands: Follows one step commands consistently, Follows one step commands with increased time Safety/Judgement: Decreased awareness of safety, Decreased awareness of deficits Awareness: Emergent Problem Solving: Difficulty sequencing, Requires verbal cues, Requires tactile cues General Comments: difficulty with attention with multiple family in the room and concerned about her phone, her inhaler and needed frequent redirection to task for L attention with mobility tasks        Exercises      General Comments General comments (skin integrity, edema, etc.): used rescue inhaler x 1 after ambulation, SpO2 93% after ambulation on RA, up to 97% after inhaler; family present and indicated she was using inhaler frequently; encouraged not to use again after ambulation around the bed to chair      Pertinent Vitals/Pain Pain Assessment Pain Assessment: Faces Faces Pain Scale: Hurts a little bit Pain Location: buttocks Pain Descriptors / Indicators: Discomfort, Sore Pain Intervention(s): Repositioned    Home Living                          Prior Function            PT Goals (current goals can now be found in the care plan section) Progress towards PT goals: Progressing toward goals    Frequency    Min 2X/week      PT Plan Current plan remains appropriate    Co-evaluation              AM-PAC PT "6 Clicks" Mobility   Outcome Measure  Help needed turning from your back to your side while in a flat bed without using bedrails?: A Little Help needed moving from lying on your back to sitting on the  side of a flat bed without using bedrails?: A Little Help needed moving to and from a bed to a chair (including a wheelchair)?: A Lot Help needed standing up from a chair using your arms (e.g., wheelchair or bedside chair)?: A Lot Help needed to walk in hospital room?: A Lot Help needed climbing 3-5 steps with a railing? : Total 6 Click Score: 13    End of Session Equipment Utilized During Treatment: Gait belt Activity Tolerance: Patient limited by fatigue Patient left: in chair;with call bell/phone within reach;with chair alarm set;with family/visitor present   PT Visit Diagnosis: Unsteadiness on feet (R26.81);Other abnormalities of gait and mobility (R26.89);Muscle weakness (generalized) (M62.81);Hemiplegia and hemiparesis Hemiplegia - Right/Left: Left Hemiplegia - dominant/non-dominant: Dominant Hemiplegia - caused by: Cerebral infarction     Time: 5366-4403 PT Time Calculation (min) (ACUTE ONLY): 33 min  Charges:  $Gait Training: 8-22 mins $Neuromuscular Re-education: 8-22 mins                     Magda Kiel, PT Acute Rehabilitation Services Office:[phone] 12/25/2021    Caren Griffins  Bernabe Dorce 12/25/2021, 10:59 AM

## 2021-12-26 ENCOUNTER — Inpatient Hospital Stay (HOSPITAL_COMMUNITY): Payer: Medicare Other

## 2021-12-26 DIAGNOSIS — I63511 Cerebral infarction due to unspecified occlusion or stenosis of right middle cerebral artery: Secondary | ICD-10-CM | POA: Diagnosis not present

## 2021-12-26 LAB — CBC WITH DIFFERENTIAL/PLATELET
Abs Immature Granulocytes: 0.05 10*3/uL (ref 0.00–0.07)
Basophils Absolute: 0 10*3/uL (ref 0.0–0.1)
Basophils Relative: 1 %
Eosinophils Absolute: 0.1 10*3/uL (ref 0.0–0.5)
Eosinophils Relative: 2 %
HCT: 42.8 % (ref 36.0–46.0)
Hemoglobin: 13.7 g/dL (ref 12.0–15.0)
Immature Granulocytes: 1 %
Lymphocytes Relative: 19 %
Lymphs Abs: 1.6 10*3/uL (ref 0.7–4.0)
MCH: 28.5 pg (ref 26.0–34.0)
MCHC: 32 g/dL (ref 30.0–36.0)
MCV: 89.2 fL (ref 80.0–100.0)
Monocytes Absolute: 0.9 10*3/uL (ref 0.1–1.0)
Monocytes Relative: 11 %
Neutro Abs: 5.6 10*3/uL (ref 1.7–7.7)
Neutrophils Relative %: 66 %
Platelets: 396 10*3/uL (ref 150–400)
RBC: 4.8 MIL/uL (ref 3.87–5.11)
RDW: 13.4 % (ref 11.5–15.5)
WBC: 8.3 10*3/uL (ref 4.0–10.5)
nRBC: 0 % (ref 0.0–0.2)

## 2021-12-26 LAB — COMPREHENSIVE METABOLIC PANEL
ALT: 14 U/L (ref 0–44)
AST: 14 U/L — ABNORMAL LOW (ref 15–41)
Albumin: 2.8 g/dL — ABNORMAL LOW (ref 3.5–5.0)
Alkaline Phosphatase: 73 U/L (ref 38–126)
Anion gap: 5 (ref 5–15)
BUN: 13 mg/dL (ref 8–23)
CO2: 30 mmol/L (ref 22–32)
Calcium: 8.9 mg/dL (ref 8.9–10.3)
Chloride: 105 mmol/L (ref 98–111)
Creatinine, Ser: 0.61 mg/dL (ref 0.44–1.00)
GFR, Estimated: >60 mL/min (ref >60)
Glucose, Bld: 86 mg/dL (ref 70–99)
Potassium: 4.2 mmol/L (ref 3.5–5.1)
Sodium: 140 mmol/L (ref 135–145)
Total Bilirubin: 0.4 mg/dL (ref 0.3–1.2)
Total Protein: 6 g/dL — ABNORMAL LOW (ref 6.5–8.1)

## 2021-12-26 NOTE — Progress Notes (Signed)
Inpatient Rehabilitation  Patient information reviewed and entered into eRehab system by Dalia Jollie M. Monda Chastain, M.A., CCC/SLP, PPS Coordinator.  Information including medical coding, functional ability and quality indicators will be reviewed and updated through discharge.    

## 2021-12-26 NOTE — Evaluation (Signed)
Speech Language Pathology Assessment and Plan  Patient Details  Name: Lauren [name] MRN: [id number] Date of Birth: 03-26-1951  SLP Diagnosis: Dysphagia;Dysarthria;Cognitive Impairments  Rehab Potential: Excellent ELOS: 2 weeks    Today's Date: 12/26/2021 SLP Individual Time: 1030-1130 SLP Individual Time Calculation (min): 60 min   Hospital Problem: Principal Problem:   Acute ischemic right MCA stroke [facility])  Past Medical History:  Past Medical History:  Diagnosis Date   Anxiety    Bipolar 1 disorder ([place])    Bipolar disorder ([place])    Breast cancer ([place])    Breast cancer ([place])    Bronchitis    CAD (coronary artery disease)    DDD (degenerative disc disease), cervical    Diabetes mellitus without complication ([place])    Emphysema lung ([place])    Hypertension    ICH (intracerebral hemorrhage) ([facility])    Lung cancer ([facility])    Occasional tremors    Parotid mass    PUD (peptic ulcer disease)    Past Surgical History:  Past Surgical History:  Procedure Laterality Date   BREAST LUMPECTOMY     CORONARY ANGIOPLASTY WITH STENT PLACEMENT     GASTRIC BYPASS  2016   KNEE ARTHROSCOPY     REPAIR OF PERFORATED ULCER  07/2021    Assessment / Plan / Recommendation Clinical Impression Patient is a 71 year old LH-female with history of CAD, HTN, T2DM, RML lung CA s/p SBRT, 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  CT head was positive for right MCA toyed cytotoxic edema with hyperdense right MCA and CTA was positive for emergent LVO R-MCA mid M1 segment with pseudonormalization of right MCA infarct.  Also noted to have 64% R-ICA and 72% L-SA stenosis, LUL indolent lung cancer that was stable compared with CT 07/30/2021  note of emphysema.  MRI brain showed moderate right MCA infarct with trace petechial hemorrhage right caudate chronic hemosiderin with encephalomalacia posterior lateral left superior frontal gyrus (new since last year) she was started on hypertonic saline and ASA added for stroke due to intracranial large vessel atherosclerosis and uncontrolled risk factors. Per records, patient had tested positive for COVID on Sunday 09/17 and husband was sick prior to patient and no quarantine needed. CT cervical spine done for work-up and showed healed old left C1 ring and left C6 and C7 fractures.  MRI brain done with contrast due to concerns of metastasis and showed no pathological enhancement.  2D echo showed EF 50 to 55% mild LVH.  Follow-up CT head 09/25 showed stable MCA infarct with evolving edema and mild mass effect. PT/OT has been working with patient who continues to be limited by left sided weakness, balance deficits, SOB with ambulation, anxiety and impulsivity. CIR recommended due to functional decline.  Patient admitted 12/25/21.  Upon arrival, patient was awake in the wheelchair with her family present. Patient was consuming coffee that her husband had thickened for her resulting in consistent coughing. Upon inspection, patient's coffee appeared to be more of a thin consistency with her husband reporting that he poured the coffee into a pre-thickened liquid water in attempts to thicken into the correct consistency. SLP retrieved several thickening packets and provided education to the patient's husband and sister on how to thicken liquids  appropriately with all questions answered at this time. During consumption of nectar-thick liquids, patient with impulsivity with left anterior spillage due to moderate oral-motor weakness resulting in intermittent overt s/s of aspiration. Overt s/s of aspiration were eliminated when patient was cued to utilize small sips and focus on forming a tight lip seal around the cup.   Impulsivity and oral residue were also noted with solid textures requiring Max verbal cues to self-monitor and correct. Recommend patient continue current diet of Dys. 3 textures with nectar-thick liquids with full supervision to maximize overall safety with PO intake.  A formal cognitive assessment was not administered. However, during functional tasks patient demonstrated a left inattention, decreased sustained attention, impulsivity, and decreased short-term memory impacting her overall safety with functional tasks. Patient also with mildly impaired speech intelligibility due to an increased speech rate with imprecise consonants. Patient would benefit from skilled SLP intervention to maximize her cognitive and swallowing function as well as her speech intelligibility prior to discharge.    Skilled Therapeutic Interventions          Administered a cognitive-linguistic evaluation and BSE, please see above for details.   SLP Assessment  Patient will need skilled Speech Lanaguage Pathology Services during CIR admission    Recommendations  SLP Diet Recommendations: Dysphagia 3 (Mech soft);Nectar Liquid Administration via: Cup Medication Administration: Whole meds with puree Supervision: Staff to assist with self feeding;Patient able to self feed;Full supervision/cueing for compensatory strategies Compensations: Minimize environmental distractions;Lingual sweep for clearance of pocketing;Slow rate;Small sips/bites Postural Changes and/or Swallow Maneuvers: Seated upright 90 degrees;Upright 30-60 min after meal Oral Care Recommendations: Oral care BID Recommendations for Other Services: Neuropsych consult Patient destination: Home Follow up Recommendations: 24 hour supervision/assistance;Home Health SLP;Outpatient SLP Equipment Recommended: To be determined    SLP Frequency 3 to 5 out of 7 days   SLP Duration  SLP Intensity  SLP Treatment/Interventions 2 weeks  Minumum of 1-2 x/day, 30 to 90  minutes  Cognitive remediation/compensation;Dysphagia/aspiration precaution training;Internal/external aids;Speech/Language facilitation;Therapeutic Activities;Environmental controls;Cueing hierarchy;Functional tasks;Patient/family education    Pain Pain Assessment Pain Scale: 0-10 Pain Score: 0-No pain  Prior Functioning Type of Home: House  Lives With: Spouse Available Help at Discharge: Available 24 hours/day  SLP Evaluation Cognition Overall Cognitive Status: Impaired/Different from baseline Arousal/Alertness: Awake/alert Orientation Level: Oriented X4 Attention: Selective;Sustained Sustained Attention: Impaired Sustained Attention Impairment: Verbal basic;Functional basic Selective Attention: Impaired Selective Attention Impairment: Verbal basic;Functional basic Memory: Impaired Memory Impairment: Storage deficit;Decreased short term memory Decreased Short Term Memory: Verbal basic;Functional basic Awareness: Impaired Awareness Impairment: Intellectual impairment Problem Solving: Impaired Problem Solving Impairment: Verbal basic;Functional basic Executive Function:  (all impaired due to lower level deficits) Safety/Judgment: Impaired  Comprehension Auditory Comprehension Overall Auditory Comprehension: Appears within functional limits for tasks assessed Expression Expression Primary Mode of Expression: Verbal Verbal Expression Overall Verbal Expression: Appears within functional limits for tasks assessed Oral Motor Oral Motor/Sensory Function Overall Oral Motor/Sensory Function: Moderate impairment Facial ROM: Reduced left;Suspected CN VII (facial) dysfunction Facial Symmetry: Abnormal symmetry left;Suspected CN VII (facial) dysfunction Facial Strength: Reduced left;Suspected CN VII (facial) dysfunction Facial Sensation: Reduced left;Suspected CN V (Trigeminal) dysfunction Lingual ROM: Reduced left;Suspected CN XII (hypoglossal) dysfunction Lingual Symmetry:  Abnormal symmetry left;Suspected CN XII (hypoglossal) dysfunction Lingual Strength: Within Functional Limits Motor Speech Overall Motor Speech: Impaired Respiration: Within functional limits Phonation: Normal Resonance: Within functional limits Articulation: Impaired Level of Impairment: Phrase Intelligibility: Intelligibility reduced Word: 75-100% accurate Phrase: 75-100% accurate Sentence: 75-100% accurate Conversation: 75-100% accurate Motor Planning: Witnin functional limits  Effective Techniques: Slow rate;Over-articulate  Care Tool Care Tool Cognition Ability to hear (with hearing aid or hearing appliances if normally used Ability to hear (with hearing aid or hearing appliances if normally used): 0. Adequate - no difficulty in normal conservation, social interaction, listening to TV   Expression of Ideas and Wants Expression of Ideas and Wants: 3. Some difficulty - exhibits some difficulty with expressing needs and ideas (e.g, some words or finishing thoughts) or speech is not clear   Understanding Verbal and Non-Verbal Content Understanding Verbal and Non-Verbal Content: 3. Usually understands - understands most conversations, but misses some part/intent of message. Requires cues at times to understand  Memory/Recall Ability Memory/Recall Ability : Current season;That he or she is in a hospital/hospital unit   Bedside Swallowing Assessment General Date of Onset: 12/19/21 Previous Swallow Assessment: MBS on 9/27: Recommended Dys. 3 textures with nectar-thick liquids with thin liquids intermittently Diet Prior to this Study: Dysphagia 3 (soft);Nectar-thick liquids Temperature Spikes Noted: No Respiratory Status: Room air History of Recent Intubation: No Behavior/Cognition: Alert;Cooperative;Requires cueing;Impulsive;Distractible Oral Cavity - Dentition: Dentures, top;Dentures, bottom Self-Feeding Abilities: Needs assist;Needs set up;Able to feed self Patient Positioning:  Upright in chair/Tumbleform Baseline Vocal Quality: Normal Volitional Cough: Strong Volitional Swallow: Able to elicit  Oral Care Assessment Oral Assessment  (WDL): Exceptions to WDL Lips: Asymmetrical Teeth: Dentures upper;Dentures lower Tongue: Moist Mucous Membrane(s): Moist;Pink Saliva: Moist, saliva free flowing Level of Consciousness: Alert Is patient on any of following O2 devices?: None of the above Nutritional status: Dysphagia Oral Assessment Risk : High Risk Ice Chips Ice chips: Not tested Thin Liquid Thin Liquid: Impaired Presentation: Cup Oral Phase Functional Implications: Left anterior spillage Pharyngeal  Phase Impairments: Cough - Immediate Nectar Thick Nectar Thick Liquid: Impaired Presentation: Self Fed;Cup Oral phase functional implications: Left anterior spillage Pharyngeal Phase Impairments: Throat Clearing - Delayed;Cough - Delayed Honey Thick Honey Thick Liquid: Not tested Puree Puree: Within functional limits Presentation: Self Fed;Spoon Solid Solid: Impaired Presentation: Self Fed;Spoon Oral Phase Impairments: Reduced labial seal;Reduced lingual movement/coordination;Impaired mastication Oral Phase Functional Implications: Left anterior spillage;Prolonged oral transit;Oral residue BSE Assessment Risk for Aspiration Impact on safety and function: Mild aspiration risk;Moderate aspiration risk Other Related Risk Factors: History of dysphagia;History of GERD;Deconditioning;Cognitive impairment;Decreased respiratory status;Previous CVA  Short Term Goals: Week 1: SLP Short Term Goal 1 (Week 1): Patient will consume current diet with minimal overt s/s of aspiration witth Mod verbal cues for use of swallowing compensatory strategies. SLP Short Term Goal 2 (Week 1): Patient will consume trials of thin liquids via cup with mod verbal cues for use of swallowing compensatory strategies and overt s/s of aspiration in less than 25% of trials. SLP Short Term  Goal 3 (Week 1): Patient will attened to left field of enviornment/body during functional tasks with mod verbal cues. SLP Short Term Goal 4 (Week 1): Patient will demonstrate sustained attention to functional tasks for 10 minutes with Mod verbal cues for redirection. SLP Short Term Goal 5 (Week 1): Patient will demonstrate functional problem solving for basic and familiar tasks with Mod verbal cues.  Refer to Care Plan for Long Term Goals  Recommendations for other services: Neuropsych  Discharge Criteria: Patient will be discharged from SLP if patient refuses treatment 3 consecutive times without medical reason, if treatment goals not met, if there is a change in medical status, if patient makes no progress towards goals or if patient is discharged from hospital.  The above assessment, treatment plan, treatment alternatives and goals were discussed  and mutually agreed upon: by patient and by family  Latise Dilley 12/26/2021, 12:57 PM

## 2021-12-26 NOTE — Evaluation (Signed)
Occupational Therapy Assessment and Plan  Patient Details  Name: Lauren [name] MRN: [id number] Date of Birth: 07-Aug-1950  OT Diagnosis: apraxia, cognitive deficits, hemiplegia affecting dominant side, and muscle weakness (generalized) Rehab Potential: Rehab Potential (ACUTE ONLY): Excellent ELOS: 2 weeks   Today's Date: 12/26/2021 OT Individual Time: 8416-6063 OT Individual Time Calculation (min): 72 min     Hospital Problem: Principal Problem:   Acute ischemic right MCA stroke ([place])   Past Medical History:  Past Medical History:  Diagnosis Date   Anxiety    Bipolar 1 disorder ([place])    Bipolar disorder ([place])    Breast cancer ([place])    Breast cancer ([place])    Bronchitis    CAD (coronary artery disease)    DDD (degenerative disc disease), cervical    Diabetes mellitus without complication ([place])    Emphysema lung ([place])    Hypertension    ICH (intracerebral hemorrhage) ([place])    Lung cancer ([facility])    Occasional tremors    Parotid mass    PUD (peptic ulcer disease)    Past Surgical History:  Past Surgical History:  Procedure Laterality Date   BREAST LUMPECTOMY     CORONARY ANGIOPLASTY WITH STENT PLACEMENT     GASTRIC BYPASS  2016   KNEE ARTHROSCOPY     REPAIR OF PERFORATED ULCER  07/2021    Assessment & Plan Clinical Impression: Patient is a 71 y.o. year old female with recent admission to the hospital on 12/20/21 after waking up and falling out of bed with left sided weakness, slurred speech and left facial droop. Imaging showed R M1 MCA CVA. Patient transferred to CIR on 12/25/2021 .    Patient currently requires mod with basic self-care skills secondary to muscle weakness, impaired timing and sequencing, abnormal tone, unbalanced muscle activation, motor apraxia, ataxia, decreased coordination, and decreased motor planning, decreased visual perceptual skills, decreased midline orientation, decreased attention to left, left side neglect, and decreased motor planning,  decreased initiation, decreased attention, decreased awareness, decreased problem solving, decreased safety awareness, decreased memory, and delayed processing, and decreased sitting balance, decreased standing balance, decreased postural control, hemiplegia, and decreased balance strategies.  Prior to hospitalization, patient could complete BADL with modified independent .  Patient will benefit from skilled intervention to increase independence with basic self-care skills prior to discharge home with care partner.  Anticipate patient will require 24 hour supervision and follow up outpatient.  OT - End of Session Endurance Deficit: Yes Endurance Deficit Description: Rest breaks within BADL tasks OT Assessment Rehab Potential (ACUTE ONLY): Excellent OT Patient demonstrates impairments in the following area(s): Balance;Behavior;Cognition;Edema;Endurance;Motor;Nutrition;Pain;Perception;Safety;Sensory;Skin Integrity;Vision OT Basic ADL's Functional Problem(s): Dressing;Toileting;Grooming;Eating;Bathing OT Transfers Functional Problem(s): Tub/Shower;Toilet OT Additional Impairment(s): Fuctional Use of Upper Extremity OT Plan OT Intensity: Minimum of 1-2 x/day, 45 to 90 minutes OT Frequency: 5 out of 7 days OT Duration/Estimated Length of Stay: 2 weeks OT Treatment/Interventions: Balance/vestibular training;Cognitive remediation/compensation;Community reintegration;Discharge planning;Disease mangement/prevention;DME/adaptive equipment instruction;Functional electrical stimulation;Functional mobility training;Neuromuscular re-education;Pain management;Patient/family education;Psychosocial support;Self Care/advanced ADL retraining;Skin care/wound managment;Splinting/orthotics;Therapeutic Activities;Therapeutic Exercise;UE/LE Strength taining/ROM;UE/LE Coordination activities;Visual/perceptual remediation/compensation;Wheelchair propulsion/positioning OT Self Feeding Anticipated Outcome(s): Supervision OT  Basic Self-Care Anticipated Outcome(s): Supervision OT Toileting Anticipated Outcome(s): Supervision OT Bathroom Transfers Anticipated Outcome(s): Supervision OT Recommendation Recommendations for Other Services: Neuropsych consult Patient destination: Home Follow Up Recommendations: Outpatient OT;24 hour supervision/assistance Equipment Recommended: To be determined   OT Evaluation Precautions/Restrictions  Precautions Precautions: Fall Precaution Comments: watch O2 Restrictions Weight Bearing Restrictions: No Pain  Denies pain Home Living/Prior Functioning Home Living Family/patient expects  to be discharged to:: Private residence Living Arrangements: Spouse/significant other Available Help at Discharge: Available 24 hours/day Type of Home: House Home Access: Stairs to enter [employer] of Steps: 1+1 carport Entrance Stairs-Rails: None Home Layout: Two level Alternate Level Stairs-Number of Steps: flight. Full bath in Basement. Bath on main level is being renovated Alternate Level Stairs-Rails: Right Bathroom Shower/Tub: Multimedia programmer: Handicapped height Bathroom Accessibility: Yes  Lives With: Spouse IADL History Homemaking Responsibilities: Yes Current License: Yes IADL Comments: Enjoys reading and driving. Does Middle Age Re-enactments. 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[place]) Prior Function Level of Independence: Independent with transfers, Independent with gait, Independent with homemaking with ambulation, Independent with basic ADLs, Requires assistive device for independence (Reportedly uses a cane occasionally)  Able to Take Stairs?: Yes Vision Baseline Vision/History: 1 Wears glasses Ability to See in Adequate Light: 0 Adequate Patient Visual Report: Blurring of vision;[place]iplopia Vision Assessment?: Vision impaired- to be further tested in functional context Perception  Perception: Impaired Inattention/Neglect: Does  not attend to left side of body Praxis Praxis: Intact Cognition Cognition Overall Cognitive Status: Impaired/Different from baseline Arousal/Alertness: Awake/alert Memory: Impaired Memory Impairment: Storage deficit;Decreased short term memory Decreased Short Term Memory: Verbal basic;Functional basic Attention: Selective;Sustained Sustained Attention: Impaired Sustained Attention Impairment: Verbal basic;Functional basic Selective Attention: Impaired Selective Attention Impairment: Verbal basic;Functional basic Awareness: Impaired Awareness Impairment: Intellectual impairment Problem Solving: Impaired Problem Solving Impairment: Verbal basic;Functional basic Executive Function:  (all impaired due to lower level deficits) Safety/Judgment: Impaired Brief Interview for Mental Status (BIMS) Repetition of Three Words (First Attempt): 3 Temporal Orientation: Year: Correct Temporal Orientation: Month: Accurate within 5 days Temporal Orientation: Day: Correct Recall: "Sock": Yes, no cue required Recall: "Blue": Yes, no cue required Recall: "Bed": Yes, no cue required BIMS Summary Score: 15 Sensation Sensation Light Touch: Impaired Detail Light Touch Impaired Details: Impaired LUE;Impaired LLE (pt reports mild sensation changes in L side) Coordination Gross Motor Movements are Fluid and Coordinated: No Fine Motor Movements are Fluid and Coordinated: No Coordination and Movement Description: Decrease dsmoothness and accuracy with L hand- pt is L handed Heel Shin Test: Decreased coordination on L Motor  Motor Motor: Hemiplegia  Balance Static Sitting Balance Static Sitting - Balance Support: Feet supported Static Sitting - Level of Assistance: 5: Stand by assistance Dynamic Sitting Balance Dynamic Sitting - Level of Assistance: 5: Stand by assistance;4: Min assist Static Standing Balance Static Standing - Balance Support: During functional activity Static Standing - Level of  Assistance: 4: Min assist Dynamic Standing Balance Dynamic Standing - Balance Support: During functional activity Dynamic Standing - Level of Assistance: 4: Min assist;3: Mod assist Extremity/Trunk Assessment RUE Assessment RUE Assessment: Within Functional Limits LUE Assessment LUE Assessment: Exceptions to [facility] General Strength Comments: Grossly 3/5 LUE Body System: Neuro Brunstrum levels for arm and hand: Arm;Hand Brunstrum level for arm: Stage V Relative Independence from Synergy Brunstrum level for hand: Stage VI Isolated joint movements  Care Tool Care Tool Self Care Eating   Eating Assist Level: Maximal Assistance - Patient 25 - 49%    Oral Care    Oral Care Assist Level: Minimal Assistance - Patient > 75%    Bathing   Body parts bathed by patient: Right arm;Left arm;Chest;Abdomen;Front perineal area;Buttocks;Right upper leg;Left upper leg;Face Body parts bathed by helper: Right lower leg;Left lower leg   Assist Level: Minimal Assistance - Patient > 75%    Upper Body Dressing(including orthotics)   What is the patient wearing?:  Pull over shirt   Assist Level: Minimal Assistance - Patient > 75%    Lower Body Dressing (excluding footwear)   What is the patient wearing?: Underwear/pull up;Pants Assist for lower body dressing: Maximal Assistance - Patient 25 - 49%    Putting on/Taking off footwear   What is the patient wearing?: Non-skid slipper socks Assist for footwear: Maximal Assistance - Patient 25 - 49%       Care Tool Toileting Toileting activity   Assist for toileting: Moderate Assistance - Patient 50 - 74%     Care Tool Bed Mobility Roll left and right activity   Roll left and right assist level: Minimal Assistance - Patient > 75%    Sit to lying activity   Sit to lying assist level: Minimal Assistance - Patient > 75%    Lying to sitting on side of bed activity   Lying to sitting on side of bed assist level: the ability to move from lying on the back  to sitting on the side of the bed with no back support.: Minimal Assistance - Patient > 75%     Care Tool Transfers Sit to stand transfer   Sit to stand assist level: Moderate Assistance - Patient 50 - 74%    Chair/bed transfer   Chair/bed transfer assist level: Moderate Assistance - Patient 50 - 74%     Toilet transfer   Assist Level: Moderate Assistance - Patient 50 - 74%     Care Tool Cognition  Expression of Ideas and Wants Expression of Ideas and Wants: 3. Some difficulty - exhibits some difficulty with expressing needs and ideas (e.g, some words or finishing thoughts) or speech is not clear  Understanding Verbal and Non-Verbal Content Understanding Verbal and Non-Verbal Content: 3. Usually understands - understands most conversations, but misses some part/intent of message. Requires cues at times to understand   Memory/Recall Ability Memory/Recall Ability : Current season;That he or she is in a hospital/hospital unit   Refer to Care Plan for [place] 1 OT Short Term Goal 1 (Week 1): Patient will use L UE to self feed with min A OT Short Term Goal 2 (Week 1): Patient will complete 2 steps of toileting with min A OT Short Term Goal 3 (Week 1): Patient will locate 2 items on L side of sink with min questioning cues.  Recommendations for other services: Neuropsych   Skilled Therapeutic Intervention Initial eval completed with treatment provided to address functional transfers, functional use of L UE, improved sit<>stand, standing tolerance, and adapted bathing/dressing skills. Pt came to sitting EOB with min A. Functional ambulation without AD with min/mod A. Pt with poor attention to L side and decreased safety awareness. Pt very tangential and needed cues throughout session to stay on task.  Min A for balance when reaching to wash buttocks. LB/UB dressing completed wc level at the sink with assistance to thread L LE into clothing and assist to pull up  pants. UB dressing with min A overall 2/2 L hemiplegia. Pt also reported double vision, decreased proprioceptive deficits, undershooting for objects, and apraxia. See functional navigator for further details. Pt left seated in wc with spouse present, alarm belt on, and needs met. ADL ADL Eating: Maximal assistance Grooming: Moderate assistance Upper Body Bathing: Minimal assistance Lower Body Bathing: Minimal assistance Upper Body Dressing: Minimal assistance Lower Body Dressing: Maximal assistance Toileting: Moderate assistance Toilet Transfer: Moderate assistance Tub/Shower Transfer: Moderate assistance Mobility  Bed Mobility Bed  Mobility: Supine to Sit;Sit to Supine Supine to Sit: Minimal Assistance - Patient > 75% Sit to Supine: Minimal Assistance - Patient > 75% Transfers Sit to Stand: Minimal Assistance - Patient > 75% Stand to Sit: Minimal Assistance - Patient > 75%   Discharge Criteria: Patient will be discharged from OT if patient refuses treatment 3 consecutive times without medical reason, if treatment goals not met, if there is a change in medical status, if patient makes no progress towards goals or if patient is discharged from hospital.  The above assessment, treatment plan, treatment alternatives and goals were discussed and mutually agreed upon: by patient  Valma Cava 12/26/2021, 3:45 PM

## 2021-12-26 NOTE — Plan of Care (Signed)
  Problem: RH Swallowing Goal: LTG Patient will consume least restrictive diet using compensatory strategies with assistance (SLP) Description: LTG:  Patient will consume least restrictive diet using compensatory strategies with assistance (SLP) Flowsheets (Taken 12/26/2021 1237) LTG: Pt Patient will consume least restrictive diet using compensatory strategies with assistance of (SLP): Minimal Assistance - Patient > 75% Goal: LTG Pt will demonstrate functional change in swallow as evidenced by bedside/clinical objective assessment (SLP) Description: LTG: Patient will demonstrate functional change in swallow as evidenced by bedside/clinical objective assessment (SLP) Flowsheets (Taken 12/26/2021 1237) LTG: Patient will demonstrate functional change in swallow as evidenced by bedside/clinical objective assessment: Oropharyngeal swallow   Problem: RH Problem Solving Goal: LTG Patient will demonstrate problem solving for (SLP) Description: LTG:  Patient will demonstrate problem solving for basic/complex daily situations with cues  (SLP) Flowsheets (Taken 12/26/2021 1237) LTG: Patient will demonstrate problem solving for (SLP): Basic daily situations LTG Patient will demonstrate problem solving for: Minimal Assistance - Patient > 75%   Problem: RH Memory Goal: LTG Patient will use memory compensatory aids to (SLP) Description: LTG:  Patient will use memory compensatory aids to recall biographical/new, daily complex information with cues (SLP) Flowsheets (Taken 12/26/2021 1237) LTG: Patient will use memory compensatory aids to (SLP): Minimal Assistance - Patient > 75%   Problem: RH Attention Goal: LTG Patient will demonstrate this level of attention during functional activites (SLP) Description: LTG:  Patient will will demonstrate this level of attention during functional activites (SLP) Flowsheets (Taken 12/26/2021 1237) Patient will demonstrate during cognitive/linguistic activities the attention  type of: Selective LTG: Patient will demonstrate this level of attention during cognitive/linguistic activities with assistance of (SLP): Minimal Assistance - Patient > 75%   Problem: RH Awareness Goal: LTG: Patient will demonstrate awareness during functional activites type of (SLP) Description: LTG: Patient will demonstrate awareness during functional activites type of (SLP) Flowsheets (Taken 12/26/2021 1237) Patient will demonstrate during cognitive/linguistic activities awareness type of: Emergent LTG: Patient will demonstrate awareness during cognitive/linguistic activities with assistance of (SLP): Minimal Assistance - Patient > 75%

## 2021-12-26 NOTE — Progress Notes (Signed)
Received phone call from radiology. Chest x-ray will be done after 7 am.

## 2021-12-26 NOTE — Evaluation (Signed)
Physical Therapy Assessment and Plan  Patient Details  Name: Lauren [name] MRN: [id number] Date of Birth: [birth year]  PT Diagnosis: Ataxic gait, Difficulty walking, Hemiplegia non-dominant, and Muscle weakness Rehab Potential: Good ELOS: 2.5 weeks   Today's Date: 12/26/2021 PT Individual Time: 1300-1415 PT Individual Time Calculation (min): 75 min    Hospital Problem: Principal Problem:   Acute ischemic right MCA stroke [facility])   Past Medical History:  Past Medical History:  Diagnosis Date   Anxiety    Bipolar 1 disorder ([place])    Bipolar disorder ([place])    Breast cancer ([place])    Breast cancer ([place])    Bronchitis    CAD (coronary artery disease)    DDD (degenerative disc disease), cervical    Diabetes mellitus without complication ([place])    Emphysema lung ([place])    Hypertension    ICH (intracerebral hemorrhage) ([place])    Lung cancer ([place])    Occasional tremors    Parotid mass    PUD (peptic ulcer disease)    Past Surgical History:  Past Surgical History:  Procedure Laterality Date   BREAST LUMPECTOMY     CORONARY ANGIOPLASTY WITH STENT PLACEMENT     GASTRIC BYPASS  2016   KNEE ARTHROSCOPY     REPAIR OF PERFORATED ULCER  07/2021    Assessment & Plan Clinical Impression: Patient is a 71 year old LH-female with history of CAD, HTN, T2DM, RML lung CA s/p SBRT, 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  CT head was positive for right MCA toyed cytotoxic edema with hyperdense right MCA and CTA was positive for emergent LVO R-MCA mid M1 segment with pseudonormalization of right MCA infarct.  Also noted to have 64% R-ICA and 72% L-SA stenosis, LUL indolent lung cancer that was stable compared with CT  07/30/2021 note of emphysema.  MRI brain showed moderate right MCA infarct with trace petechial hemorrhage right caudate chronic hemosiderin with encephalomalacia posterior lateral left superior frontal gyrus (new since last year) she was started on hypertonic saline and ASA added for stroke due to intracranial large vessel atherosclerosis and uncontrolled risk factors. Per records, patient had tested positive for COVID on Sunday 09/17 and husband was sick prior to patient and no quarantine needed.   CT cervical spine done for work-up and showed healed old left C1 ring and left C6 and C7 fractures.  MRI brain done with contrast due to concerns of metastasis and showed no pathological enhancement.  2D echo showed EF 50 to 55% mild LVH.  Follow-up CT head 09/25 showed stable MCA infarct with evolving edema and mild mass effect.  Hypertonic saline was weaned off.  Speech therapy following for diet tolerance and this was slowly advanced to dysphagia 3, nectar liquids.    Dr. Leonie Man felt that she would likely need DAPT with aggressive risk factor modification at discharge and has discussed this with husband. PT/OT has been working with patient who continues to be limited by left sided weakness, balance deficits, SOB with ambulation, anxiety and impulsivity.  Patient transferred to CIR on 12/25/2021 .   Patient currently requires max with mobility secondary to muscle weakness, decreased cardiorespiratoy endurance, impaired timing and sequencing, ataxia, and decreased coordination, decreased attention to left, decreased awareness,  decreased problem solving, and decreased safety awareness, and decreased sitting balance, decreased standing balance, decreased postural control, hemiplegia, and decreased balance strategies.  Prior to hospitalization, patient was independent  with mobility and lived with Spouse in a House home.  Home access is 1+1 carportStairs to enter.  Patient will benefit from skilled PT intervention  to maximize safe functional mobility, minimize fall risk, and decrease caregiver burden for planned discharge home with 24 hour supervision.  Anticipate patient will benefit from follow up [place] at discharge.  PT - End of Session Endurance Deficit: Yes Endurance Deficit Description: Rest breaks within BADL tasks   PT Evaluation Precautions/Restrictions Precautions Precautions: Fall Precaution Comments: watch O2 Restrictions Weight Bearing Restrictions: No General Chart Reviewed: Yes Family/Caregiver Present: Yes Vital SignsTherapy Vitals Temp: (!) 97.5 F (36.4 C) Temp Source: Oral Pulse Rate: 71 Resp: 16 BP: 138/82 Patient Position (if appropriate): Sitting Oxygen Therapy SpO2: 94 % O2 Device: Room Air Pain Interference Pain Interference Pain Effect on Sleep: 2. Occasionally Pain Interference with Therapy Activities: 1. Rarely or not at all Pain Interference with Day-to-Day Activities: 1. Rarely or not at all Home Living/Prior [place] Available Help at Discharge: Available 24 hours/day Type of Home: House Home Access: Stairs to enter [employer] of Steps: 1+1 carport Entrance Stairs-Rails: None Home Layout: Two level Alternate Level Stairs-Number of Steps: flight. Full bath in Basement. Bath on main level is being renovated Alternate Level Stairs-Rails: Right Bathroom Shower/Tub: Multimedia programmer: Handicapped height Bathroom Accessibility: Yes  Lives With: Spouse Prior Function Level of Independence: Independent with transfers;Independent with gait;Independent with homemaking with ambulation;Independent with basic ADLs;Requires assistive device for independence (Reportedly uses a cane occasionally)  Able to Take Stairs?: Yes Vision/Perception  Vision - History Ability to See in Adequate Light: 0 Adequate Perception Perception: Impaired Inattention/Neglect: Does not attend to left side of body Praxis Praxis: Intact   Cognition Overall Cognitive Status: Impaired/Different from baseline Arousal/Alertness: Awake/alert Orientation Level: Oriented X4 Attention: Selective;Sustained Sustained Attention: Impaired Sustained Attention Impairment: Verbal basic;Functional basic Selective Attention: Impaired Selective Attention Impairment: Verbal basic;Functional basic Memory: Impaired Memory Impairment: Storage deficit;Decreased short term memory Decreased Short Term Memory: Verbal basic;Functional basic Awareness: Impaired Awareness Impairment: Intellectual impairment Problem Solving: Impaired Problem Solving Impairment: Verbal basic;Functional basic Executive Function:  (all impaired due to lower level deficits) Safety/Judgment: Impaired Sensation Sensation Light Touch: Impaired Detail Light Touch Impaired Details: Impaired LUE;Impaired LLE (pt reports mild sensation changes in L side) Coordination Gross Motor Movements are Fluid and Coordinated: No Fine Motor Movements are Fluid and Coordinated: No Coordination and Movement Description: Decrease dsmoothness and accuracy with L hand- pt is L handed Heel Shin Test: Decreased coordination on L Motor  Motor Motor: Hemiplegia   Trunk/Postural Assessment  Cervical Assessment Cervical Assessment: Exceptions to [facility] (forward head) Thoracic Assessment Thoracic Assessment: Exceptions to [facility] (rounded shoulders) Lumbar Assessment Lumbar Assessment: Exceptions to [facility] (posterior pelvic tilt) Postural Control Postural Control: Deficits on evaluation Righting Reactions: delayed and inadequate  Balance Static Sitting Balance Static Sitting - Balance Support: Feet supported Static Sitting - Level of Assistance: 5: Stand by assistance Dynamic Sitting Balance Dynamic Sitting - Level of Assistance: 5: Stand by assistance;4: Min assist Static Standing Balance Static Standing - Balance Support: During functional activity Static Standing - Level of Assistance:  4: Min assist Dynamic Standing Balance Dynamic Standing - Balance Support: During functional activity Dynamic Standing - Level of Assistance: 4: Min assist;3: Mod assist Extremity Assessment  RUE Assessment RUE Assessment: Within  Functional Limits LUE Assessment LUE Assessment: Exceptions to [facility] General Strength Comments: Grossly 3/5 LUE Body System: Neuro Brunstrum levels for arm and hand: Arm;Hand Brunstrum level for arm: Stage V Relative Independence from Synergy Brunstrum level for hand: Stage VI Isolated joint movements RLE Assessment General Strength Comments: Hip flexion 4/5, otherwise grossly WFL LLE Assessment General Strength Comments: Hip Flexion 3/5, Otherwise grossly 4/5  Care Tool Care Tool Bed Mobility Roll left and right activity   Roll left and right assist level: Minimal Assistance - Patient > 75%    Sit to lying activity   Sit to lying assist level: Minimal Assistance - Patient > 75%    Lying to sitting on side of bed activity   Lying to sitting on side of bed assist level: the ability to move from lying on the back to sitting on the side of the bed with no back support.: Minimal Assistance - Patient > 75%     Care Tool Transfers Sit to stand transfer   Sit to stand assist level: Moderate Assistance - Patient 50 - 74%    Chair/bed transfer   Chair/bed transfer assist level: Moderate Assistance - Patient 50 - 74%     Toilet transfer   Assist Level: Moderate Assistance - Patient 50 - 74%    Car transfer   Car transfer assist level: Moderate Assistance - Patient 50 - 74%      Care Tool Locomotion Ambulation   Assist level: Maximal Assistance - Patient 25 - 49% Assistive device: No Device Max distance: 150'  Walk 10 feet activity   Assist level: Maximal Assistance - Patient 25 - 49% Assistive device: No Device   Walk 50 feet with 2 turns activity   Assist level: Maximal Assistance - Patient 25 - 49% Assistive device: No Device  Walk 150 feet  activity   Assist level: Maximal Assistance - Patient 25 - 49% Assistive device: No Device  Walk 10 feet on uneven surfaces activity   Assist level: Moderate Assistance - Patient - 50 - 74% Assistive device:  (R handrail)  Stairs   Assist level: Maximal Assistance - Patient 25 - 49% Stairs assistive device: 2 hand rails Max number of stairs: 12  Walk up/down 1 step activity   Walk up/down 1 step (curb) assist level: Maximal Assistance - Patient 25 - 49% Walk up/down 1 step or curb assistive device: 2 hand rails  Walk up/down 4 steps activity   Walk up/down 4 steps assist level: Maximal Assistance - Patient 25 - 49% Walk up/down 4 steps assistive device: 2 hand rails  Walk up/down 12 steps activity   Walk up/down 12 steps assist level: Maximal Assistance - Patient 25 - 49% Walk up/down 12 steps assistive device: 2 hand rails  Pick up small objects from floor        Wheelchair Is the patient using a wheelchair?: Yes Type of Wheelchair: Manual   Wheelchair assist level: Total Assistance - Patient < 25% Max wheelchair distance: 150'  Wheel 50 feet with 2 turns activity   Assist Level: Total Assistance - Patient < 25%  Wheel 150 feet activity   Assist Level: Total Assistance - Patient < 25%    Refer to Care Plan for Long Term Goals  SHORT TERM GOAL WEEK 1 PT Short Term Goal 1 (Week 1): Pt will perform bed mobility with CGA PT Short Term Goal 2 (Week 1): Pt will perform bed to chair transfer consistently with minA. PT Short Term Goal 3 (  Week 1): Pt will ambulate x150' consistently with minA and LRAD.  Recommendations for other services: None   Skilled Therapeutic Intervention  Evaluation completed (see details above and below) with education on PT POC and goals and individual treatment initiated with focus on balance, bed mobility, transfers, ambulation, and stair training. Pt received seated in WC and agrees to therapy. No complaint of pain. WC transport to gym for time  management. Pt performs car transfer and ramp navigation with modA with multimodal cues for sequencing, avoiding use of car door for support, and use of hand rail for safety. Following seated rest break, pt ambulates x150' without AD. Pt at times ambulates with minA but consistently drags or catches L foot during swing phase and has complete LOBs anteriorly, requiring maxA to prevent fall (at least 3 different instances). Multimodal cues for posture, increasing step heigh on L, lateral weight shifting, navigation, and redirecting pt to task due to pt becoming very easily externally distracted. Following rest break, pt completes x12 6" steps, again requiring maxA due to attempting to ascend steps with hemiplegic L lower extremity, as well as difficulty safely managing hand rails. PT cues for optimal step sequencing and well as foot placement. WC transport back to room. Pt ambulates to toilet with modA and cues for sequencing and positioning. Ambulatory transfer back to bed. Pt performs sit to supine with minA and cues for positioning and sequencing. Left in L sidelying with alarm intact and all needs within reach.  Mobility Bed Mobility Bed Mobility: Supine to Sit;Sit to Supine Supine to Sit: Minimal Assistance - Patient > 75% Sit to Supine: Minimal Assistance - Patient > 75% Transfers Transfers: Stand to Sit;Sit to Stand;Stand Pivot Transfers Sit to Stand: Minimal Assistance - Patient > 75% Stand to Sit: Minimal Assistance - Patient > 75% Stand Pivot Transfers: Moderate Assistance - Patient 50 - 74% Locomotion  Gait Ambulation: Yes Gait Assistance: Maximal Assistance - Patient 25-49% Gait Distance (Feet): 150 Feet Assistive device: None Gait Assistance Details: Tactile cues for sequencing;Tactile cues for posture;Verbal cues for technique;Verbal cues for precautions/safety;Verbal cues for gait pattern Gait Gait: Yes Gait Pattern: Impaired Gait Pattern: Ataxic (consistently drags L foot during  swing phase) Gait velocity: decreased Stairs / Additional Locomotion Stairs: Yes Stairs Assistance: Maximal Assistance - Patient 25 - 49% Stair Management Technique: Two rails Number of Stairs: 12 Height of Stairs: 6 Ramp: Moderate Assistance - Patient 50 - 74% Curb: Maximal Assistance - Patient 25 - 49% Wheelchair Mobility Wheelchair Mobility: Yes Wheelchair Assistance: Maximal Assistance - Patient 25 - 49% Wheelchair Propulsion: Both upper extremities Wheelchair Parts Management: Needs assistance Distance: 150'   Discharge Criteria: Patient will be discharged from PT if patient refuses treatment 3 consecutive times without medical reason, if treatment goals not met, if there is a change in medical status, if patient makes no progress towards goals or if patient is discharged from hospital.  The above assessment, treatment plan, treatment alternatives and goals were discussed and mutually agreed upon: by patient  Breck Coons, PT, DPT 12/26/2021, 3:59 PM

## 2021-12-26 NOTE — Progress Notes (Signed)
   12/26/21 1200  Clinical Encounter Type  Visited With Patient and family together  Visit Type Initial;Social support;Other (Comment) (Advanced Directive)  Referral From Nurse  Consult/Referral To Chaplain   Chaplain responded to a spiritual consult for support. The patient, Lauren [name], expressed frustration over the stroke she recently had and the physical response of her body. She  shared that she had a stoke prior to this one and with hard work recovered most of her capabilities prior. This stroke is proving to be more challenging and her acceptance of it is challenging. Lauren [name] explored what acceptance might look like as well as giving herself some space to heal.  We also went over the advanced directive forms works and I left that paperwork with Tana and her spouse.   Danice Goltz [facility]  [phone]

## 2021-12-26 NOTE — Plan of Care (Signed)
Problem: RH Balance Goal: LTG: Patient will maintain dynamic sitting balance (OT) Description: LTG:  Patient will maintain dynamic sitting balance with assistance during activities of daily living (OT) Flowsheets (Taken 12/26/2021 1430) LTG: Pt will maintain dynamic sitting balance during ADLs with: Independent Goal: LTG Patient will maintain dynamic standing with ADLs (OT) Description: LTG:  Patient will maintain dynamic standing balance with assist during activities of daily living (OT)  Flowsheets (Taken 12/26/2021 1430) LTG: Pt will maintain dynamic standing balance during ADLs with: Supervision/Verbal cueing   Problem: Sit to Stand Goal: LTG:  Patient will perform sit to stand in prep for activites of daily living with assistance level (OT) Description: LTG:  Patient will perform sit to stand in prep for activites of daily living with assistance level (OT) Flowsheets (Taken 12/26/2021 1430) LTG: PT will perform sit to stand in prep for activites of daily living with assistance level: Supervision/Verbal cueing   Problem: RH Eating Goal: LTG Patient will perform eating w/assist, cues/equip (OT) Description: LTG: Patient will perform eating with assist, with/without cues using equipment (OT) Flowsheets (Taken 12/26/2021 1430) LTG: Pt will perform eating with assistance level of: Supervision/Verbal cueing   Problem: RH Grooming Goal: LTG Patient will perform grooming w/assist,cues/equip (OT) Description: LTG: Patient will perform grooming with assist, with/without cues using equipment (OT) Flowsheets (Taken 12/26/2021 1430) LTG: Pt will perform grooming with assistance level of: Supervision/Verbal cueing   Problem: RH Bathing Goal: LTG Patient will bathe all body parts with assist levels (OT) Description: LTG: Patient will bathe all body parts with assist levels (OT) Flowsheets (Taken 12/26/2021 1430) LTG: Pt will perform bathing with assistance level/cueing: Supervision/Verbal cueing    Problem: RH Dressing Goal: LTG Patient will perform upper body dressing (OT) Description: LTG Patient will perform upper body dressing with assist, with/without cues (OT). Flowsheets (Taken 12/26/2021 1430) LTG: Pt will perform upper body dressing with assistance level of: Supervision/Verbal cueing Goal: LTG Patient will perform lower body dressing w/assist (OT) Description: LTG: Patient will perform lower body dressing with assist, with/without cues in positioning using equipment (OT) Flowsheets (Taken 12/26/2021 1430) LTG: Pt will perform lower body dressing with assistance level of: Supervision/Verbal cueing   Problem: RH Toileting Goal: LTG Patient will perform toileting task (3/3 steps) with assistance level (OT) Description: LTG: Patient will perform toileting task (3/3 steps) with assistance level (OT)  Flowsheets (Taken 12/26/2021 1430) LTG: Pt will perform toileting task (3/3 steps) with assistance level: Supervision/Verbal cueing   Problem: RH Vision Goal: RH LTG Vision Theme park manager) Flowsheets (Taken 12/26/2021 1430) LTG: Vision Goals: Patient will locate items on L side with min questioning cues   Problem: RH Functional Use of Upper Extremity Goal: LTG Patient will use RT/LT upper extremity as a (OT) Description: LTG: Patient will use right/left upper extremity as a stabilizer/gross assist/diminished/nondominant/dominant level with assist, with/without cues during functional activity (OT) Flowsheets (Taken 12/26/2021 1430) LTG: Use of upper extremity in functional activities: LUE as diminished level   Problem: RH Toilet Transfers Goal: LTG Patient will perform toilet transfers w/assist (OT) Description: LTG: Patient will perform toilet transfers with assist, with/without cues using equipment (OT) Flowsheets (Taken 12/26/2021 1430) LTG: Pt will perform toilet transfers with assistance level of: Supervision/Verbal cueing   Problem: RH Tub/Shower Transfers Goal: LTG Patient will  perform tub/shower transfers w/assist (OT) Description: LTG: Patient will perform tub/shower transfers with assist, with/without cues using equipment (OT) Flowsheets (Taken 12/26/2021 1430) LTG: Pt will perform tub/shower stall transfers with assistance level of: Supervision/Verbal cueing  Problem: RH Attention Goal: LTG Patient will demonstrate this level of attention during functional activites (OT) Description: LTG:  Patient will demonstrate this level of attention during functional activites  (OT) Flowsheets (Taken 12/26/2021 1430) Patient will demonstrate above attention level in the following environment: Home LTG: Patient will demonstrate this level of attention during functional activites (OT): Supervision   Problem: RH Awareness Goal: LTG: Patient will demonstrate awareness during functional activites type of (OT) Description: LTG: Patient will demonstrate awareness during functional activites type of (OT) Flowsheets (Taken 12/26/2021 1430) Patient will demonstrate awareness during functional activites type of: Intellectual LTG: Patient will demonstrate awareness during functional activites type of (OT): Supervision

## 2021-12-26 NOTE — Progress Notes (Signed)
Patient ID: Lauren [name], female   DOB: March 13, 1951, 71 y.o.   MRN: [id number]  SW second attempt to meet with pt to complete assessment but pt in therapies. SW will continue to make efforts to complete, and will follow for d/c needs.   Loralee Pacas, MSW, [place] Office: [phone] Cell: [phone] Fax: [phone]

## 2021-12-26 NOTE — Progress Notes (Signed)
Placed patient on CPAP for the night via auto-mode.  

## 2021-12-26 NOTE — Progress Notes (Signed)
Patient denies pain or discomfort throughout the shift. Patient is continent of bowel and bladder. Patient has urinating every half hour throughout the day. Patient states she does urinate a lot at home also. No C/O painful urination, no odor noted.

## 2021-12-26 NOTE — Progress Notes (Signed)
PROGRESS NOTE   Subjective/Complaints:  Patient seen and examined preparing for shower with OT. Generally doing well, overall depressed regarding repeated hospitalizations and death of her mother this year. States she is "terrified" to go home.  She endorses pain on her sacrum but otherwise no issues. Husband states she has also had R sided headaches.   ROS: Denies fevers, chills, N/V, abdominal pain, constipation, diarrhea, SOB, cough, chest pain, new weakness or paraesthesias.    Objective:   DG Chest 2 View  Result Date: 12/26/2021 CLINICAL DATA:  Cough EXAM: CHEST - 2 VIEW COMPARISON:  December 21, 2021. FINDINGS: The heart size and mediastinal contours are within normal limits. Aortic atherosclerosis. Coronary artery calcifications. No focal airspace consolidation. Chronic bronchitic lung changes with pulmonary hyperinflation and emphysema. Similar left upper lobe pulmonary nodule. The visualized skeletal structures are unremarkable. IMPRESSION: Similar chronic lung changes without acute cardiopulmonary disease. Electronically Signed   By: Dahlia Bailiff M.D.   On: 12/26/2021 09:17   Recent Labs    12/25/21 0551 12/26/21 0631  WBC 10.8* 8.3  HGB 13.2 13.7  HCT 40.1 42.8  PLT 385 396   Recent Labs    12/25/21 0551 12/26/21 0631  NA 140 140  K 3.9 4.2  CL 105 105  CO2 26 30  GLUCOSE 103* 86  BUN 11 13  CREATININE 0.43* 0.61  CALCIUM 8.9 8.9    Intake/Output Summary (Last 24 hours) at 12/26/2021 1046 Last data filed at 12/26/2021 0900 Gross per 24 hour  Intake 480 ml  Output --  Net 480 ml        Physical Exam: Vital Signs Blood pressure 110/62, pulse 63, temperature 97.6 F (36.4 C), resp. rate 14, height 5' (1.524 m), weight 42.7 kg, SpO2 96 %. Constitutional: No apparent distress. Appropriate appearance for age.  HENT: No JVD. Neck Supple. Trachea midline. Atraumatic, normocephalic. Eyes: PERRLA. EOMI.  Visual fields grossly intact.  Cardiovascular: RRR, no murmurs/rub/gallops. No Edema. Peripheral pulses 2+  Respiratory: CTAB. No rales, rhonchi, or wheezing. On RA.  Abdomen: + bowel sounds, normoactive. No distention or tenderness.  Skin: Mild area of stage I DTI over sacrum, without skin disruption or drainage. +sacral heart.   MSK:      No apparent deformity. +RUE IV.       Strength: Moving all 4 limbs antigravity and against resistance. From prior exam:  RUE 5/5 RLE- HF 4+/5; KE 5-/5; DF and PF 5/5 LUE- Biceps 4/5; Triceps 4/5; WE 4+/5; Grip 4+/5; and FA 3+/5 LLE- HF 4/5; KE 4+/5; DF/PF 4+/5  Neurologic exam:  Cognition: AAO to person, place, time and event. + L inattention,  Language: Fluent, No substitutions or neoglisms.  Mood: Mildly pressured speech and impulsivity, ?manic.  CN: +L facial droop, with some drooling.         Assessment/Plan: 1. Functional deficits which require 3+ hours per day of interdisciplinary therapy in a comprehensive inpatient rehab setting. Physiatrist is providing close team supervision and 24 hour management of active medical problems listed below. Physiatrist and rehab team continue to assess barriers to discharge/monitor patient progress toward functional and medical goals  Care Tool:  Bathing  Bathing assist       Upper Body Dressing/Undressing Upper body dressing        Upper body assist      Lower Body Dressing/Undressing Lower body dressing            Lower body assist       Toileting Toileting    Toileting assist Assist for toileting: Minimal Assistance - Patient > 75%     Transfers Chair/bed transfer  Transfers assist           Locomotion Ambulation   Ambulation assist              Walk 10 feet activity   Assist           Walk 50 feet activity   Assist           Walk 150 feet activity   Assist           Walk 10 feet on uneven surface   activity   Assist           Wheelchair     Assist               Wheelchair 50 feet with 2 turns activity    Assist            Wheelchair 150 feet activity     Assist          Blood pressure 110/62, pulse 63, temperature 97.6 F (36.4 C), resp. rate 14, height 5' (1.524 m), weight 42.7 kg, SpO2 96 %.  Medical Problem List and Plan: 1. Functional deficits secondary to R MCA stroke with L sensory deficits and L hemiparesis as well as dysphagia and L inattention             -patient may  shower             -ELOS/Goals: 10-14 days- supervision maybe min A due to sensory deficits 2.  Antithrombotics: -DVT/anticoagulation:  Pharmaceutical: Lovenox             -antiplatelet therapy: ASA. Will contact Neuro re: addition of Plavix - ?.  3. DDD Cervical spine/Pain Management: tylenol prn.   4. Mood/Behavior/Sleep:  LCSW to follow for evaluation and support.              -antipsychotic agents:  Continue              - Consider neuropsych consult next week if appropriate 5. Neuropsych/cognition: This patient may be intermittently capable of making decisions on her own behalf. 6. Skin/Wound Care: Routine pressure relief measures.            - Sacral Stage 1 DTI - Sacral heart, change PRN. Nursing to get photo today 7. Fluids/Electrolytes/Nutrition: Monitor I/O. Check CMET in am. 8. HTN: Monitor BP TID 9. CAD: Monitor for symptoms with increase in activity. Continue ASA, Lipitor 10. 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  --Stable on Breztri PTA and husband provided from home.  11. Chronic tremors: Managed with inderal and Lamictal--worse in am and gets better as day progresses.  12. Bipolar disorder: Stable on home regimen of Lamictal, Seroquel and buspar for anxiety 13. ADHD: On Adderall daily.   14.  Perforated gastric ulcer s/p repair 07/2021: On PPI 15. OSA: Nocturnal oxygen in the  past. Will order  prn.  16. Dysphagia/Cough: on D2 nectar thick liquids. Educated patient and husband on aspiration risk and need for nectar liquids.              --  flutter valve added.             --check CXR - 9/29 no acute cardiopulmonary process    LOS: 1 days A FACE TO FACE EVALUATION WAS PERFORMED  Gertie Gowda 12/26/2021, 10:46 AM

## 2021-12-26 NOTE — Progress Notes (Signed)
Attempted to meet with patient. Patient not in room. Board updated.

## 2021-12-26 NOTE — Progress Notes (Signed)
Late note--Plavix added at admission per secure chat with Dr. Leonie Man.

## 2021-12-26 NOTE — Progress Notes (Signed)
Pt refused CPAP

## 2021-12-27 DIAGNOSIS — I63511 Cerebral infarction due to unspecified occlusion or stenosis of right middle cerebral artery: Secondary | ICD-10-CM | POA: Diagnosis not present

## 2021-12-27 MED ORDER — NAPHAZOLINE-GLYCERIN 0.012-0.25 % OP SOLN
1.0000 [drp] | Freq: Four times a day (QID) | OPHTHALMIC | Status: DC | PRN
  Filled 2021-12-27: qty 15

## 2021-12-27 NOTE — Progress Notes (Signed)
Speech Language Pathology Daily Session Note  Patient Details  Name: Lauren [name] MRN: [id number] Date of Birth: 1950-07-28  Today's Date: 12/27/2021 SLP Individual Time: 1020-1120 SLP Individual Time Calculation (min): 60 min  Short Term Goals: Week 1: SLP Short Term Goal 1 (Week 1): Patient will consume current diet with minimal overt s/s of aspiration witth Mod verbal cues for use of swallowing compensatory strategies. SLP Short Term Goal 2 (Week 1): Patient will consume trials of thin liquids via cup with mod verbal cues for use of swallowing compensatory strategies and overt s/s of aspiration in less than 25% of trials. SLP Short Term Goal 3 (Week 1): Patient will attened to left field of enviornment/body during functional tasks with mod verbal cues. SLP Short Term Goal 4 (Week 1): Patient will demonstrate sustained attention to functional tasks for 10 minutes with Mod verbal cues for redirection. SLP Short Term Goal 5 (Week 1): Patient will demonstrate functional problem solving for basic and familiar tasks with Mod verbal cues.  Skilled Therapeutic Interventions:  Pt was seen for skilled ST targeting goals for dysphagia and cognition.  Upon arrival, pt was sitting up in bed, awake, alert, and agreeable to participating in treatment.  Pt requested to use the bathroom and needed mod assist for safety awareness due to impulsivity when preparing to ambulate with rolling walker to bathroom. Once seated back in bed, SLP facilitated the session with trials of thin liquids via small controlled straw sips following oral care.  SLP initially attempted cup sips but pt reported that currently she feels she has better control and containment of boluses via straw.  Pt had x1 delayed cough with trials of thin liquids, no other overt s/s of aspiration with mod verbal and tactile cues for rate and portion control.  For now, recommend that pt remain on currently prescribed diet with trials of thin liquids  with SLP only.  SLP also administered the SLUMS cognitive assessment.  Pt scored 18/26 on assessment (clock drawing task was eliminated due to pt being unable to write due to essential tremor) which likely places her outside of the range of normal limits when taking into account pt's percentage correct in comparison to percentage correct equivalent of a minimum score of 27/30.  Discussed findings with pt and subsequent recommendations to continue addressing cognitive goals while inpatient.  All questions were answered to pt's satisfaction at this time.  Continue per current plan of care.    Pain Pain Assessment Pain Scale: 0-10 Pain Score: 0-No pain  Therapy/Group: Individual Therapy  Dickson Kostelnik, Selinda Orion 12/27/2021, 11:53 AM

## 2021-12-27 NOTE — Progress Notes (Signed)
Occupational Therapy Session Note  Patient Details  Name: Lauren [name] MRN: [id number] Date of Birth: May 22, 1950  Today's Date: 12/27/2021 OT Individual Time: 6659-9357 OT Individual Time Calculation (min): 75 min    Short Term Goals: Week 1:  OT Short Term Goal 1 (Week 1): Patient will use L UE to self feed with min A OT Short Term Goal 2 (Week 1): Patient will complete 2 steps of toileting with min A OT Short Term Goal 3 (Week 1): Patient will locate 2 items on L side of sink with min questioning cues.  Skilled Therapeutic Interventions/Progress Updates:    OT session focused on ADL remediation, transfers, and functional mobility. Pt received sitting EOB eating breakfast with NT in room. OT assisted with breakfast, providing built up spoon and fork to improve grasping skills on utensils and support at elbow to minimize tremors. Pt reported fatigue during meal, however able to self-feed with overall mod A. Pt ambulated bed<>toilet 2x with min A using RW. Completed toileting with min A for balance. Completed dressing sitting on toilet with cues for slowing pace and min A. Returned to bed to complete oral care with min A due to difficulty with grasping and coordination. At end of session, pt left reclined in bed with all needs in reach and husband present.   Therapy Documentation Precautions:  Precautions Precautions: Fall Precaution Comments: watch O2 Restrictions Weight Bearing Restrictions: No General:   Vital Signs:  Pain:   ADL: ADL Eating: Maximal assistance Grooming: Moderate assistance Upper Body Bathing: Minimal assistance Lower Body Bathing: Minimal assistance Upper Body Dressing: Minimal assistance Lower Body Dressing: Maximal assistance Toileting: Moderate assistance Toilet Transfer: Moderate assistance Tub/Shower Transfer: Moderate assistance Vision   Perception    Praxis   Balance   Exercises:   Other Treatments:     Therapy/Group: Individual  Therapy  Duayne Cal 12/27/2021, 11:19 AM

## 2021-12-27 NOTE — Progress Notes (Signed)
Physical Therapy Session Note  Patient Details  Name: Lauren [name] MRN: [id number] Date of Birth: 1950/09/28  Today's Date: 12/27/2021 PT Individual Time: 1525-1619 PT Individual Time Calculation (min): 54 min   Short Term Goals: Week 1:  PT Short Term Goal 1 (Week 1): Pt will perform bed mobility with CGA PT Short Term Goal 2 (Week 1): Pt will perform bed to chair transfer consistently with minA. PT Short Term Goal 3 (Week 1): Pt will ambulate x150' consistently with minA and LRAD.   Skilled Therapeutic Interventions/Progress Updates:   Pt received supine in bed and agreeable to PT. Supine>sit transfer with supervision assist and cues for safety and awareness of the LLE. Sit<>stand with supervision assist with cues from PT for awareness of position of the LUE to improve grasp on RW.   Gait training through rehab unit 256f, 1526fand 12087fith cues for attention to the task and awareness of obstacles on the L side and improved AD management to prevent LOB to the L.   Dynamic balance training to perform lateral reach to obtain bean bags then toss to target x 11 BUE with no UE support through and min assist for safety. Pt then picking up bean bags from floor with 1 UE supported on RW. Min assist throughout for safety and cues for attention to task and LUE to reduce fall risk.   Nustpe BLE/BUE reciprocal movement training x 5 minutes with cues for attention to the LUE and full ROM on the L side intermittently. Pt reported need for urination. Stand pivot transfer to toilet in bathroom with no AD and min assist   Patient returned to room and left [facility] in [facility] in reach and all needs met.            Therapy Documentation Precautions:  Precautions Precautions: Fall Precaution Comments: watch O2 Restrictions Weight Bearing Restrictions: No  Vital Signs: Therapy Vitals Temp: 97.8 F (36.6 C) Pulse Rate: (!) 59 Resp: 17 BP: 103/78 Patient Position (if  appropriate): Sitting Oxygen Therapy SpO2: 99 % O2 Device: Room Air Pain: Pain Assessment Pain Scale: 0-10 Pain Score: 0-No pain Pain Type: Acute pain Pain Location: Head Pain Orientation: Right Pain Descriptors / Indicators: Aching Pain Intervention(s): Medication (See eMAR) Multiple Pain Sites: Yes Mobility:      Therapy/Group: Individual Therapy  AusLorie Phenix30/2023, 5:15 PM

## 2021-12-27 NOTE — Progress Notes (Signed)
PROGRESS NOTE   Subjective/Complaints:  Pt seen and evaluated at bedside this AM. Complaining of blurred vision, dry mouth. Sister also notes increased urinary frequency. Patient also requests results of recent MRIs and CXR as "I feel like I have something in my lungs". Reviewed results including no acute process; patient endorsed relief.   Afternoon nursing reported patient had unwitnessed fall OOB when trying to reach over the side to grab something. Patient reports no head strike, no LOC, fell on her back. After speaking with family, do feel patient is increasingly impulsive since her last 2 strokes, now calling far away family members asking for visitation and having decreased self-awareness overall. They are concerned about leaving her alone and have tried to keep a family member with her as much as possible.  ROS: Denies fevers, chills, N/V, abdominal pain, constipation, diarrhea, SOB, cough, chest pain, new weakness or paraesthesias.    Objective:   DG Chest 2 View  Result Date: 12/26/2021 CLINICAL DATA:  Cough EXAM: CHEST - 2 VIEW COMPARISON:  December 21, 2021. FINDINGS: The heart size and mediastinal contours are within normal limits. Aortic atherosclerosis. Coronary artery calcifications. No focal airspace consolidation. Chronic bronchitic lung changes with pulmonary hyperinflation and emphysema. Similar left upper lobe pulmonary nodule. The visualized skeletal structures are unremarkable. IMPRESSION: Similar chronic lung changes without acute cardiopulmonary disease. Electronically Signed   By: Dahlia Bailiff M.D.   On: 12/26/2021 09:17   Recent Labs    12/25/21 0551 12/26/21 0631  WBC 10.8* 8.3  HGB 13.2 13.7  HCT 40.1 42.8  PLT 385 396    Recent Labs    12/25/21 0551 12/26/21 0631  NA 140 140  K 3.9 4.2  CL 105 105  CO2 26 30  GLUCOSE 103* 86  BUN 11 13  CREATININE 0.43* 0.61  CALCIUM 8.9 8.9      Intake/Output Summary (Last 24 hours) at 12/27/2021 2032 Last data filed at 12/27/2021 1448 Gross per 24 hour  Intake 356 ml  Output --  Net 356 ml         Physical Exam: Vital Signs Blood pressure 97/63, pulse 65, temperature 98.5 F (36.9 C), resp. rate 17, height 5' (1.524 m), weight 42.7 kg, SpO2 92 %. Constitutional: No apparent distress. Appropriate appearance for age.  HENT: No JVD. Neck Supple. Trachea midline. Atraumatic, normocephalic. Eyes: PERRLA. EOMI. Visual fields grossly intact.  Cardiovascular: RRR, no murmurs/rub/gallops. No Edema. Peripheral pulses 2+  Respiratory: CTAB. No rales, rhonchi, or wheezing. On RA.  Abdomen: + bowel sounds, normoactive. No distention or tenderness.  Skin: Mild DTI over sacrum, without skin disruption or drainage. +sacral heart.   MSK:      No apparent deformity. No ttp over axial spine, paraspinals, or ribs.       Strength: Moving all 4 limbs antigravity and against resistance. From prior exam:  Neurologic exam:  Cognition: AAO to person, place, time and event. + L inattention,  Language: Fluent, No substitutions or neoglisms.  Mood: Mildly pressured speech and impulsivity, ?manic.  CN: +L facial droop  MSK: Moving all 4 limbs antigravity   PE from prior encounters: RUE 5/5 RLE- HF 4+/5; KE 5-/5;  DF and PF 5/5 LUE- Biceps 4/5; Triceps 4/5; WE 4+/5; Grip 4+/5; and FA 3+/5 LLE- HF 4/5; KE 4+/5; DF/PF 4+/5        Assessment/Plan: 1. Functional deficits which require 3+ hours per day of interdisciplinary therapy in a comprehensive inpatient rehab setting. Physiatrist is providing close team supervision and 24 hour management of active medical problems listed below. Physiatrist and rehab team continue to assess barriers to discharge/monitor patient progress toward functional and medical goals  Care Tool:  Bathing    Body parts bathed by patient: Right arm, Left arm, Chest, Abdomen, Front perineal area, Buttocks,  Right upper leg, Left upper leg, Face   Body parts bathed by helper: Right lower leg, Left lower leg     Bathing assist Assist Level: Minimal Assistance - Patient > 75%     Upper Body Dressing/Undressing Upper body dressing   What is the patient wearing?: Pull over shirt    Upper body assist Assist Level: Minimal Assistance - Patient > 75%    Lower Body Dressing/Undressing Lower body dressing      What is the patient wearing?: Underwear/pull up, Pants     Lower body assist Assist for lower body dressing: Minimal Assistance - Patient > 75%     Toileting Toileting    Toileting assist Assist for toileting: Minimal Assistance - Patient > 75%     Transfers Chair/bed transfer  Transfers assist     Chair/bed transfer assist level: Minimal Assistance - Patient > 75%     Locomotion Ambulation   Ambulation assist      Assist level: Maximal Assistance - Patient 25 - 49% Assistive device: No Device Max distance: 150'   Walk 10 feet activity   Assist     Assist level: Maximal Assistance - Patient 25 - 49% Assistive device: No Device   Walk 50 feet activity   Assist    Assist level: Maximal Assistance - Patient 25 - 49% Assistive device: No Device    Walk 150 feet activity   Assist    Assist level: Maximal Assistance - Patient 25 - 49% Assistive device: No Device    Walk 10 feet on uneven surface  activity   Assist     Assist level: Moderate Assistance - Patient - 50 - 74% Assistive device:  (R handrail)   Wheelchair     Assist Is the patient using a wheelchair?: Yes Type of Wheelchair: Manual    Wheelchair assist level: Total Assistance - Patient < 25% Max wheelchair distance: 150'    Wheelchair 50 feet with 2 turns activity    Assist        Assist Level: Total Assistance - Patient < 25%   Wheelchair 150 feet activity     Assist      Assist Level: Total Assistance - Patient < 25%   Blood pressure 97/63,  pulse 65, temperature 98.5 F (36.9 C), resp. rate 17, height 5' (1.524 m), weight 42.7 kg, SpO2 92 %.  Medical Problem List and Plan: 1. Functional deficits secondary to R MCA stroke with L sensory deficits and L hemiparesis as well as dysphagia and L inattention             -patient may  shower             -ELOS/Goals: 10-14 days- supervision maybe min A due to sensory deficits 2.  Antithrombotics: -DVT/anticoagulation:  Pharmaceutical: Lovenox             -antiplatelet  therapy: ASA. Will contact Neuro re: addition of Plavix - ?.  3. DDD Cervical spine/Pain Management: tylenol prn.   4. Mood/Behavior/Sleep:  LCSW to follow for evaluation and support.              -antipsychotic agents:  Continue              - Consider neuropsych consult next week if appropriate            - 9/30: Fall OOB, noted impulsive behaviors and poor safety awareness, however no further tele available in hospital; nursing to place belt alarm in bed  5. Neuropsych/cognition: This patient may be intermittently capable of making decisions on her own behalf. 6. Skin/Wound Care: Routine pressure relief measures.           - Sacral DTI - Sacral heart, change PRN; Per nursing, now blancheable, no concern for stage 1 7. Fluids/Electrolytes/Nutrition: Monitor I/O. Check CMET in am. 8. HTN: Monitor BP TID 9. CAD: Monitor for symptoms with increase in activity. Continue ASA, Lipitor 10. 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  --Stable on Breztri PTA and husband provided from home.  11. Chronic tremors: Managed with inderal and Lamictal--worse in am and gets better as day progresses.  12. Bipolar disorder: Stable on home regimen of Lamictal, Seroquel and buspar for anxiety 13. ADHD: On Adderall daily.   14.  Perforated gastric ulcer s/p repair 07/2021: On PPI 15. OSA: Nocturnal oxygen in the  past. Will order prn.  16. Dysphagia/Cough: on D2 nectar thick liquids.  Educated patient and husband on aspiration risk and need for nectar liquids.              --flutter valve added.             --check CXR - 9/29 no acute cardiopulmonary process 17. Blurred vision - added PRN eye drops. Encouraged work with therapies. Can consider OP neuroptho eval if persistent.    LOS: 2 days A FACE TO [place] 12/27/2021, 8:32 PM

## 2021-12-27 NOTE — Progress Notes (Signed)
Pt stated she was unable to tolerate CPAP last night. Pt doesn't want to wear CPAP for the night.

## 2021-12-27 NOTE — Progress Notes (Signed)
   12/27/21 1448  What Happened  Was fall witnessed? No  Was patient injured? No  Patient found on floor  Found by Staff-comment (Destiny latham NT)  Stated prior activity to/from bed, chair, or stretcher  Follow Up  MD notified Dr. Tressa Busman  Time MD notified 1455  Family notified Yes - comment  Time family notified 1455  Additional tests No  Progress note created (see row info) Yes  Adult Fall Risk Assessment  Risk Factor Category (scoring not indicated) Fall has occurred during this admission (document High fall risk)  Age 71  Fall History: Fall within 6 months prior to admission 5  Elimination; Bowel and/or Urine Incontinence 0  Elimination; Bowel and/or Urine Urgency/Frequency 0  Medications: includes PCA/Opiates, Anti-convulsants, Anti-hypertensives, Diuretics, Hypnotics, Laxatives, Sedatives, and Psychotropics 5  Patient Care Equipment 0  Mobility-Assistance 2  Mobility-Gait 2  Mobility-Sensory Deficit 0  Altered awareness of immediate physical environment 0  Impulsiveness 0  Lack of understanding of one's physical/cognitive limitations 0  Total Score 15  Patient Fall Risk Level High fall risk  Adult Fall Risk Interventions  Required Bundle Interventions *See Row Information* High fall risk - low, moderate, and high requirements implemented  Additional Interventions PT/OT need assessed if change in mobility from baseline  Screening for Fall Injury Risk (To be completed on HIGH fall risk patients) - Assessing Need for Floor Mats  Risk For Fall Injury- Criteria for Floor Mats None identified - No additional interventions needed  Will Implement Floor Mats Yes  Vitals  Temp 97.9 F (36.6 C)  BP (!) 155/82  MAP (mmHg) 105  BP Location Right Arm  BP Method Automatic  Patient Position (if appropriate) Sitting  Pulse Rate 70  Pulse Rate Source Monitor  Resp 18  Oxygen Therapy  SpO2 96 %  O2 Device Room Air  Pain Assessment  Pain Scale 0-10  Pain Score 0  Pain Type  Acute pain  Pain Location Head  Pain Orientation Right  Pain Descriptors / Indicators Aching  Pain Intervention(s) Medication (See eMAR)  Multiple Pain Sites Yes  PCA/Epidural/Spinal Assessment  Respiratory Pattern Regular;Unlabored  Neurological  Neuro (WDL) WDL  LUE Motor Response Purposeful movement  LUE Sensation Decreased  LUE Motor Strength 4  LLE Motor Response Non-purposeful movement  LLE Sensation Decreased  LLE Motor Strength 3  Neuro Symptoms Anxiety;Forgetful  Neuro symptoms relieved by Rest  Reflexes  Gag Present  Cough Present  Musculoskeletal  Musculoskeletal (WDL) X  Assistive Device Front wheel walker  Generalized Weakness Yes  Weight Bearing Restrictions No  Integumentary  Integumentary (WDL) WDL

## 2021-12-28 NOTE — Progress Notes (Signed)
Pt placed on CPAP auto mode.

## 2021-12-28 NOTE — IPOC Note (Signed)
Overall Plan of Care [facility]) Patient Details Name: Lauren [name] MRN: [id number] DOB: Jul 29, 1950  Admitting Diagnosis: Acute ischemic right MCA stroke [facility])  Hospital Problems: Principal Problem:   Acute ischemic right MCA stroke [facility])     Functional Problem List: Nursing Bladder, Bowel, Pain, Safety, Edema, Endurance, Sensory, Medication Management, Skin Integrity, Motor  PT Balance, Behavior, Endurance, Motor, Pain, Perception, Safety, Skin Integrity  OT Balance, Behavior, Cognition, Edema, Endurance, Motor, Nutrition, Pain, Perception, Safety, Sensory, Skin Integrity, Vision  SLP Cognition, Safety, Behavior, Nutrition  TR         Basic ADL's: OT Dressing, Toileting, Grooming, Eating, Bathing     Advanced  ADL's: OT       Transfers: PT Bed Mobility, Bed to Chair, Car, Patent attorney, Agricultural engineer: PT Ambulation, Stairs     Additional Impairments: OT Fuctional Use of Upper Extremity  SLP Swallowing, Communication, Social Cognition expression Social Interaction, Problem Solving, Memory, Awareness, Attention  TR      Anticipated Outcomes Item Anticipated Outcome  Self Feeding Supervision  Swallowing  Min A   Basic self-care  Supervision  Toileting  Supervision   Bathroom Transfers Supervision  Bowel/Bladder  continent x 2 LBM PTA  Transfers  Supervision  Locomotion  Supervision  Communication  Min A  Cognition  Min A  Pain  less than 3  Safety/Judgment  remain fall free while in rehab   Therapy Plan: PT Intensity: Minimum of 1-2 x/day ,45 to 90 minutes PT Frequency: 5 out of 7 days PT Duration Estimated Length of Stay: 2.5 weeks OT Intensity: Minimum of 1-2 x/day, 45 to 90 minutes OT Frequency: 5 out of 7 days OT Duration/Estimated Length of Stay: 2 weeks SLP Intensity: Minumum of 1-2 x/day, 30 to 90 minutes SLP Frequency: 3 to 5 out of 7 days SLP Duration/Estimated Length of Stay: 2 weeks   Team Interventions: Nursing  Interventions Patient/Family Education, Disease Management/Prevention, Skin Care/Wound Management, Discharge Planning, Bladder Management, Pain Management, Cognitive Remediation/Compensation, Psychosocial Support, Bowel Management, Medication Management, Dysphagia/Aspiration Precaution Training  PT interventions Ambulation/gait training, Cognitive remediation/compensation, Discharge planning, DME/adaptive equipment instruction, Functional mobility training, Pain management, Psychosocial support, Splinting/orthotics, Therapeutic Activities, UE/LE Strength taining/ROM, Training and development officer, Community reintegration, Disease management/prevention, Functional electrical stimulation, Neuromuscular re-education, Patient/family education, Skin care/wound management, Stair training, Therapeutic Exercise, UE/LE Coordination activities, Wheelchair propulsion/positioning, Visual/perceptual remediation/compensation  OT Interventions Training and development officer, Cognitive remediation/compensation, Academic librarian, Discharge planning, Disease mangement/prevention, DME/adaptive equipment instruction, Functional electrical stimulation, Functional mobility training, Neuromuscular re-education, Pain management, Patient/family education, Psychosocial support, Self Care/advanced ADL retraining, Skin care/wound managment, Splinting/orthotics, Therapeutic Activities, Therapeutic Exercise, UE/LE Strength taining/ROM, UE/LE Coordination activities, Visual/perceptual remediation/compensation, Wheelchair propulsion/positioning  SLP Interventions Cognitive remediation/compensation, Dysphagia/aspiration precaution training, Internal/external aids, Speech/Language facilitation, Therapeutic Activities, Environmental controls, Cueing hierarchy, Functional tasks, Patient/family education  TR Interventions    SW/CM Interventions Discharge Planning, Psychosocial Support, Patient/Family Education   Barriers to Discharge MD   Medical stability, Home enviroment access/loayout, Incontinence, Lack of/limited family support, Insurance for SNF coverage, Medication compliance, Behavior, and Nutritional means  Nursing Decreased caregiver support, Home environment access/layout, Incontinence, Wound Care house 2 level with B/B on main level alt entrace thu carport with 1 ste no rails  PT Behavior Impulsivity and lack of safety awareness  OT      SLP      SW       Team Discharge Planning: Destination: PT-Home ,OT- Home , SLP-Home Projected Follow-up: PT-Home health PT, 24 hour supervision/assistance, OT-  Outpatient  OT, 24 hour supervision/assistance, SLP-24 hour supervision/assistance, Home Health SLP, Outpatient SLP Projected Equipment Needs: PT-To be determined, OT- To be determined, SLP-To be determined Equipment Details: PT- , OT-  Patient/family involved in discharge planning: PT- Patient, Family member/caregiver,  OT-Patient, SLP-Patient, Family member/caregiver  MD ELOS: 10-14 days Medical Rehab Prognosis:  Good Assessment: The patient has been admitted for CIR therapies with the diagnosis of R M1 MCA stroke. The team will be addressing functional mobility, strength, stamina, balance, safety, adaptive techniques and equipment, self-care, bowel and bladder mgt, patient and caregiver education, unsafe behaviors, and aphasia. Goals have been set at [facility]. Anticipated discharge destination is home with boyfriend.     Gertie Gowda, DO 12/28/2021    See Team Conference Notes for weekly updates to the plan of care

## 2021-12-29 ENCOUNTER — Encounter (HOSPITAL_COMMUNITY): Payer: Self-pay | Admitting: Physical Medicine and Rehabilitation

## 2021-12-29 DIAGNOSIS — I63511 Cerebral infarction due to unspecified occlusion or stenosis of right middle cerebral artery: Secondary | ICD-10-CM | POA: Diagnosis not present

## 2021-12-29 DIAGNOSIS — I10 Essential (primary) hypertension: Secondary | ICD-10-CM | POA: Diagnosis not present

## 2021-12-29 DIAGNOSIS — I25810 Atherosclerosis of coronary artery bypass graft(s) without angina pectoris: Secondary | ICD-10-CM

## 2021-12-29 DIAGNOSIS — R1310 Dysphagia, unspecified: Secondary | ICD-10-CM | POA: Diagnosis not present

## 2021-12-29 LAB — BASIC METABOLIC PANEL
Anion gap: 7 (ref 5–15)
BUN: 13 mg/dL (ref 8–23)
CO2: 28 mmol/L (ref 22–32)
Calcium: 8.8 mg/dL — ABNORMAL LOW (ref 8.9–10.3)
Chloride: 105 mmol/L (ref 98–111)
Creatinine, Ser: 0.62 mg/dL (ref 0.44–1.00)
GFR, Estimated: >60 mL/min (ref >60)
Glucose, Bld: 87 mg/dL (ref 70–99)
Potassium: 3.8 mmol/L (ref 3.5–5.1)
Sodium: 140 mmol/L (ref 135–145)

## 2021-12-29 LAB — CBC
HCT: 42.7 % (ref 36.0–46.0)
Hemoglobin: 13.8 g/dL (ref 12.0–15.0)
MCH: 28.9 pg (ref 26.0–34.0)
MCHC: 32.3 g/dL (ref 30.0–36.0)
MCV: 89.5 fL (ref 80.0–100.0)
Platelets: 548 10*3/uL — ABNORMAL HIGH (ref 150–400)
RBC: 4.77 MIL/uL (ref 3.87–5.11)
RDW: 13.4 % (ref 11.5–15.5)
WBC: 8.1 10*3/uL (ref 4.0–10.5)
nRBC: 0 % (ref 0.0–0.2)

## 2021-12-29 NOTE — Progress Notes (Signed)
Inpatient Rehabilitation Care Coordinator Assessment and Plan Patient Details  Name: Lauren [name] MRN: [id number] Date of Birth: 20-Feb-1951  Today's Date: 12/29/2021  Hospital Problems: Principal Problem:   Acute ischemic right MCA stroke [facility])  Past Medical History:  Past Medical History:  Diagnosis Date   Anxiety    Bipolar 1 disorder ([place])    Bipolar disorder ([place])    Breast cancer ([place])    Breast cancer ([place])    Bronchitis    CAD (coronary artery disease)    DDD (degenerative disc disease), cervical    Diabetes mellitus without complication ([place])    Emphysema lung ([place])    Hypertension    ICH (intracerebral hemorrhage) ([place])    Lung cancer ([place])    Occasional tremors    Parotid mass    PUD (peptic ulcer disease)    Past Surgical History:  Past Surgical History:  Procedure Laterality Date   BREAST LUMPECTOMY     CORONARY ANGIOPLASTY WITH STENT PLACEMENT     GASTRIC BYPASS  2016   KNEE ARTHROSCOPY     REPAIR OF PERFORATED ULCER  07/2021   Social History:  reports that she has quit smoking. Her smoking use included cigarettes. She has never used smokeless tobacco. She reports that she does not currently use alcohol. She reports that she does not currently use drugs.  Family / Support Systems Patient Roles: Spouse Spouse/Significant Other: Shanon Brow Children: Melina Copa Anticipated Caregiver: spouse Ability/Limitations of Caregiver: none Caregiver Availability: 24/7 Family Dynamics: support from spouse and daughter  Social History Preferred language: English Religion: Unknown Health Literacy - How often do you need to have someone help you when you read instructions, pamphlets, or other written material from your doctor or pharmacy?: Never   Abuse/Neglect Abuse/Neglect Assessment Can Be Completed: Yes Physical Abuse: Denies Verbal Abuse: Denies Sexual Abuse: Denies Exploitation of patient/patient's resources: Denies Self-Neglect: Denies  Patient response  to: Social Isolation - How often do you feel lonely or isolated from those around you?: Never  Emotional Status Recent Psychosocial Issues: coping Psychiatric History: n/a Substance Abuse History: n/a  Patient / Family Perceptions, Expectations & Goals Pt/Family understanding of illness & functional limitations: yes Premorbid pt/family roles/activities: MOD I with Cane Anticipated changes in roles/activities/participation: spouse able to assist with roles and tasks at discharge Pt/family expectations/goals: supervision  [employer]: None Premorbid Home Care/DME Agencies: Other (Comment) ([facility], [place], [place]) Transportation available at discharge: spouse Is the patient able to respond to transportation needs?: Yes In the past 12 months, has lack of transportation kept you from medical appointments or from getting medications?: No In the past 12 months, has lack of transportation kept you from meetings, work, or from getting things needed for daily living?: No  Discharge Planning Living Arrangements: Spouse/significant other Support Systems: Spouse/significant other Type of Residence: Private residence Insurance Resources: [employer], Multimedia programmer (specify) (Fed Physicist, medical) Museum/gallery curator Resources: Employment Financial Screen Referred: No Living Expenses: Own Money Management: Patient, Spouse Does the patient have any problems obtaining your medications?: No Home Management: Independent Patient/Family Preliminary Plans: plans to manage at discharge, spouse able to assist if needed Care Coordinator Barriers to Discharge: Lack of/limited family support, Decreased caregiver support, New oxygen Care Coordinator Anticipated Follow Up Needs: [place] Additional Notes/Comments: Full bathroom in basement, bathroom on mainlevel being renovated. Plans to stay on main level Expected length of stay: 10-12 Days  Clinical Impression Covering for primary  Sw, [place].  Sw met with patient, spouse and family  member introduced self and explained role. Family informed primary Sw will provide conference updates, No additional questions or concerns.  Dyanne Iha 12/29/2021, 12:33 PM

## 2021-12-29 NOTE — Progress Notes (Signed)
Speech Language Pathology Daily Session Note  Patient Details  Name: Lauren [name] MRN: [id number] Date of Birth: 12/11/1950  Today's Date: 12/29/2021 SLP Individual Time: 1405-1504 SLP Individual Time Calculation (min): 59 min  Short Term Goals: Week 1: SLP Short Term Goal 1 (Week 1): Patient will consume current diet with minimal overt s/s of aspiration witth Mod verbal cues for use of swallowing compensatory strategies. SLP Short Term Goal 2 (Week 1): Patient will consume trials of thin liquids via cup with mod verbal cues for use of swallowing compensatory strategies and overt s/s of aspiration in less than 25% of trials. SLP Short Term Goal 3 (Week 1): Patient will attened to left field of enviornment/body during functional tasks with mod verbal cues. SLP Short Term Goal 4 (Week 1): Patient will demonstrate sustained attention to functional tasks for 10 minutes with Mod verbal cues for redirection. SLP Short Term Goal 5 (Week 1): Patient will demonstrate functional problem solving for basic and familiar tasks with Mod verbal cues.  Skilled Therapeutic Interventions: Skilled treatment session focused on cognitive goals. Upon arrival, patient was awake while upright in the wheelchair with her family present. Patient's husband requested additional thickening packets and had questions regarding protein shakes. SLP provided education regarding adding additional protein with magic cups and advised that protein shakes would be appropriate from a texture standpoint as long as they are thickened appropriately to a nectar-thick consistency. He verbalized understanding. Patient requested to use the bathroom and required Min verbal cues for safety with task due to impulsivity. Patient was continent of bladder. SLP facilitated session by providing extra time and overall Mod A verbal and visual cues for organization, working memory and functional problem solving during a basic money management task. Patient  reports baseline memory deficits in which she utilized external aids for compensation. However, patient is unable to utilize notes at this time due to LUE weakness. Spoke to patient's husband regarding writing pertinent, daily information down to maximize recall and carryover as patient reports frustration with current memory deficits. Patient verbalized understanding and agreement. Overall, patient with decreased impulsivity, improved sustained attention and improved emergent awareness regarding current deficits and their impact on her current overall function. Patient left upright in wheelchair with husband present. Continue with current plan of care.      Pain No/Denies Pain   Therapy/Group: Individual Therapy  Arick Mareno 12/29/2021, 3:15 PM

## 2021-12-29 NOTE — Progress Notes (Addendum)
PROGRESS NOTE   Subjective/Complaints: Pt working with OT this AM. Reports she enjoyed using grounds pass yesterday.  No new concerns this AM. She doesn't like restrictions on activities when she is alone, we discussed how this is for safety and fall prevention.   ROS: Denies fevers, HA, hills, N/V, abdominal pain, constipation, diarrhea, SOB, cough, chest pain, new weakness or paraesthesias.    Objective:   No results found. Recent Labs    12/29/21 0655  WBC 8.1  HGB 13.8  HCT 42.7  PLT 548*    No results for input(s): "NA", "K", "CL", "CO2", "GLUCOSE", "BUN", "CREATININE", "CALCIUM" in the last 72 hours.   Intake/Output Summary (Last 24 hours) at 12/29/2021 0808 Last data filed at 12/28/2021 1458 Gross per 24 hour  Intake 177 ml  Output --  Net 177 ml         Physical Exam: Vital Signs Blood pressure 109/69, pulse 70, temperature 97.7 F (36.5 C), temperature source Oral, resp. rate 18, height 5' (1.524 m), weight 42.7 kg, SpO2 94 %. Constitutional: No apparent distress. Appropriate appearance for age.  HENT: No JVD. Neck Supple. Trachea midline. Atraumatic, normocephalic. Eyes: PERRLA. EOMI. Visual fields grossly intact.  Cardiovascular: RRR, no murmurs/rub/gallops. No Edema. Peripheral pulses 2+  Respiratory: CTAB. No rales, rhonchi, or wheezing. On RA. Good air movement. Normal rate Abdomen: + bowel sounds, normoactive. No distention or tenderness.  Skin: Mild DTI over sacrum, without skin disruption or drainage. +sacral heart.   MSK:      No apparent deformity. No ttp over axial spine, paraspinals, or ribs.       Strength: Moving all 4 limbs antigravity and against resistance. From prior exam:  Neurologic exam:  Cognition: AAO to person, place, time and event. + L inattention,  Language: Fluent, No substitutions or neoglisms.  Mood: Mildly pressured speech and impulsivity, ?manic.  CN: +L facial droop   MSK: Moving all 4 limbs antigravity   PE from prior encounters: RUE 5/5 RLE- HF 4+/5; KE 5-/5; DF and PF 5/5 LUE- Biceps 4/5; Triceps 4/5; WE 4+/5; Grip 4+/5; and FA 3+/5 LLE- HF 4/5; KE 4+/5; DF/PF 4+/5        Assessment/Plan: 1. Functional deficits which require 3+ hours per day of interdisciplinary therapy in a comprehensive inpatient rehab setting. Physiatrist is providing close team supervision and 24 hour management of active medical problems listed below. Physiatrist and rehab team continue to assess barriers to discharge/monitor patient progress toward functional and medical goals  Care Tool:  Bathing    Body parts bathed by patient: Right arm, Left arm, Chest, Abdomen, Front perineal area, Buttocks, Right upper leg, Left upper leg, Face   Body parts bathed by helper: Right lower leg, Left lower leg     Bathing assist Assist Level: Minimal Assistance - Patient > 75%     Upper Body Dressing/Undressing Upper body dressing   What is the patient wearing?: Pull over shirt    Upper body assist Assist Level: Minimal Assistance - Patient > 75%    Lower Body Dressing/Undressing Lower body dressing      What is the patient wearing?: Underwear/pull up, Pants  Lower body assist Assist for lower body dressing: Minimal Assistance - Patient > 75%     Toileting Toileting    Toileting assist Assist for toileting: Minimal Assistance - Patient > 75%     Transfers Chair/bed transfer  Transfers assist     Chair/bed transfer assist level: Minimal Assistance - Patient > 75%     Locomotion Ambulation   Ambulation assist      Assist level: Maximal Assistance - Patient 25 - 49% Assistive device: No Device Max distance: 150'   Walk 10 feet activity   Assist     Assist level: Maximal Assistance - Patient 25 - 49% Assistive device: No Device   Walk 50 feet activity   Assist    Assist level: Maximal Assistance - Patient 25 - 49% Assistive device:  No Device    Walk 150 feet activity   Assist    Assist level: Maximal Assistance - Patient 25 - 49% Assistive device: No Device    Walk 10 feet on uneven surface  activity   Assist     Assist level: Moderate Assistance - Patient - 50 - 74% Assistive device:  (R handrail)   Wheelchair     Assist Is the patient using a wheelchair?: Yes Type of Wheelchair: Manual    Wheelchair assist level: Total Assistance - Patient < 25% Max wheelchair distance: 150'    Wheelchair 50 feet with 2 turns activity    Assist        Assist Level: Total Assistance - Patient < 25%   Wheelchair 150 feet activity     Assist      Assist Level: Total Assistance - Patient < 25%   Blood pressure 109/69, pulse 70, temperature 97.7 F (36.5 C), temperature source Oral, resp. rate 18, height 5' (1.524 m), weight 42.7 kg, SpO2 94 %.  Medical Problem List and Plan: 1. Functional deficits secondary to R MCA stroke with L sensory deficits and L hemiparesis as well as dysphagia and L inattention             -patient may  shower             -ELOS/Goals: 10-14 days- supervision maybe min A due to sensory deficits  -Continue CIR with PT/OT/SLP 2.  Antithrombotics: -DVT/anticoagulation:  Pharmaceutical: Lovenox             -antiplatelet therapy: ASA. Will contact Neuro re: addition of Plavix - ?.  3. DDD Cervical spine/Pain Management: tylenol prn.   4. Mood/Behavior/Sleep:  LCSW to follow for evaluation and support.              -antipsychotic agents:  Continue              - Consider neuropsych consult next week if appropriate            - 9/30: Fall OOB, noted impulsive behaviors and poor safety awareness, however no further tele available in hospital; nursing to place belt alarm in bed  5. Neuropsych/cognition: This patient may be intermittently capable of making decisions on her own behalf. 6. Skin/Wound Care: Routine pressure relief measures.           - Sacral DTI - Sacral  heart, change PRN; Per nursing, now blancheable, no concern for stage 1 7. Fluids/Electrolytes/Nutrition: Monitor I/O. Check CMET in am. 8. HTN: Monitor BP TID, well controlled 9. CAD: Monitor for symptoms with increase in activity. Continue ASA, Lipitor. Denies chest pain 10. NSCL CA s/p SBRT/COPD w/ongoing  tobacco use: Tobacco cessation/stroke risk factor reviewed.  --Stable on Breztri PTA and husband provided from home.  11. Chronic tremors: Managed with inderal and Lamictal--worse in am and gets better as day progresses.  12. Bipolar disorder: Stable on home regimen of Lamictal, Seroquel and buspar for anxiety 13. ADHD: On Adderall daily.   14.  Perforated gastric ulcer s/p repair 07/2021: On PPI 15. OSA: Nocturnal oxygen in the  past. Will order prn.  16. Dysphagia/Cough: on D2 nectar thick liquids. Educated patient and husband on aspiration risk and need for nectar liquids.              --flutter valve added.             --check CXR - 9/29 no acute cardiopulmonary process  -Upgraded to DYS 3 diet 9/29 17. Blurred vision - added PRN eye drops. Encouraged work with therapies. Can consider OP neuroptho eval if persistent.    LOS: 4 days A FACE TO FACE EVALUATION WAS PERFORMED  Jennye Boroughs 12/29/2021, 8:08 AM

## 2021-12-29 NOTE — Progress Notes (Signed)
Occupational Therapy Session Note  Patient Details  Name: Lauren [name] MRN: [id number] Date of Birth: 09-28-1950  Today's Date: 12/29/2021 OT Individual Time: 5749-3552 OT Individual Time Calculation (min): 71 min    Short Term Goals: Week 1:  OT Short Term Goal 1 (Week 1): Patient will use L UE to self feed with min A OT Short Term Goal 2 (Week 1): Patient will complete 2 steps of toileting with min A OT Short Term Goal 3 (Week 1): Patient will locate 2 items on L side of sink with min questioning cues.  Skilled Therapeutic Interventions/Progress Updates:    Pt greeted sitting upright in bed eating breakfast, handoff from nurse tech. Worked on self feeding strategies using built up handles and cues for coordination and strategy to get food bolus to mouth using L hand. Also worked on L visual scanning and using L hand to steady containers. Pt reported need to go to the bathroom. Bed mobility with supervision, then CGA ambulation to bathroom with RW and verbal cues for safety awareness. Pt able to manage clothing and had successful BM and voided bladder. Standing peri-care with CGA. Pt often would forget what she was doing with L hand and forgot she had toilet paper still in L hand that needed to be dropped in toilet. Bathing tasks with min cues to integrate L hand into bathing tasks. Dressing seated in wc with CGA for balance and min cues for hemi dressing technique. Functional ambulation to the sink without AD and Min A. Standing balance./endurance and L fine motor coordination with standing grooming tasks. Forced use of L hand within grooming tasks. Pt left seated in wc with nursing present to administer meds.  Therapy Documentation Precautions:  Precautions Precautions: Fall Precaution Comments: watch O2 Restrictions Weight Bearing Restrictions: No   Pain:  Denies pain  Therapy/Group: Individual Therapy  Valma Cava 12/29/2021, 8:33 AM

## 2021-12-29 NOTE — Progress Notes (Signed)
Physical Therapy Session Note  Patient Details  Name: Lauren [name] MRN: [id number] Date of Birth: 09/29/1950  Today's Date: 12/29/2021 PT Individual Time: 7035-0093 and 1302-1330 PT Individual Time Calculation (min): 30 min and 28 min  Short Term Goals: Week 1:  PT Short Term Goal 1 (Week 1): Pt will perform bed mobility with CGA PT Short Term Goal 2 (Week 1): Pt will perform bed to chair transfer consistently with minA. PT Short Term Goal 3 (Week 1): Pt will ambulate x150' consistently with minA and LRAD.  Skilled Therapeutic Interventions/Progress Updates:     1st Session: Pt received supine in bed and agrees to therapy. No complaint of pain. Pt performs supine to sit with verbal cues for positioning at EOB. Pt stands without warning prior to donning shoes. When questioned, pt states "I'm going to the bathroom." PT redirects pt to sit back down for safety and pt dons shoes with setup assistance. Following, pt performs ambulatory transfer to toilet with minA and cues for sequencing and positioning. Pt has significantly decreased eccentric control of stand to sit. Following, pt ambulates to sink and washes hands with CGA, WC transport to gym. Pt ambulates x200' without AD. PT cues to ambulates "as carefully as possible" and to pay close attention to L lower extremity to ensure that it does not drag. PT provides CGA for much of gait distance, but pt has several instances of dragging L foot, requiring minA for safety, and then has complete LOB to the L, requiring totalA to prevent fall. Pt appears to lose balance due to becoming distracted by objects in R visual field, through pt states that it was because legs "got weak." Following seated rest break, pt attempts alternating toe taps on orange cone to work on dynamic standing balance, lateral weight transitions, and coordination. Pt requires minA for stability and frequently misses cone or kicks over cone. WC transport back to room. Stand step  transfer to bed with CGA ad cues for positioning. Left supine with alarm intact and all needs within reach.  2nd Session: Pt received seated in [facility] and agrees to therapy. No complaint of pain. WC transport to gym for time management. Pt performs stand pivot transfer to mat table with CGA and cues for sequencing. Pt then performs 3x5 reps of sit to stand, using hands for transition from sit tot stand and then sitting without use of hands to encourage increased eccentric control. CGA required and cues for body mechanics. Following rest break, pt ambulates x175' with minA overall and x1 instance of maxA due to catching L foot and losing balance anteriorly. Following seated rest break, pt ambulates additional x175', again with minA and cues at trunk for upright posture, as well as providing assistance with lateral weight shifting for symmetrical reciprocal gait pattern. Pt requires minA overall but has difficulty sequencing safe transition back to WC, requiring maxA due to losing balance anteriorly into WC. WC transport back to room. Pt left seated with all needs within each.  Therapy Documentation Precautions:  Precautions Precautions: Fall Precaution Comments: watch O2 Restrictions Weight Bearing Restrictions: No   Therapy/Group: Individual Therapy  Breck Coons, PT, DPT 12/29/2021, 4:50 PM

## 2021-12-29 NOTE — Progress Notes (Signed)
Rt visited with pt about cpap and pt stated she had a sleep study done 12 years ago and they told her she did not have sleep apnea (OSA).  She also stated she was almost 300lbs at this time.  After talking with pt, she wants to hold off on cpap for tonight. RN present for conversation.  RT told pt the machine will be at beside in case she may need it but to speak with MD about d/c'ing cpap as she does not wear or have one at home.  RT will cont to monitor.

## 2021-12-29 NOTE — Progress Notes (Signed)
[facility] Individual Statement of Services  Patient Name:  Lauren [name]  Date:  12/29/2021  Welcome to the [facility].  Our goal is to provide you with an individualized program based on your diagnosis and situation, designed to meet your specific needs.  With this comprehensive rehabilitation program, you will be expected to participate in at least 3 hours of rehabilitation therapies Monday-Friday, with modified therapy programming on the weekends.  Your rehabilitation program will include the following services:  Physical Therapy (PT), Occupational Therapy (OT), Speech Therapy (ST), 24 hour per day rehabilitation nursing, Therapeutic Recreaction (TR), Neuropsychology, Care Coordinator, Rehabilitation Medicine, Nutrition Services, Pharmacy Services, and Other  Weekly team conferences will be held on Tuesdays to discuss your progress.  Your Inpatient Rehabilitation Care Coordinator will talk with you frequently to get your input and to update you on team discussions.  Team conferences with you and your family in attendance may also be held.  Expected length of stay: 10-12 Days  Overall anticipated outcome: Supervision  Depending on your progress and recovery, your program may change. Your Inpatient Rehabilitation Care Coordinator will coordinate services and will keep you informed of any changes. Your Inpatient Rehabilitation Care Coordinator's name and contact numbers are listed  below.  The following services may also be recommended but are not provided by the [facility]:   [place] will be made to provide these services after discharge if needed.  Arrangements include referral to agencies that provide these services.  Your insurance has been verified to be:   Medicare A &B Your primary doctor is:  Malachy Moan, MD  Pertinent information will be  shared with your doctor and your insurance company.  Inpatient Rehabilitation Care Coordinator:  Cathleen Corti [phone] or ([phone]  Information discussed with and copy given to patient by: Dyanne Iha, 12/29/2021, 10:19 AM

## 2021-12-29 NOTE — Care Management (Signed)
[facility] Individual Statement of Services  Patient Name:  Lauren [name]  Date:  12/29/2021  Welcome to the [facility].  Our goal is to provide you with an individualized program based on your diagnosis and situation, designed to meet your specific needs.  With this comprehensive rehabilitation program, you will be expected to participate in at least 3 hours of rehabilitation therapies Monday-Friday, with modified therapy programming on the weekends.  Your rehabilitation program will include the following services:  Physical Therapy (PT), Occupational Therapy (OT), Speech Therapy (ST), 24 hour per day rehabilitation nursing, Therapeutic Recreaction (TR), Psychology, Neuropsychology, Care Coordinator, Rehabilitation Medicine, [place], and Other  Weekly team conferences will be held on Tuesdays to discuss your progress.  Your Inpatient Rehabilitation Care Coordinator will talk with you frequently to get your input and to update you on team discussions.  Team conferences with you and your family in attendance may also be held.  Expected length of stay: 2-2.5 weeks    Overall anticipated outcome: Supervision  Depending on your progress and recovery, your program may change. Your Inpatient Rehabilitation Care Coordinator will coordinate services and will keep you informed of any changes. Your Inpatient Rehabilitation Care Coordinator's name and contact numbers are listed  below.  The following services may also be recommended but are not provided by the [facility] will be made to provide these services after discharge if needed.  Arrangements include referral to agencies that provide these services.  Your insurance has been verified to be:  Medicare A/B   Your primary  doctor is:  Malachy Moan  Pertinent information will be shared with your doctor and your insurance company.  Inpatient Rehabilitation Care Coordinator:  Cathleen Corti [phone] or ([phone]  Information discussed with and copy given to patient by: Rana Snare, 12/29/2021, 7:17 AM

## 2021-12-30 DIAGNOSIS — I63511 Cerebral infarction due to unspecified occlusion or stenosis of right middle cerebral artery: Secondary | ICD-10-CM | POA: Diagnosis not present

## 2021-12-30 MED ORDER — SODIUM CHLORIDE 0.9 % IV SOLN: Freq: Once | INTRAVENOUS | Status: AC

## 2021-12-30 NOTE — Progress Notes (Signed)
Physical Therapy Session Note  Patient Details  Name: Lauren [name] MRN: [id number] Date of Birth: 1950/12/30  Today's Date: 12/30/2021 PT Individual Time: 0900-1000 PT Individual Time Calculation (min): 60 min  and Today's Date: 12/30/2021 PT Missed Time: 30 Minutes Missed Time Reason: Patient fatigue  Short Term Goals: Week 1:  PT Short Term Goal 1 (Week 1): Pt will perform bed mobility with CGA PT Short Term Goal 2 (Week 1): Pt will perform bed to chair transfer consistently with minA. PT Short Term Goal 3 (Week 1): Pt will ambulate x150' consistently with minA and LRAD.  Skilled Therapeutic Interventions/Progress Updates:     1st Session: Pt received seated in recliner and agrees to therapy. No complaint of pain. Reports needing to use restroom. Pt performs sit to stand with CGA and cues for initiation. Ambulatory transfer to toilet with CGA and no LOBs, with cues to ensure eccentric control of stand to sit on toilet for increased safety and strengthening. Pt is continent of bowel and bladder and independent with pericare. Ambulatory transfer to [employer] with cues for positioning. PT provides totalA to don shoes for time management. WC transport to gym. 3lb ankle weight placed on L lower extremity to provide increased NM feedback. Pt ambulates x100' with no AD, with minA/modA and consistent cues to lift L foot during swing phase. When cues not given, pt tends to drag foot or catch toes on each gait cycle. Following rest break, ankle weight removed and pt ambulates additional 100' with no AD. PT alerts pt that cues will not be provided to lift foot and that pt should focus extra attention on safety. Pt completes with minA and no significant LOBs or dragging L foot. Following seated rest break, pt ambulates x150' and has one instance of catching L foot and nearly falling forward, requiring modA/maxA for safety. Pt states that she "lost her concentration for a minute", looking at picture along R wall.    Pt performs alternating toe taps on 4 inch step with mirror for visual feedback. Pt requires minA consistently for balance due to L lateral lean and LOBs, and frequently catching L toes on step due to not making adequately large step backward with L foot, requiring modA. X20 total prior to requiring seated rest break. Pt then performs same activity while holding onto both parallel bars and has much improved consistency of L lower extremity foot position, and no LOBs. X40 total reps completed. WC transport back to room. Left seated with all needs within reach.  2nd Session: PT has discussion with pt and pt's sister and husband regarding team conference and pt's anticipated DC date, as well as answering sister's questions regarding discharge. Pt requesting to rest at this time. PT will follow up as able.  Therapy Documentation Precautions:  Precautions Precautions: Fall Precaution Comments: watch O2 Restrictions Weight Bearing Restrictions: No    Therapy/Group: Individual Therapy  Breck Coons, PT, DPT 12/30/2021, 4:08 PM

## 2021-12-30 NOTE — Progress Notes (Signed)
Occupational Therapy Session Note  Patient Details  Name: Lauren [name] MRN: [id number] Date of Birth: 05/02/1950  Today's Date: 12/30/2021 OT Individual Time: 0990-6893 OT Individual Time Calculation (min): 54 min   Short Term Goals: Week 1:  OT Short Term Goal 1 (Week 1): Patient will use L UE to self feed with min A OT Short Term Goal 2 (Week 1): Patient will complete 2 steps of toileting with min A OT Short Term Goal 3 (Week 1): Patient will locate 2 items on L side of sink with min questioning cues.  Skilled Therapeutic Interventions/Progress Updates:    Pt greeted semi-reclined in bed asleep, easy to wake, but took a few extra minutes to wake up and participate. Pt agreeable to go to the bathroom. CGA to ambulate into bathroom without RW and transitioned to sitting on toilet without BSC. Pt able to manage clothing with CGA for balance. Pt voided bladder and completed peri-care without cues. Pt took seated rest break at EOB and then was ready to walk to the gym. Pt however confused about commands and trying to push the wheelchair. Pt required multimodal cues to leave the wc and initiate walk to the therapy apartment. Difficulty following commands within novel task and in new environment. Pt needed max multimodal cues to locate the table at the end of the kitchen in therapy apartment, and multimodal cues to attend to cues to sit down at table. L hand [place] with yellow theraputty. Multimodal cues to understand directions with theraputty. Worked on functional grip and pinch. Standing balance on foam while completing UB there-ex 3 sets of 10 chest press, bicep curl, and straight arm raise wiith 1 lb dowel rod. Multimodal cues and demonstration for correct form and guided A with L UE for shoulder weakness. Continued working on L UE and balance with standing reaching task using pinch pins and basketball net. Pinch pins placed around waist to simulate clothing management as well. Pt needed min A for  balance when reaching oustide base of support on foam mat during pinch pin task. Rest breaks in between sets. Pt ambulated back to room with min A and No AD. Pt left seated in wc with alarm belt on, call bell in reach, and needs met.   Therapy Documentation Precautions:  Precautions Precautions: Fall Precaution Comments: watch O2 Restrictions Weight Bearing Restrictions: No Pain:  Denies pain   Therapy/Group: Individual Therapy  Valma Cava 12/30/2021, 8:01 AM

## 2021-12-30 NOTE — Progress Notes (Signed)
Speech Language Pathology Daily Session Note  Patient Details  Name: Lauren [name] MRN: [id number] Date of Birth: [birth year]  Today's Date: 12/30/2021 SLP Individual Time: 2336-1224 SLP Individual Time Calculation (min): 65 min  Short Term Goals: Week 1: SLP Short Term Goal 1 (Week 1): Patient will consume current diet with minimal overt s/s of aspiration witth Mod verbal cues for use of swallowing compensatory strategies. SLP Short Term Goal 2 (Week 1): Patient will consume trials of thin liquids via cup with mod verbal cues for use of swallowing compensatory strategies and overt s/s of aspiration in less than 25% of trials. SLP Short Term Goal 3 (Week 1): Patient will attened to left field of enviornment/body during functional tasks with mod verbal cues. SLP Short Term Goal 4 (Week 1): Patient will demonstrate sustained attention to functional tasks for 10 minutes with Mod verbal cues for redirection. SLP Short Term Goal 5 (Week 1): Patient will demonstrate functional problem solving for basic and familiar tasks with Mod verbal cues.  Skilled Therapeutic Interventions: Skilled treatment session focused on dysphagia and cognitive goals. Upon arrival, patient was awake while upright in the wheelchair. Patient was receiving a breathing treatment while also eating fruit gummies. Patient with one overt coughing episode with pieces of the fruit snacks removed from the patient's oral cavity several minutes later. SLP provided education regarding appropriate textures for current diet recommendations. Patient verbalized understanding but will need reinforcement. Patient also told nursing she is taking medications whole with liquids. SLP clarified she needs them whole in puree to maximize safety. Patient consumed medications whole in puree without overt s/s of aspiration. Patient self-fed the reminder of her applesauce with her LUE with Mod verbal cues for attention. SLP also facilitated session by  providing education regarding use of a pill box to maximize recall, problem solving and safety with medication administration. Patient reported that using the pill box is too difficult because of her RUE tremor. SLP provided demonstration and strategies to maximize ease of use with patient returning demonstration with Mod verbal cues due to impulsivity. Patient requested to use the bathroom and was continent of urine and required Min verbal cues to locate and reach for soap in her left visual field. Patient transferred back to bed at end of session. Patient with alarm on and all needs within reach. Continue with current plan of care.      Pain Pain Assessment Pain Score: 0-No pain  Therapy/Group: Individual Therapy  Jem Castro 12/30/2021, 3:37 PM

## 2021-12-30 NOTE — Progress Notes (Signed)
PROGRESS NOTE   Subjective/Complaints:  Pt seen sitting up eating breakfast with husband at bedside. Inquiring about discharge date. No acute complaints or events overnight. State vision blurring is much improved.   ROS: Denies fevers, HA, hills, N/V, abdominal pain, constipation, diarrhea, SOB, cough, chest pain, new weakness or paraesthesias.    Objective:   No results found. Recent Labs    12/29/21 0655  WBC 8.1  HGB 13.8  HCT 42.7  PLT 548*    Recent Labs    12/29/21 0655  NA 140  K 3.8  CL 105  CO2 28  GLUCOSE 87  BUN 13  CREATININE 0.62  CALCIUM 8.8*     Intake/Output Summary (Last 24 hours) at 12/30/2021 0943 Last data filed at 12/29/2021 1825 Gross per 24 hour  Intake 358 ml  Output --  Net 358 ml         Physical Exam: Vital Signs Blood pressure (!) 103/54, pulse 67, temperature 98.1 F (36.7 C), resp. rate 17, height 5' (1.524 m), weight 42.7 kg, SpO2 95 %. Constitutional: No apparent distress. Appropriate appearance for age.  HENT: No JVD. Neck Supple. Trachea midline. Atraumatic, normocephalic. Eyes: PERRLA. EOMI. Visual fields grossly intact.  Cardiovascular: RRR, no murmurs/rub/gallops. No Edema. Peripheral pulses 2+  Respiratory: CTAB. No rales, rhonchi, or wheezing. On RA. Good air movement. Normal rate Abdomen: + bowel sounds, normoactive. No distention or tenderness.  Skin: Mild DTI over sacrum, not examined today   MSK:      No apparent deformity. No ttp over axial spine, paraspinals, or ribs.       Strength: Moving all 4 limbs antigravity and against resistance. From prior exam:  Neurologic exam:  Cognition: AAO to person, place, time and event. + L inattention, mild Language: Fluent, No substitutions or neoglisms.  Mood: Mildly pressured speech and impulsivity - appears improved form prior encounters CN: +L facial droop  MSK: Moving all 4 limbs antigravity and against  resistance   PE from prior encounters: RUE 5/5 RLE- HF 4+/5; KE 5-/5; DF and PF 5/5 LUE- Biceps 4/5; Triceps 4/5; WE 4+/5; Grip 4+/5; and FA 3+/5 LLE- HF 4/5; KE 4+/5; DF/PF 4+/5        Assessment/Plan: 1. Functional deficits which require 3+ hours per day of interdisciplinary therapy in a comprehensive inpatient rehab setting. Physiatrist is providing close team supervision and 24 hour management of active medical problems listed below. Physiatrist and rehab team continue to assess barriers to discharge/monitor patient progress toward functional and medical goals  Care Tool:  Bathing    Body parts bathed by patient: Right arm, Left arm, Chest, Abdomen, Front perineal area, Buttocks, Right upper leg, Left upper leg, Face   Body parts bathed by helper: Right lower leg, Left lower leg     Bathing assist Assist Level: Minimal Assistance - Patient > 75%     Upper Body Dressing/Undressing Upper body dressing   What is the patient wearing?: Pull over shirt    Upper body assist Assist Level: Minimal Assistance - Patient > 75%    Lower Body Dressing/Undressing Lower body dressing      What is the patient wearing?: Underwear/pull up,  Pants     Lower body assist Assist for lower body dressing: Minimal Assistance - Patient > 75%     Toileting Toileting    Toileting assist Assist for toileting: Minimal Assistance - Patient > 75%     Transfers Chair/bed transfer  Transfers assist     Chair/bed transfer assist level: Minimal Assistance - Patient > 75%     Locomotion Ambulation   Ambulation assist      Assist level: Maximal Assistance - Patient 25 - 49% Assistive device: No Device Max distance: 150'   Walk 10 feet activity   Assist     Assist level: Maximal Assistance - Patient 25 - 49% Assistive device: No Device   Walk 50 feet activity   Assist    Assist level: Maximal Assistance - Patient 25 - 49% Assistive device: No Device    Walk 150  feet activity   Assist    Assist level: Maximal Assistance - Patient 25 - 49% Assistive device: No Device    Walk 10 feet on uneven surface  activity   Assist     Assist level: Moderate Assistance - Patient - 50 - 74% Assistive device:  (R handrail)   Wheelchair     Assist Is the patient using a wheelchair?: Yes Type of Wheelchair: Manual    Wheelchair assist level: Total Assistance - Patient < 25% Max wheelchair distance: 150'    Wheelchair 50 feet with 2 turns activity    Assist        Assist Level: Total Assistance - Patient < 25%   Wheelchair 150 feet activity     Assist      Assist Level: Total Assistance - Patient < 25%   Blood pressure (!) 103/54, pulse 67, temperature 98.1 F (36.7 C), resp. rate 17, height 5' (1.524 m), weight 42.7 kg, SpO2 95 %.  Medical Problem List and Plan: 1. Functional deficits secondary to R MCA stroke with L sensory deficits and L hemiparesis as well as dysphagia and L inattention             -patient may  shower             -ELOS/Goals: 10-14 days- supervision maybe min A due to sensory deficits  -Continue CIR with PT/OT/SLP 2.  Antithrombotics: -DVT/anticoagulation:  Pharmaceutical: Lovenox             -antiplatelet therapy: ASA. Will contact Neuro re: addition of Plavix - ?.  3. DDD Cervical spine/Pain Management: tylenol prn.   4. Mood/Behavior/Sleep:  LCSW to follow for evaluation and support.              - Hx  ADHD: On Adderall daily.              - Hx Bipolar disorder: Stable on home regimen of Lamictal, Seroquel and buspar for anxiety  - ?mania component w/ impulsivity; per family somewhat baseline but behaviors have worsened s/p strokes              -antipsychotic agents:  Continue              - neuropsych consult next week if appropriate - Scheduled Friday 10/6            - 9/30: Fall OOB, noted impulsive behaviors and poor safety awareness, however no further tele available in hospital; nursing to  place belt alarm in bed  5. Neuropsych/cognition: This patient may be intermittently capable of making decisions on her own behalf.  6. Skin/Wound Care: Routine pressure relief measures.           - Sacral DTI - Sacral heart, change PRN  7. Fluids/Electrolytes/Nutrition: Monitor I/O. Check CMET in am.    Latest Ref Rng & Units 12/29/2021    6:55 AM 12/26/2021    6:31 AM 12/25/2021    5:51 AM  BMP  Glucose 70 - 99 mg/dL 87  86  103   BUN 8 - 23 mg/dL 13  13  11    Creatinine 0.44 - 1.00 mg/dL 0.62  0.61  0.43   Sodium 135 - 145 mmol/L 140  140  140   Potassium 3.5 - 5.1 mmol/L 3.8  4.2  3.9   Chloride 98 - 111 mmol/L 105  105  105   CO2 22 - 32 mmol/L 28  30  26    Calcium 8.9 - 10.3 mg/dL 8.8  8.9  8.9      8. HTN: Monitor BP TID, well controlled    12/30/2021    6:10 AM 12/29/2021    7:30 PM 12/29/2021    2:02 PM  Vitals with BMI  Systolic 185 631 497  Diastolic 54 65 65  Pulse 67 69 65    9. CAD: Monitor for symptoms with increase in activity. Continue ASA, Lipitor. Denies chest pain 10. 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  --Stable on Breztri PTA and husband provided from home.  11. Chronic tremors: Managed with inderal and Lamictal--worse in am and gets better as day progresses.  12.  Perforated gastric ulcer s/p repair 07/2021: On PPI 13. OSA: Nocturnal oxygen in the  past. Will order prn.          - 10/3 pt refusing CPAP as no longer needs OP s/p weight loss. Will DC 14. Dysphagia/Cough: on D2 nectar thick liquids. Educated patient and husband on aspiration risk and need for nectar liquids.              --flutter valve added.             --check CXR - 9/29 no acute cardiopulmonary process  -Upgraded to DYS 3 diet 9/29  15. Blurred vision - added PRN eye drops. - improved              - Encouraged work with therapies.               - Can consider OP neuroptho eval if persistent.    LOS: 5 days A FACE TO FACE  EVALUATION WAS PERFORMED  Gertie Gowda 12/30/2021, 9:43 AM

## 2021-12-30 NOTE — Progress Notes (Signed)
Received call from nurse, reporting patient with hypotension. Ask her to check manual blood pressure, note was reviewed and medication list. Normal saline bolus ordered and repeat vitals in hours, she verbalized understanding.

## 2021-12-30 NOTE — Patient Care Conference (Signed)
Inpatient RehabilitationTeam Conference and Plan of Care Update Date: 12/30/2021   Time: 10:42 AM    Patient Name: Lauren [name]      Medical Record Number: [id number]  Date of Birth: Jan 15, 1951 Sex: Female         Room/Bed: 4M13C/4M13C-01 Payor Info: Payor: MEDICARE / Plan: MEDICARE PART A AND B / Product Type: *No Product type* /    Admit Date/Time:  12/25/2021 11:50 AM  Primary Diagnosis:  Acute ischemic right MCA stroke [facility])  Hospital Problems: Principal Problem:   Acute ischemic right MCA stroke [facility])    Expected Discharge Date: Expected Discharge Date: 01/09/22  Team Members Present: Physician leading conference: Other (comment) (Dr. Tressa Busman) Social Worker Present: Loralee Pacas, [place] Nurse Present: Dorien Chihuahua, RN PT Present: Tereasa Coop, PT OT Present: Cherylynn Ridges, OT SLP Present: Weston Anna, SLP     Current Status/Progress Goal Weekly Team Focus  Bowel/Bladder   Continent of B/B. Last BM 10/2. Pt. has urinary frequency.  Remain continent.  Monitorfor changes in B/B function.   Swallow/Nutrition/ Hydration   Dys. 3 textures with nectar-thick liquids, trials of thin liquids with SLP, Mod A for use of compensatory strategies  Min A  tolerance of current diet, use of strategies, trials of thin with SLP   ADL's   CGA/min A overall for BADL tasks  supervision overall  self-care retraining, cognitive retraining, dc planning, pt/family education, attention, awareness   Mobility   supervision bed mobility, minA sit to stand, minA to totalA ambulation without AD due to frequent LOBs  Supervision  safety awareness, L hemibody NMR, balance, ambulation   Communication             Safety/Cognition/ Behavioral Observations  Mod A  Min A  functional problem solving, sustained attention, emergent awareness, safety awareness, recall with use of strategies   Pain   Denies pain.  Pain of 0.  Monitor for pain q shift and prn.   Skin   CDI. Foam to bottom for  prophylaxis.  No skin breakdown.  Assess skin q shift and prn.     Discharge Planning:  D/c to home with support from husband and family. SW will confirm no barriers to discharge.   Team Discussion: Patient post right MCA CVA with impulsivity, inattention, History of bipolar and "off" since CVA. Blurred vision improved but remains confused and easily distracted and requires extra time for processing and use of multimodal cues. Improved coordination but left side weakness persists with note of loss of balance when distracted.  Patient on target to meet rehab goals: yes, currently CGA overall. On D3 nectar thick liquid diet. Goals for discharge set for supervision.  *See Care Plan and progress notes for long and short-term goals.   Revisions to Treatment Plan:  Thin liquid trials Neuro psych referral  Dietician/diabetes coordinator follow up  Teaching Needs: Safety, dietary modifications, medications, secondary risk management, transfers, toileting, etc  Current Barriers to Discharge: Decreased caregiver support  Possible Resolutions to Barriers: Family education     Medical Summary Current Status: medically stable  Barriers to Discharge: Behavior;Home enviroment access/layout;Medication compliance;Wound care  Barriers to Discharge Comments: impulsivity/attention difficulty, left inattention Possible Resolutions to [employer] Focus: therapies, neuropsych evaluating for behavior plan   Continued Need for Acute Rehabilitation Level of Care: The patient requires daily medical management by a physician with specialized training in physical medicine and rehabilitation for the following reasons: Direction of a multidisciplinary physical rehabilitation program to maximize functional independence : Yes Medical  management of patient stability for increased activity during participation in an intensive rehabilitation regime.: Yes Analysis of laboratory values and/or radiology reports  with any subsequent need for medication adjustment and/or medical intervention. : Yes   I attest that I was present, lead the team conference, and concur with the assessment and plan of the team.   Dorien Chihuahua B 12/30/2021, 2:22 PM

## 2021-12-30 NOTE — Progress Notes (Signed)
Patient ID: Lauren [name], female   DOB: 24-Aug-1950, 71 y.o.   MRN: [id number]  SW briefly met with pt and pt husband to provide updates from team conference, and d/c date 10/13. SW will follow-up later to discuss scheduling family edu since pt had session with neuropsych.  Loralee Pacas, MSW, [place] Office: [phone] Cell: [phone] Fax: [phone]

## 2021-12-31 MED ORDER — SODIUM CHLORIDE 0.9 % IV SOLN: INTRAVENOUS | Status: DC

## 2021-12-31 MED ORDER — OXYBUTYNIN CHLORIDE 5 MG PO TABS
2.5000 mg | ORAL_TABLET | Freq: Three times a day (TID) | ORAL | Status: DC
  Administered 2021-12-31 – 2022-01-08 (×24): 2.5 mg via ORAL
  Filled 2021-12-31 (×26): qty 0.5

## 2021-12-31 NOTE — Progress Notes (Addendum)
Patient's vitals showed yellow MEWS. BP 70/40 manually. Pt. Asymptomatic. Called Eunice to report abnormal BP. Orders received. Reported back result of BP post bolus x2. BP post bolus 84/49 and 83/52. Received order for normal saline 50cc/hr continuous. RR and charge nurse aware.  At 5:00 am  BP is 106/77 HR 70. Zella Ball ordered to modify IV fluids from 42ml/hr to 52ml/hr.

## 2021-12-31 NOTE — Evaluation (Signed)
Recreational Therapy Assessment and Plan  Patient Details  Name: Lauren [name] MRN: [id number] Date of Birth: June 07, 1950 Today's Date: 12/31/2021  Rehab Potential:  Good ELOS:   d/c 10/19  Assessment Hospital Problem: Principal Problem:   Acute ischemic right MCA stroke [facility])     Past Medical History:      Past Medical History:  Diagnosis Date   Anxiety     Bipolar 1 disorder ([place])     Bipolar disorder ([place])     Breast cancer ([place])     Breast cancer ([place])     Bronchitis     CAD (coronary artery disease)     DDD (degenerative disc disease), cervical     Diabetes mellitus without complication ([place])     Emphysema lung ([place])     Hypertension     ICH (intracerebral hemorrhage) ([place])     Lung cancer ([place])     Occasional tremors     Parotid mass     PUD (peptic ulcer disease)      Past Surgical History:       Past Surgical History:  Procedure Laterality Date   BREAST LUMPECTOMY       CORONARY ANGIOPLASTY WITH STENT PLACEMENT       GASTRIC BYPASS   2016   KNEE ARTHROSCOPY       REPAIR OF PERFORATED ULCER   07/2021      Assessment & Plan Clinical Impression: Patient is a 71 year old LH-female with history of CAD, HTN, T2DM, RML lung CA s/p SBRT, 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  CT head was positive for right MCA toyed cytotoxic edema with hyperdense right MCA and CTA was positive for emergent LVO R-MCA mid M1 segment with pseudonormalization of right MCA infarct.  Also noted to have 64% R-ICA and 72% L-SA stenosis, LUL indolent lung cancer that was stable compared with CT 07/30/2021 note of emphysema.  MRI brain showed moderate right MCA infarct with trace petechial hemorrhage right caudate  chronic hemosiderin with encephalomalacia posterior lateral left superior frontal gyrus (new since last year) she was started on hypertonic saline and ASA added for stroke due to intracranial large vessel atherosclerosis and uncontrolled risk factors. Per records, patient had tested positive for COVID on Sunday 09/17 and husband was sick prior to patient and no quarantine needed.   CT cervical spine done for work-up and showed healed old left C1 ring and left C6 and C7 fractures.  MRI brain done with contrast due to concerns of metastasis and showed no pathological enhancement.  2D echo showed EF 50 to 55% mild LVH.  Follow-up CT head 09/25 showed stable MCA infarct with evolving edema and mild mass effect.  Hypertonic saline was weaned off.  Speech therapy following for diet tolerance and this was slowly advanced to dysphagia 3, nectar liquids.    Dr. Leonie Man felt that she would likely need DAPT with aggressive risk factor modification at discharge and has discussed this with husband. PT/OT has been working with patient who continues to be limited by left sided weakness, balance deficits, SOB with ambulation, anxiety and impulsivity.  Patient transferred to CIR on 12/25/2021.  Pt presents with decreased activity tolerance, decreased functional mobility, decreased balance, decreased coordination,  left inattention, decreased awareness, decreased problem solving and safety awareness, feelings of stress Limiting pt's independence with leisure/community pursuits.  Met with pt & husband today to discuss TR services including leisure education, activity analysis/modifications and stress management.  Also discussed the importance of social, emotional, spiritual health in addition to physical health and their effects on overall health and wellness.  Pt stated understanding.  Pt enjoys spending time with her dog, traveling, participating in historical reenactments & crafting.    Plan  Min 1 TR session >20 minutes  during LOS  Recommendations for other services: Neuropsych  Discharge Criteria: Patient will be discharged from TR if patient refuses treatment 3 consecutive times without medical reason.  If treatment goals not met, if there is a change in medical status, if patient makes no progress towards goals or if patient is discharged from hospital.  The above assessment, treatment plan, treatment alternatives and goals were discussed and mutually agreed upon: by patient  [place] 12/31/2021, 2:06 PM

## 2021-12-31 NOTE — Progress Notes (Signed)
   12/30/21 2050  Vitals  Temp 98.2 F (36.8 C)  BP (!) 70/40 (manual)  BP Location Left Arm  BP Method Manual  Patient Position (if appropriate) Lying  Pulse Rate 77  Pulse Rate Source Dinamap  Resp 16  MEWS COLOR  MEWS Score Color Yellow  Oxygen Therapy  SpO2 93 %  O2 Device Room Air  Patient Activity (if Appropriate) In bed  Pain Assessment  Pain Scale 0-10  Pain Score 0  MEWS Score  MEWS Temp 0  MEWS Systolic 2  MEWS Pulse 0  MEWS RR 0  MEWS LOC 0  MEWS Score 2  Provider Notification  Provider Name/Title Danella Sensing  Date Provider Notified 12/30/21  Time Provider Notified 2100  Method of Notification Call  Notification Reason Other (Comment) (mews yellow)  Provider response See new orders  Date of Provider Response 12/30/21  Time of Provider Response 2100  Rapid Response Notification  Name of Rapid Response RN Notified Waunita Schooner  Date Rapid Response Notified 12/30/21  Time Rapid Response Notified 2130

## 2021-12-31 NOTE — Progress Notes (Signed)
Occupational Therapy Session Note  Patient Details  Name: Roschelle Calandra MRN: [id number] Date of Birth: 13-Mar-1951  Today's Date: 12/31/2021 OT Individual Time: 1030-1130 OT Individual Time Calculation (min): 60 min    Short Term Goals: Week 1:  OT Short Term Goal 1 (Week 1): Patient will use L UE to self feed with min A OT Short Term Goal 2 (Week 1): Patient will complete 2 steps of toileting with min A OT Short Term Goal 3 (Week 1): Patient will locate 2 items on L side of sink with min questioning cues.  Skilled Therapeutic Interventions/Progress Updates:    Patient agreeable to participate in OT session. Reports 0/10 pain level. Reports that his is her second stroke. Her first stroke was more severe.  Patient participated in skilled OT session focusing on ADL re-training, functional transfers, standing balance, bilateral coordination, and task sequencing. Therapist integrated static standing balance while pt completed grooming task of brushing teeth  in order to improve balance and functional performance during ADL tasks in preparation of discharge home with husband. Sit<>stand completed at sink with Min A. Due to decrease core strength and right side weakness, pt relied on sink to maintain balance as she leaned moderately against for support. Pt was able to acknowledge and verbalized lean demonstrating good insight and awareness of deficits. Approximately 2 minutes into teeth brushing, pt stopped abruptly and announced she needed to use the bathroom. She completed toilet transfer with SBA using grab bars. Toileting completed with SBA as pt was able to complete all tasks. Upon returning to sink, therapist reminded pt that she had not completed brushing her teeth and it would be a good thing to finish. Pt verbalized understanding although did not follow through with brushing her teeth and instead altered attention to asking her husband for her [place] in order to brush her hair. VC provided to  redirect pt to brushing her teeth. Pt able to tolerate standing for ~2-3 minutes before requesting to sit.  Increased time needed to complete teeth brushing task, managing dentures, due to lack of attention, decreased ability to sequence appropriately, difficulty with time management. Pt placed toothpaste on dentures and proceeded to put them in her mouth. Did not realize error until therapist pointed it out. Pt was able to complete all tasks of grooming regardless of increased time.  With remaining time, pt completed w/c management and self propelled w/c to small gym with moderate VC for directions and memory of directions. Pt completed functional reaching task seated using 1lb wrist weight on left UE while placing/removing  Squigz from vertical surface in order to increase LUE strength and scapular stability needed when completing functional reaching during self care tasks. Pt completed w/c management upon returning to room with directions provided on room # and min VC to help with location. When in room, pt experienced max difficulty with carry over of directional instructions while maneuvering w/c into position next to bed. Required min A to complete correctly. '  Therapy Documentation Precautions:  Precautions Precautions: Fall Precaution Comments: watch O2 Restrictions Weight Bearing Restrictions: No   Therapy/Group: Individual Therapy  Ailene Ravel, OTR/L,CBIS  Supplemental OT - [place] and [facility]  12/31/2021, 7:58 AM

## 2021-12-31 NOTE — Progress Notes (Addendum)
Speech Language Pathology Daily Session Note  Patient Details  Name: Lauren [name] MRN: [id number] Date of Birth: 1950-10-11  Today's Date: 12/31/2021 SLP Individual Time: 1230-1330 SLP Individual Time Calculation (min): 60 min  Short Term Goals: Week 1: SLP Short Term Goal 1 (Week 1): Patient will consume current diet with minimal overt s/s of aspiration witth Mod verbal cues for use of swallowing compensatory strategies. SLP Short Term Goal 2 (Week 1): Patient will consume trials of thin liquids via cup with mod verbal cues for use of swallowing compensatory strategies and overt s/s of aspiration in less than 25% of trials. SLP Short Term Goal 3 (Week 1): Patient will attened to left field of enviornment/body during functional tasks with mod verbal cues. SLP Short Term Goal 4 (Week 1): Patient will demonstrate sustained attention to functional tasks for 10 minutes with Mod verbal cues for redirection. SLP Short Term Goal 5 (Week 1): Patient will demonstrate functional problem solving for basic and familiar tasks with Mod verbal cues.  Skilled Therapeutic Interventions:Skilled ST services focused on swallow and cognitive skills. Pt was consuming dys 3 textures and nectar thick liquids via cup on tray with husband providing supervision upon entering room. Pt demonstrated mild-moderate left pocketing, but able to clear oral cavity with lingual sweeps and liquid washes mod I. Pt requested to use the bathroom and required supervision A with problem solving, but was mildly impulsive. SLP facilitated sustained attention, left scanning and basic-mildly complex problem solving in PEG design task. Pt required supervision A verbal cues for error awareness/problem solving and extra time due to reduced attention (intervals of 20 minutes mod A verbal cues.) Pt required supervision A verbal cues to scan left on table top task. Pt was left in room with call bell within reach and chair alarm set. SLP recommends to  continue skilled services.     Pain Pain Assessment Pain Scale: 0-10 Pain Score: 0-No pain  Therapy/Group: Individual Therapy  [name]  [facility] 12/31/2021, 2:03 PM

## 2021-12-31 NOTE — Progress Notes (Signed)
Physical Therapy Session Note  Patient Details  Name: Lauren [name] MRN: [id number] Date of Birth: [birth year]  Today's Date: 12/31/2021 PT Individual Time: [phone] and [phone] PT Individual Time Calculation (min): 57 min and 27 min  Short Term Goals: Week 1:  PT Short Term Goal 1 (Week 1): Pt will perform bed mobility with CGA PT Short Term Goal 2 (Week 1): Pt will perform bed to chair transfer consistently with minA. PT Short Term Goal 3 (Week 1): Pt will ambulate x150' consistently with minA and LRAD.  Skilled Therapeutic Interventions/Progress Updates:     1st Session: Pt received supine in bed and agrees to therapy. No complaint of pain. Husband inquiring on family training for in-room mobility and assisting pt to restroom. PT educates on importance of remaining on pt's L side, ensuring that pt remains on task, and use of RW. Pt performs bed mobility with cues for positioning. Sit to stand to RW with husband providing CGA and PT providing cues for hand placement. Pt ambulates to toilet with RW and CGA from husband, with cues to utilize eccentric control of stand to sit on toilet. Pt continent of bowel and bladder and performs pericare with setup assistance. Pt ambulates to [facility] with cues for AD management and positioning. WC transport to gym. Pt completes sit to stand with CGA and cues for initiation. Pt ambulates x250' with CGA primarily and one instance of minA due to pt becoming distracted and having LOB to the L. Otherwise pt does a good job of lifting L foot for swing phase and maintaining attention to task. Extended seated rest break prior to additional mobility.  Pt ambulates to stairs with CGA, then completes x16 6" steps with bilateral hand rails and cues for step sequencing. Pt attempting to perform reciprocal stepping pattern and requires cueing for safety. Overall pt requires CGA/minA for safety with stair training. WC transport back to room. Pt left seated in WC with alarm  intact and all needs within reach.  2nd Session: Pt received saeted in [facility] and agrees to therapy. No complaint of pain. Pt verbalizes need to use rest room. Pt ambulates to toilet with RW and cues for safe AD management and positioning for stand to sit. Following, pt transfers to [facility] with cues for sequencing. Wc transport outside. Pt performs gait training outside for ambulation over unlevel and varying surfaces. Pt performs sit to stand with cues for initiation. Pt ambulates without AD, and with PT intentionally providing few verbal cues in order to challenge and assess pt's safety awareness. Pt ambulates >700' with multiple brief standing rest break. Pt takes self selected path and ambulates down x10 steps with minA and use of L hand rail. Pt also manages curbs and ambulates across street, though demonstrates significant deficits in awareness of surroundings, typically ambulating in most direct path in R visual field, rather than planning a comprehensive route. Pt gait pattern deteriorates with fatigue and by the end of ambulation pt requires minA to [facility] for safety due to increased postural sway and flexion in bilateral hips and knees throughout gait cycle. WC transport back inside. Pt left seated in WC with alarm intact and all needs within reach.  Therapy Documentation Precautions:  Precautions Precautions: Fall Precaution Comments: watch O2 Restrictions Weight Bearing Restrictions: No    Therapy/Group: Individual Therapy  Breck Coons, PT, DPT 12/31/2021, 4:57 PM

## 2021-12-31 NOTE — Progress Notes (Signed)
PROGRESS NOTE   Subjective/Complaints:  Pt seen at bedside, eating breakfast, husband at bedside.  Overnight pt noted hypotensive, 70/40; pt reports she was asymptomstic other than "tired". Received 2x 250 cc boluses, then placed on 30 cc/hr. States she is feeling much better this AM.   Nursing reports Q15-30 min calls for toiletting over the past 2 nights. Patient states she has chronic urgency and "when I need to go, I just can't hold it". It has been somewhat worse since her hospitalization. She has never been tried on a medication for urgency.   Patient c/o dry mouth, since her first stroke but worsened since her second stroke.   ROS: Denies fevers, HA, hills, N/V, abdominal pain, constipation, diarrhea, SOB, cough, chest pain, new weakness or paraesthesias.    Objective:   No results found. Recent Labs    12/29/21 0655  WBC 8.1  HGB 13.8  HCT 42.7  PLT 548*    Recent Labs    12/29/21 0655  NA 140  K 3.8  CL 105  CO2 28  GLUCOSE 87  BUN 13  CREATININE 0.62  CALCIUM 8.8*     Intake/Output Summary (Last 24 hours) at 12/31/2021 1231 Last data filed at 12/31/2021 0921 Gross per 24 hour  Intake 458 ml  Output --  Net 458 ml         Physical Exam: Vital Signs Blood pressure 106/71, pulse 76, temperature 98.4 F (36.9 C), temperature source Oral, resp. rate 16, height 5' (1.524 m), weight 42.7 kg, SpO2 95 %. Constitutional: No apparent distress. Appropriate appearance for age.  HENT: No JVD. Neck Supple. Trachea midline. Atraumatic, normocephalic. +glasses Eyes: PERRLA. EOMI. Visual fields grossly intact.  Cardiovascular: RRR, no murmurs/rub/gallops. No Edema. Peripheral pulses 2+  Respiratory: CTAB. No rales, rhonchi, or wheezing. On RA. Good air movement. Normal rate Abdomen: + bowel sounds, normoactive. No distention or tenderness.  Skin: Mild DTI over sacrum, not examined today   MSK:      No  apparent deformity.       Strength: Moving all 4 limbs antigravity and against resistance.   Neurologic exam:  Cognition: AAO to person, place, time and event. + L inattention, mild Language: Fluent, No substitutions or neoglisms.  Mood: Mildly pressured speech and impulsivity - ongoing CN: +L facial droop, otherwise intact.    PE from prior encounters: RUE 5/5 RLE- HF 4+/5; KE 5-/5; DF and PF 5/5 LUE- Biceps 4/5; Triceps 4/5; WE 4+/5; Grip 4+/5; and FA 3+/5 LLE- HF 4/5; KE 4+/5; DF/PF 4+/5        Assessment/Plan: 1. Functional deficits which require 3+ hours per day of interdisciplinary therapy in a comprehensive inpatient rehab setting. Physiatrist is providing close team supervision and 24 hour management of active medical problems listed below. Physiatrist and rehab team continue to assess barriers to discharge/monitor patient progress toward functional and medical goals  Care Tool:  Bathing    Body parts bathed by patient: Right arm, Left arm, Chest, Abdomen, Front perineal area, Buttocks, Right upper leg, Left upper leg, Face   Body parts bathed by helper: Right lower leg, Left lower leg     Bathing assist Assist  Level: Minimal Assistance - Patient > 75%     Upper Body Dressing/Undressing Upper body dressing   What is the patient wearing?: Pull over shirt    Upper body assist Assist Level: Minimal Assistance - Patient > 75%    Lower Body Dressing/Undressing Lower body dressing      What is the patient wearing?: Underwear/pull up, Pants     Lower body assist Assist for lower body dressing: Minimal Assistance - Patient > 75%     Toileting Toileting    Toileting assist Assist for toileting: Minimal Assistance - Patient > 75%     Transfers Chair/bed transfer  Transfers assist     Chair/bed transfer assist level: Minimal Assistance - Patient > 75%     Locomotion Ambulation   Ambulation assist      Assist level: Maximal Assistance - Patient  25 - 49% Assistive device: No Device Max distance: 150'   Walk 10 feet activity   Assist     Assist level: Maximal Assistance - Patient 25 - 49% Assistive device: No Device   Walk 50 feet activity   Assist    Assist level: Maximal Assistance - Patient 25 - 49% Assistive device: No Device    Walk 150 feet activity   Assist    Assist level: Maximal Assistance - Patient 25 - 49% Assistive device: No Device    Walk 10 feet on uneven surface  activity   Assist     Assist level: Moderate Assistance - Patient - 50 - 74% Assistive device:  (R handrail)   Wheelchair     Assist Is the patient using a wheelchair?: Yes Type of Wheelchair: Manual    Wheelchair assist level: Total Assistance - Patient < 25% Max wheelchair distance: 150'    Wheelchair 50 feet with 2 turns activity    Assist        Assist Level: Total Assistance - Patient < 25%   Wheelchair 150 feet activity     Assist      Assist Level: Total Assistance - Patient < 25%   Blood pressure 106/71, pulse 76, temperature 98.4 F (36.9 C), temperature source Oral, resp. rate 16, height 5' (1.524 m), weight 42.7 kg, SpO2 95 %.  Medical Problem List and Plan: 1. Functional deficits secondary to R MCA stroke with L sensory deficits and L hemiparesis as well as dysphagia and L inattention             -patient may  shower             -ELOS/Goals: 10-14 days- supervision maybe min A due to sensory deficits  -Continue CIR with PT/OT/SLP 2.  Antithrombotics: -DVT/anticoagulation:  Pharmaceutical: Lovenox             -antiplatelet therapy: ASA. Per DC, "She has been started on only aspirin 81 mg p.o. daily instead of DAPT due to previous history of large ICH/hemorrhagic stroke." Can address with Neuro at OP f/u.   3. DDD Cervical spine/Pain Management: tylenol prn.   4. Mood/Behavior/Sleep:  LCSW to follow for evaluation and support.              - Hx  ADHD: On Adderall daily.               - Hx Bipolar disorder: Stable on home regimen of Lamictal, Seroquel and buspar for anxiety  - ?mania component w/ impulsivity; per family somewhat baseline but behaviors have worsened s/p strokes              -  antipsychotic agents:  Continue              - neuropsych consult next week if appropriate - Scheduled Friday 10/6            - 9/30: Fall OOB, noted impulsive behaviors and poor safety awareness, however no further tele available in hospital; nursing to place belt alarm in bed             - 10/4 DC scheduled atarax d/t dry mouth, not home med. PRN available and not used.   5. Neuropsych/cognition: This patient may be intermittently capable of making decisions on her own behalf. 6. Skin/Wound Care: Routine pressure relief measures.           - Sacral DTI - Sacral heart, change PRN  7. Fluids/Electrolytes/Nutrition: Monitor I/O. Check CMET in am.    Latest Ref Rng & Units 12/29/2021    6:55 AM 12/26/2021    6:31 AM 12/25/2021    5:51 AM  BMP  Glucose 70 - 99 mg/dL 87  86  103   BUN 8 - 23 mg/dL 13  13  11    Creatinine 0.44 - 1.00 mg/dL 0.62  0.61  0.43   Sodium 135 - 145 mmol/L 140  140  140   Potassium 3.5 - 5.1 mmol/L 3.8  4.2  3.9   Chloride 98 - 111 mmol/L 105  105  105   CO2 22 - 32 mmol/L 28  30  26    Calcium 8.9 - 10.3 mg/dL 8.8  8.9  8.9      8. HTN: Monitor BP TID, well controlled    12/31/2021    9:06 AM 12/31/2021    9:04 AM 12/31/2021    4:53 AM  Vitals with BMI  Systolic [phone]  Diastolic 71 71 77  Pulse 76  70  - 10/4 IVF overnight for asymptomatic hypotension 70/40; improved this AM, DC maintenance fluids.    9. CAD: Monitor for symptoms with increase in activity. Continue ASA, Lipitor. Denies chest pain 10. 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  --Stable on Breztri PTA and husband provided from home.  11. Chronic tremors: Managed with inderal and Lamictal--worse in am and gets better as day progresses.   12.  Perforated gastric ulcer s/p repair 07/2021: On PPI 13. OSA: Nocturnal oxygen in the  past. Will order prn.          - 10/3 pt refusing CPAP as no longer needs OP s/p weight loss. Will DC 14. Dysphagia/Cough: on D2 nectar thick liquids. Educated patient and husband on aspiration risk and need for nectar liquids.              --flutter valve added.             --check CXR - 9/29 no acute cardiopulmonary process  -Upgraded to DYS 3 diet 9/29  15. Blurred vision - added PRN eye drops. - improved              - Encouraged work with therapies.               - Can consider OP neuroptho eval if persistent.   16. Urinary urgency              - Start ditropan 2.5 mg TID 10/4; monitor closely for anticholinergic s/s given high medication burden               -  PVR Qshift for 2 days to ensure no retention with new medication LOS: 6 days A FACE TO [place] 12/31/2021, 12:31 PM

## 2021-12-31 NOTE — Progress Notes (Signed)
Writer was notified by Danella Sensing, NP and updates on currents VS and status. Patient denies pain and was assisted up to [facility],, tolerated well,.Patient was informed of update ir/t IVF @ 30cc/hr and assigned RN.

## 2021-12-31 NOTE — Consult Note (Signed)
Neuropsychological Consultation   Patient:   Lauren [name]   DOB:   23-Mar-1951  MR Number:  [id number]  Location:  [place] [address] [facility] [address] [place] [id number] [place] [address] Dept: [place]: [phone]           Date of Service:   12/31/2022  Start Time:   1 PM End Time:   2 PM  Provider/Observer:  Ilean Skill, Psy.D.       Clinical Neuropsychologist       Billing Code/Service: [phone]  Chief Complaint:    Lauren [name] is a 71 year old-year-old female with past medical history including CAD, hypertension, type 2 diabetes, RML lung CA, left frontoparietal ICH 5/23 with residual right-sided lower extremity weakness and falls, BUE/head tremors with previous diagnosis of essential tremor, bipolar disorder.  Patient was admitted to [facility] on 12/20/2021 after falling out of bed with left-sided weakness and bleeding from her mouth.  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  CT head was positive for right MCA grip to tonic hemorrhagic stroke with hyperdensity seen in right MCA and CTA and was positive for emergent LVO right MCA mid M1 segment with pseudonormalization of right MCA infarct.  MRI brain showed moderate right MCA infarct with trace petechial hemorrhage right caudate chronic hemosiderin with encephalomalacia noted in posterior left superior frontal gyrus likely residual of previous stroke.  Patient with longstanding issues with memory and learning in a prior history of bipolar disorder that has been managed on medications.  Patient continues to take BuSpar and Lamictal for mood stability and Seroquel at night to aid with sleep and maintain mood stability.  Reason for Service:  Patient was referred for neuropsychological consultation due to coping and adjustment concerns with now her second cerebrovascular event since May.  Below is the HPI for  the current admission.  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  CT head was positive for right MCA toyed cytotoxic edema with hyperdense right MCA and CTA was positive for emergent LVO R-MCA mid M1 segment with pseudonormalization of right MCA infarct.  Also noted to have 64% R-ICA and 72% L-SA stenosis, LUL indolent lung cancer that was stable compared with CT 07/30/2021 note of emphysema.  MRI brain showed moderate right MCA infarct with trace petechial hemorrhage right caudate chronic hemosiderin with encephalomalacia posterior lateral left superior frontal gyrus (new since last year) she was started on hypertonic saline and ASA added for stroke due to intracranial large vessel atherosclerosis and uncontrolled risk factors. Per records, patient had tested positive for COVID on Sunday 09/17 and husband was sick prior to patient and no quarantine needed.   CT cervical spine done for work-up and showed healed old left C1 ring and left C6 and C7 fractures.  MRI brain done with contrast due to concerns of metastasis and showed no pathological enhancement.  2D echo showed EF 50 to 55% mild LVH.  Follow-up CT head 09/25 showed stable MCA infarct with evolving edema and mild mass effect.  Hypertonic saline was  weaned off.  Speech therapy following for diet tolerance and this was slowly advanced to dysphagia 3, nectar liquids.    Dr. Leonie Man felt that she would likely need DAPT with aggressive risk factor modification at discharge and has discussed this with husband. PT/OT has been working with patient who continues to be limited by left  sided weakness, balance deficits, SOB with ambulation, anxiety and impulsivity. CIR recommended due to functional decline.    Per her sister, she's taken scheduled 1x/day inhaler at bedside ~ 3x feeling SOB, not realizing inappropriate. She also notes she's confused and pocketing on L side.  Pt even says "I don't comprehend everything that people say".  Also "I Have to talk my L hand into moving".  LBM this AM.  "Always had STM deficits"- is chronic.   Current Status:  Patient was awake and alert with noted dysarthria.  Patient was animated throughout and openly described her difficulties with mood stability through the years.  Patient clearly displayed short-term memory issues and would turn to her husband to facilitate recall of specific information.  They were often a disagreement over specific facts.  Patient reports that her mood has been generally stable and has been concurs that she is doing relative to good baseline currently with regard to mood stability.  Patient is frustrated by significant change in motor function and worsening of expressive language capacity secondary to motor changes.  Patient denies any significant depression or anxiety and denies that her mood status is having any type of significant negative impact on her ability to fully participate in therapeutic efforts.  Behavioral Observation: [place] Crance  presents as a 71 y.o.-year-old Right handed Caucasian Female who appeared her stated age. her dress was Appropriate and she was Well Groomed and her manners were Appropriate to the situation.  her participation was indicative of Appropriate behaviors.  There were physical disabilities noted.  she displayed an appropriate level of cooperation and motivation.     Interactions:    Active Intrusive and Redirectable  Attention:   abnormal and attention span appeared shorter than expected for age  Memory:   abnormal; remote memory intact, recent memory  impaired  Visuo-spatial:  not examined  Speech (Volume):  normal  Speech:   garbled; slurred  Thought Process:  Coherent  Though Content:  WNL; not suicidal and not homicidal  Orientation:   person, place, time/date, and situation  Judgment:   Fair  Planning:   Poor  Affect:    Excited and Labile  Mood:    Euthymic  Insight:   Fair  Intelligence:   Right legnormal   Medical History:   Past Medical History:  Diagnosis Date   Anxiety    Bipolar 1 disorder ([place])    Bipolar disorder ([place])    Breast cancer ([place])    Breast cancer ([place])    Bronchitis    CAD (coronary artery disease)    DDD (degenerative disc disease), cervical    Diabetes mellitus without complication ([place])    Emphysema lung ([place])    Hypertension    ICH (intracerebral hemorrhage) ([place])    Lung cancer ([place])    Occasional tremors    Parotid mass    PUD (peptic ulcer disease)          Patient Active Problem List   Diagnosis Date Noted   Acute ischemic right MCA stroke ([place]) 12/25/2021   Acute ischemic stroke ([place]) 12/20/2021   RightPsychiatric History:  Patient with longstanding history of mood disturbance  with previous diagnosis of bipolar affective disorder.  Family Med/Psych History:  Family History  Problem Relation Age of Onset   Diabetes Mother    Tremor Sister    Diabetes Brother     Impression/DX:  Calianna Kim is a 71 year old-year-old female with past medical history including CAD, hypertension, type 2 diabetes, RML lung CA, left frontoparietal ICH 5/23 with residual right-sided lower extremity weakness and falls, BUE/head tremors with previous diagnosis of essential tremor, bipolar disorder.  Patient was admitted to [facility] on 12/20/2021 after falling out of bed with left-sided weakness and bleeding from her mouth.  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  CT head was positive for right MCA grip to tonic hemorrhagic stroke  with hyperdensity seen in right MCA and CTA and was positive for emergent LVO right MCA mid M1 segment with pseudonormalization of right MCA infarct.  MRI brain showed moderate right MCA infarct with trace petechial hemorrhage right caudate chronic hemosiderin with encephalomalacia noted in posterior left superior frontal gyrus likely residual of previous stroke.  Patient with longstanding issues with memory and learning in a prior history of bipolar disorder that has been managed on medications.  Patient continues to take BuSpar and Lamictal for mood stability and Seroquel at night to aid with sleep and maintain mood stability.  Patient was awake and alert with noted dysarthria.  Patient was animated throughout and openly described her difficulties with mood stability through the years.  Patient clearly displayed short-term memory issues and would turn to her husband to facilitate recall of specific information.  They were often a disagreement over specific facts.  Patient reports that her mood has been generally stable and has been concurs that she is doing relative to good baseline currently with regard to mood stability.  Patient is frustrated by significant change in motor function and worsening of expressive language capacity secondary to motor changes.  Patient denies any significant depression or anxiety and denies that her mood status is having any type of significant negative impact on her ability to fully participate in therapeutic efforts.  Disposition/Plan:  Worked on coping and adjustment issues with ongoing residual effects of her recent CVA with recent past CVA.  Patient with ongoing issues with attention and concentration, impulsivity and memory difficulties primarily short-term in nature.  However, given the patient's past history and reports of longstanding short-term memory issues it is hard to accurately gauge and address how much of her memory issues are new versus longstanding baseline  features.           Electronically Signed   _______________________ Ilean Skill, Psy.D. Clinical Neuropsychologist

## 2022-01-01 NOTE — Progress Notes (Signed)
Physical Therapy Weekly Progress Note  Patient Details  Name: Lauren [name] MRN: [id number] Date of Birth: [birth year]  Beginning of progress report period: December 26, 2021 End of progress report period: January 01, 2022  Today's Date: 01/01/2022 PT Individual Time: 5784-6962 PT Individual Time Calculation (min): 44 min   Patient has met 2 of 3 short term goals.  Pt is progressing well toward mobility goals, consistently performing bed mobility at supervision level, transfers at [facility] to [facility] level, and independence with ambulation improving. Pt is able to ambulate with CGA at times but still has lapses in attention and safety awareness, with associated balance impairments, and can require mod to maxA to prevent falls. Pt will benefit from family ed prior to DC.  Patient continues to demonstrate the following deficits muscle weakness, decreased cardiorespiratoy endurance, decreased coordination and decreased motor planning, decreased attention to left, and decreased standing balance, decreased postural control, hemiplegia, and decreased balance strategies and therefore will continue to benefit from skilled PT intervention to increase functional independence with mobility.  Patient progressing toward long term goals..  Continue plan of care.  PT Short Term Goals Week 1:  PT Short Term Goal 1 (Week 1): Pt will perform bed mobility with CGA PT Short Term Goal 1 - Progress (Week 1): Met PT Short Term Goal 2 (Week 1): Pt will perform bed to chair transfer consistently with minA. PT Short Term Goal 2 - Progress (Week 1): Met PT Short Term Goal 3 (Week 1): Pt will ambulate x150' consistently with minA and LRAD. PT Short Term Goal 3 - Progress (Week 1): Progressing toward goal Week 2:  PT Short Term Goal 1 (Week 2): STGs = LTGs  Skilled Therapeutic Interventions/Progress Updates:     Pt received seated in WC and agrees to therapy. No complaint of pain but does report having a "bad day" due to  being in an argument with her husband. WC transport to gym. Pt ambulates x200' without AD, initially pt requires CGA and cues for posture and attending to L visual field. As gait distance increases, however, pt begins to have frequent LOBs to the L, reporting "burning" in her legs and requiring modA to maxA to maintain safe balance. Upon return to siting, BP assessed at 102/62. BP then checked in standing after 1 minute at 97/70. No complaints of dizziness. WC transport outside. Pt ambulates outdoors for gait training over unlevel and varying surfaces. Pt ambulates bouts of 120' and 250' with minA overall and cues to attend to L visual field and to ensure that pt has adequate step height and step length on L. WC transport back inside. Pt completes Nustep for 7:00 at workload of 5 for strength and endurance training. PT cues for hand and foot placement for optimal bocy mechanics. Pt ambulates back to room, x75', with CGA and cues for navigation. Pt completes toilet transfer with CGA and then transfers back to bed with cues for positioning. Pt left supine with alarm intact and all needs within reach.   Therapy Documentation Precautions:  Precautions Precautions: Fall Precaution Comments: watch O2 Restrictions Weight Bearing Restrictions: No   Therapy/Group: Individual Therapy  Breck Coons, PT, DPT 01/01/2022, 4:24 PM

## 2022-01-01 NOTE — Progress Notes (Signed)
Speech Language Pathology Daily Session Note  Patient Details  Name: Lauren [name] MRN: [id number] Date of Birth: [birth year]  Today's Date: 01/01/2022 SLP Individual Time: 1420-1500 SLP Individual Time Calculation (min): 40 min  Short Term Goals: Week 1: SLP Short Term Goal 1 (Week 1): Patient will consume current diet with minimal overt s/s of aspiration witth Mod verbal cues for use of swallowing compensatory strategies. SLP Short Term Goal 2 (Week 1): Patient will consume trials of thin liquids via cup with mod verbal cues for use of swallowing compensatory strategies and overt s/s of aspiration in less than 25% of trials. SLP Short Term Goal 3 (Week 1): Patient will attened to left field of enviornment/body during functional tasks with mod verbal cues. SLP Short Term Goal 4 (Week 1): Patient will demonstrate sustained attention to functional tasks for 10 minutes with Mod verbal cues for redirection. SLP Short Term Goal 5 (Week 1): Patient will demonstrate functional problem solving for basic and familiar tasks with Mod verbal cues.  Skilled Therapeutic Interventions: Skilled treatment session focused on cognitive goals. Upon arrival, patient had just donned her dentures and had placed the adhesive on them herself resulting in copious amounts of denture adhesive residue throughout her entire oral cavity and in her teeth.  Max verbal cues were needed for problem solving throughout task of attempting to clean her dentures as she was unable to remove them (due to the large amount of adhesive used) and was utilizing tissues which ultimately were also sticking to the adhesive inside her oral cavity. With extensive oral care and several liquid washes of nectar-thick liquids, the majority of the adhesive was removed. Throughout task, patient was impulsive and required Mod verbal cues for safety. Patient consumed nectar-thick liquids via straw with overt cough X 1 due to large sips. Recommend patient  continue current diet. Patient left upright in bed with alarm on and all needs within reach. Continue with current plan of care.      Pain No/Denies Pain   Therapy/Group: Individual Therapy  Lauren [name] 01/01/2022, 3:27 PM

## 2022-01-01 NOTE — Progress Notes (Signed)
Physical Therapy Session Note  Patient Details  Name: Lauren [name] MRN: [id number] Date of Birth: 1951/01/31  Today's Date: 01/01/2022 PT Individual Time: 0801-0900 PT Individual Time Calculation (min): 59 min   Short Term Goals: Week 1:  PT Short Term Goal 1 (Week 1): Pt will perform bed mobility with CGA PT Short Term Goal 2 (Week 1): Pt will perform bed to chair transfer consistently with minA. PT Short Term Goal 3 (Week 1): Pt will ambulate x150' consistently with minA and LRAD.  Skilled Therapeutic Interventions/Progress Updates: Pt presents supine in bed and agreeable to therapy.  Pt transfers to long sitting w/ supervision.  Pt donned scrub pants and piull-over shirt w/ set-up, verbal cues for sequencing.  Pt transfers to EOB w/ supervision to don shoes at EOB w/ supervision.  Pt states fatigue but no c/o  BP monitored at bedside at 104/69.  Pt transferred sit to stand w/ CGA, but impulsively.  BP 93/58 (MAP 70).  Pt amb to BR w/o AD and mod A.  Ptcontinent of bladder, nursing to chart, and then CGA for clothing management.  Pt amb to w/c and discussed plan for therapy while eats breakfast.  Pt w/ distractions, increased LOB.  Pt amb x 150' w/o AD and fluctuated mod to min A w/ distractions and noted stutter step LLE.  Pt attempted w/ 3# weights but pt does not like.  Pt amb w/ marching LLE, but unless concentrating, pt tends to scissor especially w/ turns.  Pt returned to room and remained sitting in w/c w/spouse present.  Chair alarm on and all needs in reach.     Therapy Documentation Precautions:  Precautions Precautions: Fall Precaution Comments: watch O2 Restrictions Weight Bearing Restrictions: No General:   Vital Signs:  Pain:0/10 Pain Assessment Pain Scale: 0-10 Pain Score: 0-No pain      Therapy/Group: Individual Therapy  Lauren [name] 01/01/2022, 12:08 PM

## 2022-01-01 NOTE — Progress Notes (Signed)
PROGRESS NOTE   Subjective/Complaints:  Pt seen at bedside. In good spirits today. Working with rec therapy, discussing home crafts which she enjoys and keeps her busy.   Much improved urinary frequency, endorses only up 2x overnight to urinate. No s/e endorsed. Remains borderline hypotensive but asymptomatic.   Vision is somewhat improved, but she states she has to close her right eye to read. She has not discussed this with her therapies.   ROS: Denies fevers, HA, hills, N/V, abdominal pain, constipation, diarrhea, SOB, cough, chest pain, new weakness or paraesthesias.    Objective:   No results found. No results for input(s): "WBC", "HGB", "HCT", "PLT" in the last 72 hours.  No results for input(s): "NA", "K", "CL", "CO2", "GLUCOSE", "BUN", "CREATININE", "CALCIUM" in the last 72 hours.   Intake/Output Summary (Last 24 hours) at 01/01/2022 1011 Last data filed at 01/01/2022 0915 Gross per 24 hour  Intake 878.49 ml  Output --  Net 878.49 ml         Physical Exam: Vital Signs Blood pressure (!) 96/59, pulse 64, temperature 98.3 F (36.8 C), temperature source Oral, resp. rate 18, height 5' (1.524 m), weight 42.7 kg, SpO2 95 %. Constitutional: No apparent distress. Appropriate appearance for age.  HENT: No JVD. Neck Supple. Trachea midline. Atraumatic, normocephalic. +glasses Eyes: PERRLA. EOMI. Visual fields grossly intact.  Cardiovascular: RRR, no murmurs/rub/gallops. No Edema. Peripheral pulses 2+  Respiratory: CTAB. No rales, rhonchi, or wheezing. On RA. Good air movement.   Abdomen: + bowel sounds, normoactive. No distention or tenderness.  Skin: Mild DTI over sacrum, not examined today   MSK:      No apparent deformity.       Strength: Moving all 4 limbs antigravity and against resistance.   Neurologic exam:  Cognition: AAO to person, place, time and event. + L inattention, mild Language: Fluent, No  substitutions or neoglisms.  Mood: Mildly pressured speech and impulsivity. Improved.  CN: +L facial droop, otherwise intact.    PE from prior encounters: RUE 5/5 RLE- HF 4+/5; KE 5-/5; DF and PF 5/5 LUE- Biceps 4/5; Triceps 4/5; WE 4+/5; Grip 4+/5; and FA 3+/5 LLE- HF 4/5; KE 4+/5; DF/PF 4+/5        Assessment/Plan: 1. Functional deficits which require 3+ hours per day of interdisciplinary therapy in a comprehensive inpatient rehab setting. Physiatrist is providing close team supervision and 24 hour management of active medical problems listed below. Physiatrist and rehab team continue to assess barriers to discharge/monitor patient progress toward functional and medical goals  Care Tool:  Bathing    Body parts bathed by patient: Right arm, Left arm, Chest, Abdomen, Front perineal area, Buttocks, Right upper leg, Left upper leg, Face   Body parts bathed by helper: Right lower leg, Left lower leg     Bathing assist Assist Level: Minimal Assistance - Patient > 75%     Upper Body Dressing/Undressing Upper body dressing   What is the patient wearing?: Pull over shirt    Upper body assist Assist Level: Minimal Assistance - Patient > 75%    Lower Body Dressing/Undressing Lower body dressing      What is the patient wearing?:  Underwear/pull up, Pants     Lower body assist Assist for lower body dressing: Minimal Assistance - Patient > 75%     Toileting Toileting    Toileting assist Assist for toileting: Minimal Assistance - Patient > 75%     Transfers Chair/bed transfer  Transfers assist     Chair/bed transfer assist level: Minimal Assistance - Patient > 75%     Locomotion Ambulation   Ambulation assist      Assist level: Moderate Assistance - Patient 50 - 74% Assistive device: No Device Max distance: 150'   Walk 10 feet activity   Assist     Assist level: Moderate Assistance - Patient - 50 - 74% Assistive device: No Device   Walk 50 feet  activity   Assist    Assist level: Moderate Assistance - Patient - 50 - 74% Assistive device: No Device    Walk 150 feet activity   Assist    Assist level: Moderate Assistance - Patient - 50 - 74% Assistive device: No Device    Walk 10 feet on uneven surface  activity   Assist     Assist level: Moderate Assistance - Patient - 50 - 74% Assistive device:  (R handrail)   Wheelchair     Assist Is the patient using a wheelchair?: Yes Type of Wheelchair: Manual    Wheelchair assist level: Total Assistance - Patient < 25% Max wheelchair distance: 150'    Wheelchair 50 feet with 2 turns activity    Assist        Assist Level: Total Assistance - Patient < 25%   Wheelchair 150 feet activity     Assist      Assist Level: Total Assistance - Patient < 25%   Blood pressure (!) 96/59, pulse 64, temperature 98.3 F (36.8 C), temperature source Oral, resp. rate 18, height 5' (1.524 m), weight 42.7 kg, SpO2 95 %.  Medical Problem List and Plan: 1. Functional deficits secondary to R MCA stroke with L sensory deficits and L hemiparesis as well as dysphagia and L inattention             -patient may  shower             -ELOS/Goals: 10-14 days- supervision maybe min A due to sensory deficits  -Continue CIR with PT/OT/SLP  2.  Antithrombotics: -DVT/anticoagulation:  Pharmaceutical: Lovenox             -antiplatelet therapy: ASA. Per DC, "She has been started on only aspirin 81 mg p.o. daily instead of DAPT due to previous history of large ICH/hemorrhagic stroke." Can address with Neuro at OP f/u.   3. DDD Cervical spine/Pain Management: tylenol prn.   4. Mood/Behavior/Sleep:  LCSW to follow for evaluation and support.              - Hx  ADHD: On Adderall daily.              - Hx Bipolar disorder: Stable on home regimen of Lamictal, Seroquel and buspar for anxiety  - ?mania component w/ impulsivity; per family somewhat baseline but behaviors have worsened s/p  strokes              -antipsychotic agents:  Continue              - neuropsych consult next week if appropriate - performed 10/4, no new recs            - 9/30: Fall OOB, noted impulsive behaviors  and poor safety awareness, however no further tele available in hospital; nursing to place belt alarm in bed             - 10/4 DC scheduled atarax d/t dry mouth, not home med. PRN available and not used.   5. Neuropsych/cognition: This patient may be intermittently capable of making decisions on her own behalf. 6. Skin/Wound Care: Routine pressure relief measures.           - Sacral DTI - Sacral heart, change PRN  7. Fluids/Electrolytes/Nutrition: Monitor I/O. Check CMET in am.    Latest Ref Rng & Units 12/29/2021    6:55 AM 12/26/2021    6:31 AM 12/25/2021    5:51 AM  BMP  Glucose 70 - 99 mg/dL 87  86  103   BUN 8 - 23 mg/dL 13  13  11    Creatinine 0.44 - 1.00 mg/dL 0.62  0.61  0.43   Sodium 135 - 145 mmol/L 140  140  140   Potassium 3.5 - 5.1 mmol/L 3.8  4.2  3.9   Chloride 98 - 111 mmol/L 105  105  105   CO2 22 - 32 mmol/L 28  30  26    Calcium 8.9 - 10.3 mg/dL 8.8  8.9  8.9      8. HTN: Monitor BP TID, well controlled    01/01/2022    3:59 AM 12/31/2021    7:36 PM 12/31/2021    1:11 PM  Vitals with BMI  Systolic 96 326 712  Diastolic 59 77 66  Pulse 64 61 70  - 10/4 IVF overnight for asymptomatic hypotension 70/40; improved this AM, DC maintenance fluids.   - 10/5 - improved; monitor  9. CAD: Monitor for symptoms with increase in activity. Continue ASA, Lipitor. Denies chest pain 10. 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  --Stable on Breztri PTA and husband provided from home.  11. Chronic tremors: Managed with inderal and Lamictal--worse in am and gets better as day progresses.  12.  Perforated gastric ulcer s/p repair 07/2021: On PPI 13. OSA: Nocturnal oxygen in the  past. Will order prn.          - 10/3 pt refusing CPAP as  no longer needs OP s/p weight loss. Will DC 14. Dysphagia/Cough: on D2 nectar thick liquids. Educated patient and husband on aspiration risk and need for nectar liquids.              --flutter valve added.             --check CXR - 9/29 no acute cardiopulmonary process  -Upgraded to DYS 3 diet 9/29  15. Blurred vision - added PRN eye drops. - improved              - Encouraged work with therapies - OT notified              - Can consider OP neuroptho eval if persistent.    16. Urinary urgency              - Start ditropan 2.5 mg TID 10/4 - much improved              - PVR Qshift for 2 days to ensure no retention with new medication LOS: 7 days A FACE TO FACE EVALUATION WAS PERFORMED  Gertie Gowda 01/01/2022, 10:11 AM

## 2022-01-01 NOTE — Progress Notes (Addendum)
Occupational Therapy Weekly Progress Note  Patient Details  Name: Lauren [name] MRN: [id number] Date of Birth: 17-Aug-1950  Beginning of progress report period: December 26, 2021 End of progress report period: January 01, 2022  Today's Date: 01/01/2022 OT Individual Time: 6720-9470 OT Individual Time Calculation (min): 59 min    Patient has met 3 of 3 short term goals.  Patient is making steady progress towards OT goals. She has demonstrated improved balance, safety awareness, and attention to L side, although she still has deficits in these areas. Pt is still very apraxic within functional tasks and needs min to moderate cues for sequencing and problem solving functional tasks at times. Pt currently can be as little as CGA, but still has LOB's and poor reaction time requiring min A. Continue current POC.  Patient continues to demonstrate the following deficits: muscle weakness, decreased cardiorespiratoy endurance, impaired timing and sequencing, unbalanced muscle activation, motor apraxia, ataxia, decreased coordination, and decreased motor planning, decreased attention to left, decreased motor planning, and ideational apraxia, decreased initiation, decreased attention, decreased awareness, decreased problem solving, decreased safety awareness, and decreased memory, and decreased standing balance and decreased postural control and therefore will continue to benefit from skilled OT intervention to enhance overall performance with BADL.  Patient progressing toward long term goals..  Continue plan of care.  OT Short Term Goals Week 1:  OT Short Term Goal 1 (Week 1): Patient will use L UE to self feed with min A OT Short Term Goal 1 - Progress (Week 1): Met OT Short Term Goal 2 (Week 1): Patient will complete 2 steps of toileting with min A OT Short Term Goal 2 - Progress (Week 1): Met OT Short Term Goal 3 (Week 1): Patient will locate 2 items on L side of sink with min questioning cues. OT  Short Term Goal 3 - Progress (Week 1): Met Week 2:  OT Short Term Goal 1 (Week 2): LTG=STG 2/2 ELOS  Skilled Therapeutic Interventions/Progress Updates:    Pt greeted seated in wc and agreeable to OT treatment session. Family also present. OT educated daughter on bathroom transfers, gait belt, safety awareness, and how to cue pt to attend to L side. Pt reported she would like to shower. Pt needed min cues for pathfinding and finding seat in shower to sit on. Bathinng from shower seat with pt able to get L UE to head today to wash hair. Pt needed mod cues to recall hemi dressing techniques and unaware she did not even dress her L side. Blocked practice for UB dressing 3x. LB dressing with increased time, min cues and min A. Pt ambulated to therapy gym without AD and min A with 1 overt LOB requiring mod A to  correct. Used BITS to address L attention and functional use of L UE. Encouraged finger isolation on L hand. Pt with improved reaction time to L with repetition. Pt ambulated back to room in similar fashion and left seated in wc with alarm belt on, family present, and needs met.   Therapy Documentation Pain: Pain Assessment Pain Scale: 0-10 Pain Score: 0-No pain   Therapy/Group: Individual Therapy  Valma Cava 01/01/2022, 10:56 AM

## 2022-01-02 DIAGNOSIS — Z8659 Personal history of other mental and behavioral disorders: Secondary | ICD-10-CM

## 2022-01-02 NOTE — Progress Notes (Signed)
Speech Language Pathology Weekly Progress and Session Note  Patient Details  Name: Lauren [name] MRN: [id number] Date of Birth: Feb 22, 1951  Beginning of progress report period: December 25, 2021 End of progress report period: January 02, 2022  Today's Date: 01/02/2022 SLP Individual Time: 8099-8338 SLP Individual Time Calculation (min): 40 min  Short Term Goals: Week 1: SLP Short Term Goal 1 (Week 1): Patient will consume current diet with minimal overt s/s of aspiration witth Mod verbal cues for use of swallowing compensatory strategies. SLP Short Term Goal 1 - Progress (Week 1): Met SLP Short Term Goal 2 (Week 1): Patient will consume trials of thin liquids via cup with mod verbal cues for use of swallowing compensatory strategies and overt s/s of aspiration in less than 25% of trials. SLP Short Term Goal 2 - Progress (Week 1): Not met SLP Short Term Goal 3 (Week 1): Patient will attened to left field of enviornment/body during functional tasks with mod verbal cues. SLP Short Term Goal 3 - Progress (Week 1): Met SLP Short Term Goal 4 (Week 1): Patient will demonstrate sustained attention to functional tasks for 10 minutes with Mod verbal cues for redirection. SLP Short Term Goal 4 - Progress (Week 1): Met SLP Short Term Goal 5 (Week 1): Patient will demonstrate functional problem solving for basic and familiar tasks with Mod verbal cues. SLP Short Term Goal 5 - Progress (Week 1): Met    New Short Term Goals: Week 2: SLP Short Term Goal 1 (Week 2): STGs=LTGs due to ELOS  Weekly Progress Updates: Patient has made functional gains and has met 4 of 5 STGs this reporting period. Currently, patient is consuming Dys. 3 textures with nectar-thick liquids with intermittent overt s/s of aspiration and requires Mod A verbal cues for use of swallowing compensatory strategies due to impulsivity and decreased attention. Patient has been participating in trials of thin liquids with overt s/s of  aspiration observed due to large bolus size and decreased oral control. Recommend patient continue current diet with ongoing trials with SLP.  Patient also continues to demonstrate moderate cognitive impairments and requires moderate verbal cues to complete functional and familiar tasks safely in regards to problem solving, sustained attention and attention to left field of environment/body. Mod-Max verbal cues are needed for recall of daily information due to baseline deficits that are now exacerbated. Patient and family education ongoing. Patient would benefit from continued skilled SLP intervention to maximize her swallowing and cognitive functioning prior to discharge.      Intensity: Minumum of 1-2 x/day, 30 to 90 minutes Frequency: 3 to 5 out of 7 days Duration/Length of Stay: 01/09/22 Treatment/Interventions: Cognitive remediation/compensation;Dysphagia/aspiration precaution training;Internal/external aids;Speech/Language facilitation;Therapeutic Activities;Environmental controls;Cueing hierarchy;Functional tasks;Patient/family education   Daily Session  Skilled Therapeutic Interventions:  Skilled treatment session focused on dysphagia and cognitive goals. Upon arrival, patient was awake while upright in her wheelchair consuming her breakfast meal of Dys. 3 textures with nectar-thick liquids. Patient consumed meal with Min verbal cues needed for use of small bites and to self-monitor and correct oral residue. Patient with overt cough X 1, suspect due to bolus size. Recommend patient continue current diet. Patient requested to use the bathroom and was continent of bladder with supervision verbal cues for safety. Patient also performed oral care with Min-Mod verbal cues needed for problem solving and attention throughout task. Patient perseverative on her husband not being present and requested to use her cell phone. Patient utilized her phone independently. Patient left talking on the phone  with  alarm on and all needs within reach. Continue with current plan of care.     Pain No/Denies Pain   Therapy/Group: Individual Therapy  Odai Wimmer 01/02/2022, 6:15 AM

## 2022-01-02 NOTE — Progress Notes (Signed)
Occupational Therapy Session Note  Patient Details  Name: Lauren [name] MRN: [id number] Date of Birth: [birth year]  Today's Date: 01/02/2022 OT Individual Time: 1020-1100 OT Individual Time Calculation (min): 40 min   Short Term Goals: Week 2:  OT Short Term Goal 1 (Week 2): LTG=STG 2/2 ELOS  Skilled Therapeutic Interventions/Progress Updates:    Pt greeted in wc and agreeable to OT treatment session. Pt ambulated to dayroom without Ad and min A with 3 standing rest breaks.   Visual convergence with Lauren [name] convergence Insufficiency activity.    Pt was able to focus on green bead, jump to yellow, then to red the furthest away and still be able to converge. Pt completed this exercise multiple times, then vision assessed further. Pt with some lag in persuits on R eye, but overall she was able to read larger and smaller text.   Reading is very important to patient as she normally reads 3-5 books a week on her [place].    Feel patient is squinting her R eye due to visual fatigue. She was able to read with both eyes open accurately. Feel patients reading difficulty is due to her attention deficits and difficulty comprehending her reading. Pt is also loosing the text on her [place]. OT suggested pt try reading a paper book with visual strategies to keep her from visually loosing the text.   Pt ambulated back to room in similar fashion and left seated in wc with spouse present and needs met.   Therapy Documentation Precautions:  Precautions Precautions: Fall Precaution Comments: watch O2 Restrictions Weight Bearing Restrictions: No Pain: Pain Assessment Pain Scale: 0-10 Pain Score: 0-No pain     Therapy/Group: Individual Therapy  Valma Cava 01/02/2022, 4:09 PM

## 2022-01-02 NOTE — Progress Notes (Signed)
Occupational Therapy Session Note  Patient Details  Name: Tamber Burtch MRN: [id number] Date of Birth: March 07, 1951  Today's Date: 01/02/2022 OT Individual Time: 1341-1441 OT Individual Time Calculation (min): 60 min    Short Term Goals: Week 2:  OT Short Term Goal 1 (Week 2): LTG=STG 2/2 ELOS  Skilled Therapeutic Interventions/Progress Updates:  Pt greeted seated in w/c, pt agreeable to OT. Pt transported to gym in w/c with total A. Session focus on LUE [facility] including various therapeutic activities to challenge visual scanning, L sided attention and [place]. First pt completed seated beading activity where pt instructed to use LUE to collects beads and RUE to hold string however with pt with difficulty with command following seeming to complete task in opposite manner that therapist suggesting with pt using RUE to pick up beads and thread them vs LUE. Pt was able to complete task correctly with MAX step by step cues.  Next, pt was instructed to duplicate shape pattern with pt only using LUE to create pattern to challenge LUE [facility] and L sided attention, pt needed + time to complete this task, with noted impaired precision with task but did complete 3/3 patterns with 100% accuracy to recreate shapes.   Pt also work on seated [facility] task usign peg board to recreate structure with visual aid provided with an emphasis on pincer grasp, graded task up and had pt using tweezers to grasp pegs with this being too challenging for pt. Pt preferred to use pincer grasp, pt needed + time to complete task but completed task with supervision, pt noted to drop pegs frequently d/t impaired FMC/motor planning.  Pt transported back to room in w/c with total A where pt completed ambulatory toilet transfer with MIN  HHA, pt completed 3/3 toileting tasks with CGA. Pt left supine in bed with all needs within reach.               Therapy Documentation Precautions:  Precautions Precautions: Fall Precaution Comments: watch  O2 Restrictions Weight Bearing Restrictions: No  Pain: no pain   Therapy/Group: Individual Therapy  Precious Haws 01/02/2022, 3:53 PM

## 2022-01-02 NOTE — Consult Note (Signed)
Neuropsychological Consultation   Patient:   Lauren [name]   DOB:   01/04/1951  MR Number:  [id number]  Location:  [facility] [address] C[facility] [address] [place] [id number] [place] [address] Dept: Blank[place]: [phone]           Date of Service:   01/02/2022  Start Time:   9 AM End Time:   10 AM  Provider/Observer:  Ilean Skill, Psy.D.       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  Patient was admitted to [facility] on 12/20/2021 after falling out of bed with left-sided weakness and bleeding from her mouth.  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  CT head was positive for right MCA grip to tonic hemorrhagic stroke with hyperdensity seen in right MCA and CTA and was positive for emergent LVO right MCA mid M1 segment with pseudonormalization of right MCA infarct.  MRI brain showed moderate right MCA infarct with trace petechial hemorrhage right caudate chronic hemosiderin with encephalomalacia noted in posterior left superior frontal gyrus likely residual of previous stroke.  Patient with longstanding issues with memory and learning in a prior history of bipolar disorder that has been managed on medications.  Patient continues to take BuSpar and Lamictal for mood stability and Seroquel at night to aid with sleep and maintain mood stability.  Reason for Service:  Patient was referred for neuropsychological consultation due to coping and adjustment concerns with now her second cerebrovascular event since May.  Below is the HPI  for the current admission.  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  CT head was positive for right MCA toyed cytotoxic edema with hyperdense right MCA and CTA was positive for emergent LVO R-MCA mid M1 segment with pseudonormalization of right MCA infarct.  Also noted to have 64% R-ICA and 72% L-SA stenosis, LUL indolent lung cancer that was stable compared with CT 07/30/2021 note of emphysema.  MRI brain showed moderate right MCA infarct with trace petechial hemorrhage right caudate chronic hemosiderin with encephalomalacia posterior lateral left superior frontal gyrus (new since last year) she was started on hypertonic saline and ASA added for stroke due to intracranial large vessel atherosclerosis and uncontrolled risk factors. Per records, patient had tested positive for COVID on Sunday 09/17 and husband was sick prior to patient and no quarantine needed.   CT cervical spine done for work-up and showed healed old left C1 ring and left C6 and C7 fractures.  MRI brain done with contrast due to concerns of metastasis and showed no pathological enhancement.  2D echo showed EF 50 to 55% mild LVH.  Follow-up CT head 09/25 showed stable MCA infarct with evolving edema and mild mass effect.  Hypertonic saline was  weaned off.  Speech therapy following for diet tolerance and this was slowly advanced to dysphagia 3, nectar liquids.    Dr. Leonie Man felt that she would likely need DAPT with aggressive risk factor modification at discharge and has discussed this with husband. PT/OT has been working with patient who continues to be limited by left  sided weakness, balance deficits, SOB with ambulation, anxiety and impulsivity. CIR recommended due to functional decline.    Per her sister, she's taken scheduled 1x/day inhaler at bedside ~ 3x feeling SOB, not realizing inappropriate. She also notes she's confused and pocketing on L side.  Pt even says "I don't comprehend everything that people say".  Also "I Have to talk my L hand into moving".  LBM this AM.  "Always had STM deficits"- is chronic.   Current Status:  Patient was awake and alert with husband present as I entered the room.  She was having a breathing treatment.  Patient was awake and alert with continued motor deficits and dysarthria due to motor deficits not to word finding difficulties or other aspects of aphasia.  Patient reports that her mood is continue to be stable.  Patient had numerous questions about her status and plans going forward and expectation for pattern of recovery and I answered the best I could and qualified those answers are appropriate.  Patient's husband did have to redirect the patient on a number of occasions.  Patient reports that she continues with stable mood and continues with her historic medications of Lamictal and BuSpar.  Behavioral Observation: Lauren [name]  presents as a 71 y.o.-year-old Right handed Caucasian Female who appeared her stated age. her dress was Appropriate and she was Well Groomed and her manners were Appropriate to the situation.  her participation was indicative of Appropriate behaviors.  There were physical disabilities noted.  she displayed an appropriate level of cooperation and motivation.     Interactions:    Active Intrusive and Redirectable  Attention:   abnormal and attention span appeared shorter than expected for age  Memory:   abnormal; remote memory intact, recent memory impaired  Visuo-spatial:  not examined  Speech (Volume):  normal  Speech:   garbled; slurred  Thought Process:  Coherent  Though  Content:  WNL; not suicidal and not homicidal  Orientation:   person, place, time/date, and situation  Judgment:   Fair  Planning:   Poor  Affect:    Excited and Labile  Mood:    Euthymic  Insight:   Fair  Intelligence:   Right legnormal   Medical History:   Past Medical History:  Diagnosis Date   Anxiety    Bipolar 1 disorder ([place])    Bipolar disorder ([place])    Breast cancer ([place])    Breast cancer ([place])    Bronchitis    CAD (coronary artery disease)    DDD (degenerative disc disease), cervical    Diabetes mellitus without complication ([place])    Emphysema lung ([place])    Hypertension    ICH (intracerebral hemorrhage) ([place])    Lung cancer ([place])    Occasional tremors    Parotid mass    PUD (peptic ulcer disease)          Patient Active Problem List   Diagnosis Date Noted   History of bipolar disorder    Acute ischemic right MCA stroke ([place]) 12/25/2021   Acute ischemic stroke ([place]) 12/20/2021   RightPsychiatric History:  Patient with longstanding history of mood disturbance with previous diagnosis  of bipolar affective disorder.  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  Patient was admitted to [facility] on 12/20/2021 after falling out of bed with left-sided weakness and bleeding from her mouth.  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  CT head was positive for right MCA grip to tonic hemorrhagic stroke with hyperdensity seen in right MCA and CTA and was positive for emergent LVO right MCA mid M1 segment with  pseudonormalization of right MCA infarct.  MRI brain showed moderate right MCA infarct with trace petechial hemorrhage right caudate chronic hemosiderin with encephalomalacia noted in posterior left superior frontal gyrus likely residual of previous stroke.  Patient with longstanding issues with memory and learning in a prior history of bipolar disorder that has been managed on medications.  Patient continues to take BuSpar and Lamictal for mood stability and Seroquel at night to aid with sleep and maintain mood stability.  Patient was awake and alert with husband present as I entered the room.  She was having a breathing treatment.  Patient was awake and alert with continued motor deficits and dysarthria due to motor deficits not to word finding difficulties or other aspects of aphasia.  Patient reports that her mood is continue to be stable.  Patient had numerous questions about her status and plans going forward and expectation for pattern of recovery and I answered the best I could and qualified those answers are appropriate.  Patient's husband did have to redirect the patient on a number of occasions.  Patient reports that she continues with stable mood and continues with her historic medications of Lamictal and BuSpar.  Disposition/Plan:  Continue to work on adjustment and coping with residual effects of recent CVA with history of underlying bipolar disorder.           Electronically Signed   _______________________ Ilean Skill, Psy.D. Clinical Neuropsychologist

## 2022-01-02 NOTE — Progress Notes (Signed)
PROGRESS NOTE   Subjective/Complaints:  Pt seen this afternoon eating lunch with husband at bedside.   No acute complaints. No events overnight. Is tolerating Ditropan well however does think she had 1 high bladder scan with nursing (only recorded 213).   ROS: Denies fevers, HA, hills, N/V, abdominal pain, constipation, diarrhea, SOB, cough, chest pain, new weakness or paraesthesias.    Objective:   No results found. No results for input(s): "WBC", "HGB", "HCT", "PLT" in the last 72 hours.  No results for input(s): "NA", "K", "CL", "CO2", "GLUCOSE", "BUN", "CREATININE", "CALCIUM" in the last 72 hours.   Intake/Output Summary (Last 24 hours) at 01/02/2022 1403 Last data filed at 01/02/2022 0700 Gross per 24 hour  Intake 598 ml  Output --  Net 598 ml         Physical Exam: Vital Signs Blood pressure 113/61, pulse 67, temperature 98.6 F (37 C), temperature source Oral, resp. rate 17, height 5' (1.524 m), weight 42.7 kg, SpO2 91 %. Constitutional: No apparent distress. Appropriate appearance for age.  HENT: No JVD. Neck Supple. Trachea midline. Atraumatic, normocephalic. +glasses Eyes: PERRLA. EOMI. Visual fields grossly intact.  Cardiovascular: RRR, no murmurs/rub/gallops. No Edema. Peripheral pulses 2+  Respiratory: CTAB. No rales, rhonchi, or wheezing. On RA. Good air movement.   Abdomen: + bowel sounds, normoactive. No distention or tenderness.  Skin: Mild DTI over sacrum   MSK:      No apparent deformity.       Strength: Moving all 4 limbs antigravity and against resistance.   Neurologic exam:  Cognition: AAO to person, place, time and event. + L inattention, mild Language: Fluent, No substitutions or neoglisms.  Mood: Mildly pressured speech and impulsivity. Improved.  CN: +L facial droop, otherwise intact.  Coordination: FTN intact bilaterally.   MSK:  RUE 5/5 RLE- HF 4+/5; KE 5-/5; DF and PF 5/5 LUE-  Biceps 4+/5; Triceps 4+/5; WE 4+/5; Grip 4+/5; and FA 4+/5 LLE- HF 4+/5; KE 5-/5; DF/PF 5-/5        Assessment/Plan: 1. Functional deficits which require 3+ hours per day of interdisciplinary therapy in a comprehensive inpatient rehab setting. Physiatrist is providing close team supervision and 24 hour management of active medical problems listed below. Physiatrist and rehab team continue to assess barriers to discharge/monitor patient progress toward functional and medical goals  Care Tool:  Bathing    Body parts bathed by patient: Right arm, Left arm, Chest, Abdomen, Front perineal area, Buttocks, Right upper leg, Left upper leg, Face   Body parts bathed by helper: Right lower leg, Left lower leg     Bathing assist Assist Level: Minimal Assistance - Patient > 75%     Upper Body Dressing/Undressing Upper body dressing   What is the patient wearing?: Pull over shirt    Upper body assist Assist Level: Minimal Assistance - Patient > 75%    Lower Body Dressing/Undressing Lower body dressing      What is the patient wearing?: Underwear/pull up, Pants     Lower body assist Assist for lower body dressing: Minimal Assistance - Patient > 75%     Toileting Toileting    Toileting assist Assist for toileting:  Minimal Assistance - Patient > 75%     Transfers Chair/bed transfer  Transfers assist     Chair/bed transfer assist level: Minimal Assistance - Patient > 75%     Locomotion Ambulation   Ambulation assist      Assist level: Moderate Assistance - Patient 50 - 74% Assistive device: No Device Max distance: 150'   Walk 10 feet activity   Assist     Assist level: Moderate Assistance - Patient - 50 - 74% Assistive device: No Device   Walk 50 feet activity   Assist    Assist level: Moderate Assistance - Patient - 50 - 74% Assistive device: No Device    Walk 150 feet activity   Assist    Assist level: Moderate Assistance - Patient - 50 -  74% Assistive device: No Device    Walk 10 feet on uneven surface  activity   Assist     Assist level: Moderate Assistance - Patient - 50 - 74% Assistive device:  (R handrail)   Wheelchair     Assist Is the patient using a wheelchair?: Yes Type of Wheelchair: Manual    Wheelchair assist level: Total Assistance - Patient < 25% Max wheelchair distance: 150'    Wheelchair 50 feet with 2 turns activity    Assist        Assist Level: Total Assistance - Patient < 25%   Wheelchair 150 feet activity     Assist      Assist Level: Total Assistance - Patient < 25%   Blood pressure 113/61, pulse 67, temperature 98.6 F (37 C), temperature source Oral, resp. rate 17, height 5' (1.524 m), weight 42.7 kg, SpO2 91 %.  Medical Problem List and Plan: 1. Functional deficits secondary to R MCA stroke with L sensory deficits and L hemiparesis as well as dysphagia and L inattention             -patient may  shower             -ELOS/Goals: 10-14 days- supervision maybe min A due to sensory deficits  -Continue CIR with PT/OT/SLP  2.  Antithrombotics: -DVT/anticoagulation:  Pharmaceutical: Lovenox             -antiplatelet therapy: ASA. Per DC, "She has been started on only aspirin 81 mg p.o. daily instead of DAPT due to previous history of large ICH/hemorrhagic stroke." Can address with Neuro at OP f/u.   3. DDD Cervical spine/Pain Management: tylenol prn.   4. Mood/Behavior/Sleep:  LCSW to follow for evaluation and support.              - Hx  ADHD: On Adderall daily.              - Hx Bipolar disorder: Stable on home regimen of Lamictal, Seroquel and buspar for anxiety  - ?mania component w/ impulsivity; per family somewhat baseline but behaviors have worsened s/p strokes              -antipsychotic agents:  Continue              - neuropsych consult next week if appropriate - performed 10/4, no new recs            - 9/30: Fall OOB, noted impulsive behaviors and poor  safety awareness, however no further tele available in hospital; nursing to place belt alarm in bed             - 10/4 DC scheduled atarax d/t  dry mouth, not home med. PRN available and not used.   5. Neuropsych/cognition: This patient may be intermittently capable of making decisions on her own behalf. 6. Skin/Wound Care: Routine pressure relief measures.           - Sacral DTI - Sacral heart, change PRN  7. Fluids/Electrolytes/Nutrition: Monitor I/O. Check CMET in am.    Latest Ref Rng & Units 12/29/2021    6:55 AM 12/26/2021    6:31 AM 12/25/2021    5:51 AM  BMP  Glucose 70 - 99 mg/dL 87  86  103   BUN 8 - 23 mg/dL 13  13  11    Creatinine 0.44 - 1.00 mg/dL 0.62  0.61  0.43   Sodium 135 - 145 mmol/L 140  140  140   Potassium 3.5 - 5.1 mmol/L 3.8  4.2  3.9   Chloride 98 - 111 mmol/L 105  105  105   CO2 22 - 32 mmol/L 28  30  26    Calcium 8.9 - 10.3 mg/dL 8.8  8.9  8.9      8. HTN: Monitor BP TID, well controlled    01/02/2022    1:30 PM 01/02/2022    3:58 AM 01/01/2022    7:35 PM  Vitals with BMI  Systolic 323 99 557  Diastolic 61 64 72  Pulse 67 68 79  - 10/4 IVF overnight for asymptomatic hypotension 70/40; improved this AM, DC maintenance fluids.   - 10/5 - improved; monitor  9. CAD: Monitor for symptoms with increase in activity. Continue ASA, Lipitor. Denies chest pain 10. 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  --Stable on Breztri PTA and husband provided from home.  11. Chronic tremors: Managed with inderal and Lamictal--worse in am and gets better as day progresses.  12.  Perforated gastric ulcer s/p repair 07/2021: On PPI 13. OSA: Nocturnal oxygen in the  past. Will order prn.          - 10/3 pt refusing CPAP as no longer needs OP s/p weight loss. Will DC 14. Dysphagia/Cough: on D2 nectar thick liquids. Educated patient and husband on aspiration risk and need for nectar liquids.              --flutter valve added.              --check CXR - 9/29 no acute cardiopulmonary process  -Upgraded to DYS 3 diet 9/29  15. Blurred vision - added PRN eye drops. - improved              - Encouraged work with therapies - OT notified              - Can consider OP neuroptho eval if persistent.    16. Urinary urgency              - Start ditropan 2.5 mg TID 10/4 - much improved              - PVR Qshift for 2 days to ensure no retention with new medication - 1x scan >200; continue to monitor LOS: 8 days A FACE TO FACE EVALUATION WAS PERFORMED  Gertie Gowda 01/02/2022, 2:03 PM

## 2022-01-02 NOTE — Progress Notes (Signed)
Physical Therapy Session Note  Patient Details  Name: Lauren [name] MRN: [id number] Date of Birth: [birth year]  Today's Date: 01/02/2022 PT Individual Time: 4098-1191 PT Individual Time Calculation (min): 43 min   Short Term Goals: Week 2:  PT Short Term Goal 1 (Week 2): STGs = LTGs  Skilled Therapeutic Interventions/Progress Updates:    Pt received sitting upright in bed with her husband, Shanon Brow, present and pt initially not agreeable to therapy session reporting she is fatigued; however, with min encouragement and redirection she agreed. Transitioned to sitting L EOB with close supervision for safety. R stand pivot EOB>w/c, no AD, with CGA for safety.  Transported to/from gym in w/c for time management and energy conservation due to pt reporting fatigue.   Sit<>stands to/from w/c with CGA for safety during session. Pt responds well to cuing during session to remain seated and wait for therapist to set-up the environment for each task; however, she does have a tendency to start unlocking the w/c brakes.   Gait training 56ft x2 (seated break between) using L HHA for safety due to pt reporting she was having significant difficulty walking to/from bathroom with nursing staff immediately prior to therapist arrival - pt demos reciprocal stepping pattern and is performing high knee type walking stating she has been told to pick her foot up because sometimes she trips over her toes causing her to fall forward.  Performed ~126ft of dynamic gait training in therapy gym, no UE support, focusing on balance and visual scanning to find and pick-up cones - while sitting pt able to visually scan and locate all 9 cones, but once pt starts walking she immediately misses the cones to her L requiring total cuing to turn and slowly visually scan in that direction - pt also noted to bump into a bedside table on her L while negotiating around it to pick-up a cone - requires CGA and intermittent min assist for balance  due to primarily L posterior lean.  Transitioned to more challenging cognitive version of the above task by having pt perform dynamic gait in hallway to locate numbered disks (1-10) in ascending order - pt requires max/total cuing throughout to visually scan to the left - pt able to verbalize at the end of the task that is was very challenging to recall to visually scan towards L and to have short-term recall of where the numbers were located - continued to require CGA but more frequent min assist for balance during this task due to more frequent turning and pt becoming more fatigued.   Due to fatigue, transitioned to sitting task focusing on L UE NMR to place magnet letters in order to spell simple 4-letter words - pt requires increased time to coordinate L hand and move letters around on table.  At end of session, pt left seated on toilet in the care of her husband, Shanon Brow, because he is cleared to assist with this per pt's safety plan.    Therapy Documentation Precautions:  Precautions Precautions: Fall Precaution Comments: watch O2 Restrictions Weight Bearing Restrictions: No   Pain: No reports of pain throughout session.   Therapy/Group: Individual Therapy  Tawana Scale , PT, DPT, NCS, CSRS 01/02/2022, 3:28 PM

## 2022-01-03 NOTE — Progress Notes (Signed)
Speech Language Pathology Daily Session Note  Patient Details  Name: Lauren [name] MRN: [id number] Date of Birth: April 08, 1950  Today's Date: 01/03/2022 SLP Individual Time: 4045-9136 SLP Individual Time Calculation (min): 28 min  Short Term Goals: Week 2: SLP Short Term Goal 1 (Week 2): STGs=LTGs due to ELOS  Skilled Therapeutic Interventions: Skilled treatment session focused on cognitive and dysphagia goals. Upon arrival, patient appeared brighter due to anticipation of seeing her dog today. Patient requested to use the bathroom and was continent of urine with Min verbal cues needed for appropriate use of RW in small spaces/obstacles. Patient also performed oral care at the stink with cues needed for thoroughness. Patient consumed trials of thin liquids via cup and demonstrated intermittent overt s/s of aspiration, suspect due to decreased oral control/premature spillage. SLP introduced the 5cc Provale cup in hopes of standardizing amount, however, patient reported that she felt that 5ccs was almost "too much" which continued to result in intermittent coughing. Recommend ongoing trials with SLP. Patient left upright in bed with alarm on and all needs within reach. Continue with current plan of care.      Pain No/Denies Pain   Therapy/Group: Individual Therapy  Bond Grieshop 01/03/2022, 3:19 PM

## 2022-01-03 NOTE — Progress Notes (Addendum)
Physical Therapy Session Note  Patient Details  Name: Lauren [name] MRN: [id number] Date of Birth: Nov 24, 1950  Today's Date: 01/03/2022 PT Individual Time: 3295-1884 PT Individual Time Calculation (min): 68 min   Short Term Goals: Week 1:  PT Short Term Goal 1 (Week 1): Pt will perform bed mobility with CGA PT Short Term Goal 1 - Progress (Week 1): Met PT Short Term Goal 2 (Week 1): Pt will perform bed to chair transfer consistently with minA. PT Short Term Goal 2 - Progress (Week 1): Met PT Short Term Goal 3 (Week 1): Pt will ambulate x150' consistently with minA and LRAD. PT Short Term Goal 3 - Progress (Week 1): Progressing toward goal Week 2:  PT Short Term Goal 1 (Week 2): STGs = LTGs  Skilled Therapeutic Interventions/Progress Updates:    PAIN Session delayed by nursing assessing vitals and administering pain meds.    Pt transported to gym. Standing w/bilat ue support tapping 6in step w/L foot x 10 w/cga Repeated w/single LUE support w/cga Repeated w/no UE support w/min assist due to decreased balance/stability Then performed 10reps w/RLE w/single, UE cga, 10reps w/no UE support, min assist   Repeatee all of the above x 2  Gait >318f no AD initially steppage gait/intentional but pt able to correct when instructed to use normal gait pattern.  Pt overall min assist fading to cga w/quiet environment/open hall.  Obstacles and turning require min assist.   Performed ~3562fof dynamic gait training in therapy gym, no UE support, focusing on balance and visual scanning to find and pick-up cones - while sitting pt able to visually scan and locate all 9 cones.  Progressed task by placing cones in harder to find locations and requiring reaching to various levels for balance challenge.  Pt able to complete at 3 levels of progressive complexity w/cga, a few episodes of light min assist when failing to fully navigate LLE around obstacles.  Functional Gait Assessment performed: Score  12/19, see flowsheet for details.  Stairs: Pt ascended/descended 12 stairs w/2 rails initially w/cga progressing to mod assist w/fatigue due to core instability an dincreasing post lean.  In sitting, pt worked on scanning pile of 25 scrabble letters to find then spell 4 -letter words using dL hand.  Pt initially completes then proceeds to spell additional word and was unable to recall original directions for task. With repeat of instructions pt able to attend and complete multiple times.  Additional time t locate letters to far L   Pt left oob in wc w/alarm belt set and needs in reach.  Husband atbedside.   Therapy Documentation Precautions:  Precautions Precautions: Fall Precaution Comments: watch O2 Restrictions Weight Bearing Restrictions: No    Therapy/Group: Individual Therapy BaCallie FieldingPTAlb[place]0/09/2021, 12:26 PM

## 2022-01-04 DIAGNOSIS — J3489 Other specified disorders of nose and nasal sinuses: Secondary | ICD-10-CM

## 2022-01-04 DIAGNOSIS — Z8659 Personal history of other mental and behavioral disorders: Secondary | ICD-10-CM

## 2022-01-04 MED ORDER — FLUTICASONE PROPIONATE 50 MCG/ACT NA SUSP
1.0000 | Freq: Every day | NASAL | Status: DC
  Administered 2022-01-04 – 2022-01-08 (×5): 1 via NASAL
  Filled 2022-01-04: qty 16

## 2022-01-04 NOTE — Progress Notes (Signed)
PROGRESS NOTE   Subjective/Complaints:  Pt sitting in chair eating a snack.  She does she has mild runny nose, this is a chronic issue for her.   ROS: Denies fevers, HA, hills, N/V, abdominal pain, constipation, diarrhea, SOB, cough, chest pain, new weakness or paraesthesias.    Objective:   No results found. No results for input(s): "WBC", "HGB", "HCT", "PLT" in the last 72 hours.  No results for input(s): "NA", "K", "CL", "CO2", "GLUCOSE", "BUN", "CREATININE", "CALCIUM" in the last 72 hours.   Intake/Output Summary (Last 24 hours) at 01/04/2022 1654 Last data filed at 01/04/2022 1324 Gross per 24 hour  Intake 356 ml  Output --  Net 356 ml         Physical Exam: Vital Signs Blood pressure 111/74, pulse 71, temperature 98.7 F (37.1 C), temperature source Oral, resp. rate 16, height 5' (1.524 m), weight 42.7 kg, SpO2 95 %. Constitutional: No apparent distress. Appropriate appearance for age.  HENT: No JVD. Neck Supple. Trachea midline. Atraumatic, normocephalic. +glasses Eyes: PERRLA. EOMI. Visual fields grossly intact.  Cardiovascular: RRR, no murmurs/rub/gallops. No Edema. Peripheral pulses 2+  Respiratory: CTAB. No rales, rhonchi, or wheezing. On RA. Good air movement.   Abdomen: + bowel sounds, normoactive. No distention or tenderness.  Skin: Mild DTI over sacrum  Neuro: AAO to person, place, time and event. Mild left inattention, L facial droop MSK:       Strength: Moving all 4 limbs antigravity and against resistance.   From Prior exam Neurologic exam:  Cognition: AAO to person, place, time and event. + L inattention, mild Language: Fluent, No substitutions or neoglisms.  Mood: Mildly pressured speech and impulsivity. Improved.  CN: +L facial droop, otherwise intact.  Coordination: FTN intact bilaterally.   MSK:  RUE 5/5 RLE- HF 4+/5; KE 5-/5; DF and PF 5/5 LUE- Biceps 4+/5; Triceps 4+/5; WE 4+/5; Grip  4+/5; and FA 4+/5 LLE- HF 4+/5; KE 5-/5; DF/PF 5-/5        Assessment/Plan: 1. Functional deficits which require 3+ hours per day of interdisciplinary therapy in a comprehensive inpatient rehab setting. Physiatrist is providing close team supervision and 24 hour management of active medical problems listed below. Physiatrist and rehab team continue to assess barriers to discharge/monitor patient progress toward functional and medical goals  Care Tool:  Bathing    Body parts bathed by patient: Right arm, Left arm, Chest, Abdomen, Front perineal area, Buttocks, Right upper leg, Left upper leg, Face   Body parts bathed by helper: Right lower leg, Left lower leg     Bathing assist Assist Level: Minimal Assistance - Patient > 75%     Upper Body Dressing/Undressing Upper body dressing   What is the patient wearing?: Pull over shirt    Upper body assist Assist Level: Minimal Assistance - Patient > 75%    Lower Body Dressing/Undressing Lower body dressing      What is the patient wearing?: Underwear/pull up, Pants     Lower body assist Assist for lower body dressing: Minimal Assistance - Patient > 75%     Toileting Toileting    Toileting assist Assist for toileting: Contact Guard/Touching assist  Transfers Chair/bed transfer  Transfers assist     Chair/bed transfer assist level: Minimal Assistance - Patient > 75%     Locomotion Ambulation   Ambulation assist      Assist level: Moderate Assistance - Patient 50 - 74% Assistive device: No Device Max distance: 150'   Walk 10 feet activity   Assist     Assist level: Moderate Assistance - Patient - 50 - 74% Assistive device: No Device   Walk 50 feet activity   Assist    Assist level: Moderate Assistance - Patient - 50 - 74% Assistive device: No Device    Walk 150 feet activity   Assist    Assist level: Moderate Assistance - Patient - 50 - 74% Assistive device: No Device    Walk 10  feet on uneven surface  activity   Assist     Assist level: Moderate Assistance - Patient - 50 - 74% Assistive device:  (R handrail)   Wheelchair     Assist Is the patient using a wheelchair?: Yes Type of Wheelchair: Manual    Wheelchair assist level: Total Assistance - Patient < 25% Max wheelchair distance: 150'    Wheelchair 50 feet with 2 turns activity    Assist        Assist Level: Total Assistance - Patient < 25%   Wheelchair 150 feet activity     Assist      Assist Level: Total Assistance - Patient < 25%   Blood pressure 111/74, pulse 71, temperature 98.7 F (37.1 C), temperature source Oral, resp. rate 16, height 5' (1.524 m), weight 42.7 kg, SpO2 95 %.  Medical Problem List and Plan: 1. Functional deficits secondary to R MCA stroke with L sensory deficits and L hemiparesis as well as dysphagia and L inattention             -patient may  shower             -ELOS/Goals: 10-14 days- supervision maybe min A due to sensory deficits  -Continue CIR with PT/OT/SLP  2.  Antithrombotics: -DVT/anticoagulation:  Pharmaceutical: Lovenox             -antiplatelet therapy: ASA. Per DC, "She has been started on only aspirin 81 mg p.o. daily instead of DAPT due to previous history of large ICH/hemorrhagic stroke." Can address with Neuro at OP f/u.   3. DDD Cervical spine/Pain Management: tylenol prn.   4. Mood/Behavior/Sleep:  LCSW to follow for evaluation and support.              - Hx  ADHD: On Adderall daily.              - Hx Bipolar disorder: Stable on home regimen of Lamictal, Seroquel and buspar for anxiety  - ?mania component w/ impulsivity; per family somewhat baseline but behaviors have worsened s/p strokes              -antipsychotic agents:  Continue              - neuropsych consult next week if appropriate - performed 10/4, no new recs            - 9/30: Fall OOB, noted impulsive behaviors and poor safety awareness, however no further tele  available in hospital; nursing to place belt alarm in bed             - 10/4 DC scheduled atarax d/t dry mouth, not home med. PRN available and not used.   -  Seen by neuropsychology 10/6 5. Neuropsych/cognition: This patient may be intermittently capable of making decisions on her own behalf. 6. Skin/Wound Care: Routine pressure relief measures.           - Sacral DTI - Sacral heart, change PRN  7. Fluids/Electrolytes/Nutrition: Monitor I/O. Check CMET in am.    Latest Ref Rng & Units 12/29/2021    6:55 AM 12/26/2021    6:31 AM 12/25/2021    5:51 AM  BMP  Glucose 70 - 99 mg/dL 87  86  103   BUN 8 - 23 mg/dL 13  13  11    Creatinine 0.44 - 1.00 mg/dL 0.62  0.61  0.43   Sodium 135 - 145 mmol/L 140  140  140   Potassium 3.5 - 5.1 mmol/L 3.8  4.2  3.9   Chloride 98 - 111 mmol/L 105  105  105   CO2 22 - 32 mmol/L 28  30  26    Calcium 8.9 - 10.3 mg/dL 8.8  8.9  8.9    Recheck tomorrow  8. HTN: Monitor BP TID, well controlled    01/04/2022    3:18 PM 01/04/2022    3:42 AM 01/03/2022    7:54 PM  Vitals with BMI  Systolic 572 91 620  Diastolic 74 61 70  Pulse 71 79 84  - 10/4 IVF overnight for asymptomatic hypotension 70/40; improved this AM, DC maintenance fluids.   - 10/8 well controlled, continue to monitor  9. CAD: Monitor for symptoms with increase in activity. Continue ASA, Lipitor. Denies chest pain 10. 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  --Stable on Breztri PTA and husband provided from home.  11. Chronic tremors: Managed with inderal and Lamictal--worse in am and gets better as day progresses.  12.  Perforated gastric ulcer s/p repair 07/2021: On PPI 13. OSA: Nocturnal oxygen in the  past. Will order prn.          - 10/3 pt refusing CPAP as no longer needs OP s/p weight loss. Will DC 14. Dysphagia/Cough: on D2 nectar thick liquids. Educated patient and husband on aspiration risk and need for nectar liquids.               --flutter valve added.             --check CXR - 9/29 no acute cardiopulmonary process  -Upgraded to DYS 3 diet 9/29  15. Blurred vision - added PRN eye drops. - improved              - Encouraged work with therapies - OT notified              - Can consider OP neuroptho eval if persistent.    16. Urinary urgency              - Start ditropan 2.5 mg TID 10/4 - much improved              - PVR Qshift for 2 days to ensure no retention with new medication - 1x scan >200; continue to monitor 17. Rhinorrhea -Will start folnase   LOS: 10 days A FACE TO FACE EVALUATION WAS PERFORMED  Jennye Boroughs 01/04/2022, 4:54 PM

## 2022-01-05 LAB — BASIC METABOLIC PANEL
Anion gap: 9 (ref 5–15)
BUN: 17 mg/dL (ref 8–23)
CO2: 26 mmol/L (ref 22–32)
Calcium: 9.4 mg/dL (ref 8.9–10.3)
Chloride: 108 mmol/L (ref 98–111)
Creatinine, Ser: 0.52 mg/dL (ref 0.44–1.00)
GFR, Estimated: >60 mL/min (ref >60)
Glucose, Bld: 87 mg/dL (ref 70–99)
Potassium: 4.3 mmol/L (ref 3.5–5.1)
Sodium: 143 mmol/L (ref 135–145)

## 2022-01-05 LAB — CBC
HCT: 40.7 % (ref 36.0–46.0)
Hemoglobin: 13.6 g/dL (ref 12.0–15.0)
MCH: 29.4 pg (ref 26.0–34.0)
MCHC: 33.4 g/dL (ref 30.0–36.0)
MCV: 87.9 fL (ref 80.0–100.0)
Platelets: 460 10*3/uL — ABNORMAL HIGH (ref 150–400)
RBC: 4.63 MIL/uL (ref 3.87–5.11)
RDW: 13.6 % (ref 11.5–15.5)
WBC: 6.9 10*3/uL (ref 4.0–10.5)
nRBC: 0 % (ref 0.0–0.2)

## 2022-01-05 NOTE — Progress Notes (Signed)
Patient ID: Lauren [name], female   DOB: May 12, 1950, 71 y.o.   MRN: [id number] Met with the patient and spouse to review rehab process, current situation, team conference and plan of care. Discussed secondary risk including HTN, DM, HLD and smoking cessation. Reviewed medications and dietary modification recommendations. Continue to follow along to address educational needs to facilitate preparation for discharge home with spouse. Margarito Liner

## 2022-01-05 NOTE — Discharge Instructions (Addendum)
Inpatient Rehab Discharge Instructions  Lauren [name] Discharge date and time:  01/09/22  Activities/Precautions/ Functional Status: Activity: no lifting, driving, or strenuous exercise till cleared by MD Diet: cardiac diet- Soft foods. Liquids by provale cup to prevent aspiration/pneumonia Wound Care: none needed   Functional status:  ___ No restrictions     ___ Walk up steps independently __X_ 24/7 supervision/assistance   ___ Walk up steps with assistance ___ Intermittent supervision/assistance  ___ Bathe/dress independently ___ Walk with walker     __X_ Bathe/dress with assistance ___ Walk Independently    ___ Shower independently ___ Walk with assistance    ___ Shower with assistance _X__ No alcohol     ___ Return to work/school ________   COMMUNITY REFERRALS UPON DISCHARGE:    Outpatient: PT     OT    ST                [name]  HUDJS:[phone]              Appointment Date/Time:*Please expect follow-up within 7-10 business days to schedule your appointment. If you have not received follow-up, be sure to contact the site directly.*    Special Instructions:  STROKE/TIA DISCHARGE INSTRUCTIONS SMOKING Cigarette smoking nearly doubles your risk of having a stroke & is the single most alterable risk factor  If you smoke or have smoked in the last 12 months, you are advised to quit smoking for your health. Most of the excess cardiovascular risk related to smoking disappears within a year of stopping. Ask you doctor about anti-smoking medications [place] Quit Line: 1-800-QUIT NOW Free Smoking Cessation Classes [phone]  CHOLESTEROL Know your levels; limit fat & cholesterol in your diet  Lipid Panel     Component Value Date/Time   CHOL 151 12/21/2021 0324   TRIG 88 12/21/2021 0324   HDL 30 (L) 12/21/2021 0324   CHOLHDL 5.0 12/21/2021 0324   VLDL 18 12/21/2021 0324   LDLCALC 103 (H) 12/21/2021 0324     Many patients benefit from  treatment even if their cholesterol is at goal. Goal: Total Cholesterol (CHOL) less than 160 Goal:  Triglycerides (TRIG) less than 150 Goal:  HDL greater than 40 Goal:  LDL (LDLCALC) less than 100   BLOOD PRESSURE American Stroke Association blood pressure target is less that 120/80 mm/Hg  Your discharge blood pressure is:  BP: 100/68 Monitor your blood pressure Limit your salt and alcohol intake Many individuals will require more than one medication for high blood pressure  DIABETES (A1c is a blood sugar average for last 3 months) Goal HGBA1c is under 7% (HBGA1c is blood sugar average for last 3 months)  Diabetes: No known diagnosis of diabetes    Lab Results  Component Value Date   HGBA1C 5.8 (H) 12/20/2021    Your HGBA1c can be lowered with medications, healthy diet, and exercise. Check your blood sugar as directed by your physician Call your physician if you experience unexplained or low blood sugars.  PHYSICAL ACTIVITY/REHABILITATION Goal is 30 minutes at least 4 days per week  Activity: No driving, Therapies: see above Return to work: N/a Activity decreases your risk of heart attack and stroke and makes your heart stronger.  It helps control your weight and blood pressure; helps you relax and can improve your mood. Participate in a regular exercise program. Talk with your doctor about the best form of exercise for you (dancing, walking, swimming, cycling).  DIET/WEIGHT Goal is to maintain a  healthy weight  Your discharge diet is:  Diet Order             DIET DYS 3 Room service appropriate? Yes; Fluid consistency: Thin  Diet effective now                   liquids Your height is:  Height: 5' (152.4 cm) Your current weight is: Weight: 42.7 kg Your Body Mass Index (BMI) is:  BMI (Calculated): 18.38 Following the type of diet specifically designed for you will help prevent another stroke. You are below goal weight  Your goal Body Mass Index (BMI) is 19-24. Healthy food  habits can help reduce 3 risk factors for stroke:  High cholesterol, hypertension, and excess weight.  RESOURCES Stroke/Support Group:  Call [phone]   STROKE EDUCATION PROVIDED/REVIEWED AND GIVEN TO PATIENT Stroke warning signs and symptoms How to activate emergency medical system (call 911). Medications prescribed at discharge. Need for follow-up after discharge. Personal risk factors for stroke. Pneumonia vaccine given:  Flu vaccine given:  My questions have been answered, the writing is legible, and I understand these instructions.  I will adhere to these goals & educational materials that have been provided to me after my discharge from the hospital.      My questions have been answered and I understand these instructions. I will adhere to these goals and the provided educational materials after my discharge from the hospital.  Patient/Caregiver Signature _______________________________ Date __________  Clinician Signature _______________________________________ Date __________  Please bring this form and your medication list with you to all your follow-up doctor's appointments.

## 2022-01-05 NOTE — Progress Notes (Signed)
Occupational Therapy Session Note  Patient Details  Name: Avamae Dehaan MRN: [id number] Date of Birth: Sep 22, 1950  Today's Date: 01/05/2022 OT Individual Time: 1120-1203 OT Individual Time Calculation (min): 43 min    Short Term Goals: Week 2:  OT Short Term Goal 1 (Week 2): LTG=STG 2/2 ELOS  Skilled Therapeutic Interventions/Progress Updates:    Patient received seated in wheelchair, visiting with husband.  Patient requesting to take a shower.  Ambulated to bathroom.  Patient with poor proprioceptive awareness throughout left side.  Patient moves quickly, and has poor postural awareness.  Did well with cueing to slow down and remain over base of support.  Patient sat to shower - used left hand functionally although poor orientation of hand to bathing items.  Patient able to bathe and dress herself with intermittent assist to manage water, and cueing for alignment/ balance.   Worked on functional mobility  - working to de-emphasize UE's for walking, and establishing a cadence to steps with audible cue.  Patient did well with facilitation to reduce excessive extension in trunk/hips, to maintain mass over base of support.  Worked on sit to stand transition from varying heights - with emphasis on forward flexion sufficient to weight feet and prevent backward loss of balance.  Patient returned to room and left up in wheelchair with husband present.    Therapy Documentation Precautions:  Precautions Precautions: Fall Precaution Comments: watch O2 Restrictions Weight Bearing Restrictions: No   Pain:  Denies pain   Therapy/Group: Individual Therapy  Mariah Milling 01/05/2022, 12:38 PM

## 2022-01-05 NOTE — Progress Notes (Signed)
PROGRESS NOTE   Subjective/Complaints: Pt seen in AM with husband at bedside. Doing well, rhinorrhea improved with flonase. No acute complaints.   PT reports this PM patient expresses interest in tobacco cessation.   ROS: Denies fevers, HA, hills, N/V, abdominal pain, constipation, diarrhea, SOB, cough, chest pain, new weakness or paraesthesias.    Objective:   No results found. Recent Labs    01/05/22 0639  WBC 6.9  HGB 13.6  HCT 40.7  PLT 460*    Recent Labs    01/05/22 0639  NA 143  K 4.3  CL 108  CO2 26  GLUCOSE 87  BUN 17  CREATININE 0.52  CALCIUM 9.4     Intake/Output Summary (Last 24 hours) at 01/05/2022 2143 Last data filed at 01/05/2022 1900 Gross per 24 hour  Intake 600 ml  Output --  Net 600 ml         Physical Exam: Vital Signs Blood pressure 110/64, pulse 69, temperature 98.3 F (36.8 C), temperature source Oral, resp. rate 18, height 5' (1.524 m), weight 42.7 kg, SpO2 94 %. Constitutional: No apparent distress. Appropriate appearance for age.  HENT: No JVD. Neck Supple. Trachea midline. Atraumatic, normocephalic. +glasses Eyes: PERRLA. EOMI. Visual fields grossly intact.  Cardiovascular: RRR, no murmurs/rub/gallops. No Edema. Peripheral pulses 2+  Respiratory: CTAB. No rales, rhonchi, or wheezing. On RA. Good air movement.   Abdomen: + bowel sounds, normoactive. No distention or tenderness.  Skin: Mild DTI over sacrum not examined today.  Neuro: AAO to person, place, time and event. Mild left inattention - not noticeable today, L facial droop - mild MSK: antigravity and against resistance in all 4 extremities    Assessment/Plan: 1. Functional deficits which require 3+ hours per day of interdisciplinary therapy in a comprehensive inpatient rehab setting. Physiatrist is providing close team supervision and 24 hour management of active medical problems listed below. Physiatrist and  rehab team continue to assess barriers to discharge/monitor patient progress toward functional and medical goals  Care Tool:  Bathing    Body parts bathed by patient: Right arm, Left arm, Chest, Abdomen, Front perineal area, Buttocks, Right upper leg, Left upper leg, Face   Body parts bathed by helper: Right lower leg, Left lower leg     Bathing assist Assist Level: Minimal Assistance - Patient > 75%     Upper Body Dressing/Undressing Upper body dressing   What is the patient wearing?: Pull over shirt    Upper body assist Assist Level: Minimal Assistance - Patient > 75%    Lower Body Dressing/Undressing Lower body dressing      What is the patient wearing?: Underwear/pull up, Pants     Lower body assist Assist for lower body dressing: Minimal Assistance - Patient > 75%     Toileting Toileting    Toileting assist Assist for toileting: Contact Guard/Touching assist     Transfers Chair/bed transfer  Transfers assist     Chair/bed transfer assist level: Minimal Assistance - Patient > 75%     Locomotion Ambulation   Ambulation assist      Assist level: Moderate Assistance - Patient 50 - 74% Assistive device: No Device Max distance: 150'  Walk 10 feet activity   Assist     Assist level: Moderate Assistance - Patient - 50 - 74% Assistive device: No Device   Walk 50 feet activity   Assist    Assist level: Moderate Assistance - Patient - 50 - 74% Assistive device: No Device    Walk 150 feet activity   Assist    Assist level: Moderate Assistance - Patient - 50 - 74% Assistive device: No Device    Walk 10 feet on uneven surface  activity   Assist     Assist level: Moderate Assistance - Patient - 50 - 74% Assistive device:  (R handrail)   Wheelchair     Assist Is the patient using a wheelchair?: Yes Type of Wheelchair: Manual    Wheelchair assist level: Total Assistance - Patient < 25% Max wheelchair distance: 150'     Wheelchair 50 feet with 2 turns activity    Assist        Assist Level: Total Assistance - Patient < 25%   Wheelchair 150 feet activity     Assist      Assist Level: Total Assistance - Patient < 25%   Blood pressure 110/64, pulse 69, temperature 98.3 F (36.8 C), temperature source Oral, resp. rate 18, height 5' (1.524 m), weight 42.7 kg, SpO2 94 %.  Medical Problem List and Plan: 1. Functional deficits secondary to R MCA stroke with L sensory deficits and L hemiparesis as well as dysphagia and L inattention             -patient may  shower             -ELOS/Goals: 10-14 days- supervision maybe min A due to sensory deficits  -Continue CIR with PT/OT/SLP  2.  Antithrombotics: -DVT/anticoagulation:  Pharmaceutical: Lovenox             -antiplatelet therapy: ASA. Per DC, "She has been started on only aspirin 81 mg p.o. daily instead of DAPT due to previous history of large ICH/hemorrhagic stroke." Can address with Neuro at OP f/u.   3. DDD Cervical spine/Pain Management: tylenol prn.   4. Mood/Behavior/Sleep:  LCSW to follow for evaluation and support.              - Hx  ADHD: On Adderall daily.              - Hx Bipolar disorder: Stable on home regimen of Lamictal, Seroquel and buspar for anxiety  - ?mania component w/ impulsivity; per family somewhat baseline but behaviors have worsened s/p strokes              -antipsychotic agents:  Continue              - neuropsych consult next week if appropriate - performed 10/4, no new recs            - 9/30: Fall OOB, noted impulsive behaviors and poor safety awareness, however no further tele available in hospital; nursing to place belt alarm in bed             - 10/4 DC scheduled atarax d/t dry mouth, not home med. PRN available and not used.    5. Neuropsych/cognition: This patient is capable of making decisions on her own behalf. 6. Skin/Wound Care: Routine pressure relief measures.           - Sacral DTI - Sacral  heart, change PRN  7. Fluids/Electrolytes/Nutrition: Monitor I/O. Check CMET in am.  Latest Ref Rng & Units 01/05/2022    6:39 AM 12/29/2021    6:55 AM 12/26/2021    6:31 AM  BMP  Glucose 70 - 99 mg/dL 87  87  86   BUN 8 - 23 mg/dL 17  13  13    Creatinine 0.44 - 1.00 mg/dL 0.52  0.62  0.61   Sodium 135 - 145 mmol/L 143  140  140   Potassium 3.5 - 5.1 mmol/L 4.3  3.8  4.2   Chloride 98 - 111 mmol/L 108  105  105   CO2 22 - 32 mmol/L 26  28  30    Calcium 8.9 - 10.3 mg/dL 9.4  8.8  8.9   10/9 - labs WNL, no changes  8. HTN: Monitor BP TID, well controlled    01/05/2022    7:19 PM 01/05/2022    3:10 PM 01/05/2022    4:52 AM  Vitals with BMI  Systolic 063 016 98  Diastolic 64 76 60  Pulse 69 62 77  - 10/4 IVF overnight for asymptomatic hypotension 70/40; improved this AM, DC maintenance fluids.   - 10/8 well controlled, continue to monitor  9. CAD: Monitor for symptoms with increase in activity. Continue ASA, Lipitor. Denies chest pain 10. 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  --Stable on Breztri PTA and husband provided from home.  - Will look into tobacco cessation education on discharge  11. Chronic tremors: Managed with inderal and Lamictal--worse in am and gets better as day progresses.  12.  Perforated gastric ulcer s/p repair 07/2021: On PPI 13. OSA: Nocturnal oxygen in the  past. Will order prn.          - 10/3 pt refusing CPAP as no longer needs OP s/p weight loss. Will DC 14. Dysphagia/Cough: on D2 nectar thick liquids. Educated patient and husband on aspiration risk and need for nectar liquids.              --flutter valve added.             --check CXR - 9/29 no acute cardiopulmonary process  -Upgraded to DYS 3 diet 9/29  15. Blurred vision - added PRN eye drops. - improved              - Encouraged work with therapies - OT notified              - Can consider OP neuroptho eval if persistent.    16. Urinary  urgency              - Start ditropan 2.5 mg TID 10/4 - much improved              - PVR Qshift for 2 days to ensure no retention with new medication - 1x scan >200; continue to monitor               - 10/9 no further high scans; continue current regimen   17. Rhinorrhea - improved -Will start flonase 10/8  LOS: 11 days A FACE TO [place] 01/05/2022, 9:43 PM

## 2022-01-05 NOTE — Progress Notes (Signed)
Speech Language Pathology Daily Session Note  Patient Details  Name: Lauren [name] MRN: [id number] Date of Birth: [birth year]  Today's Date: 01/05/2022 SLP Individual Time: 1405-1505 SLP Individual Time Calculation (min): 60 min  Short Term Goals: Week 2: SLP Short Term Goal 1 (Week 2): STGs=LTGs due to ELOS  Skilled Therapeutic Interventions: Skilled treatment session focused on cognitive and dysphagia goals. Upon arrival, patient was consuming thin liquids from the Provale cup this SLP had left in her room to use in treatment sessions. Patient reported that she doesn't "specifically" remember SLP telling patient she could consume thin liquids but she wanted to continue to practice with oral control. Patient reports intermittent overt s/s of aspiration but none were observed throughout treatment session. Recommend repeat MBS tomorrow to assess swallow function. SLP also facilitated session by providing overall Min A question cues for problem solving during a complex scheduling task. Patient propelled her wheelchair back to her room with Mod verbal cues needed for problem solving with task to avoid obstacles. Patient transferred back to bed at end of session and left with alarm on and all needs within reach. Continue with current plan of care.      Pain No/Denies Pain   Therapy/Group: Individual Therapy  Lauren [name] 01/05/2022, 3:10 PM

## 2022-01-05 NOTE — Progress Notes (Signed)
Physical Therapy Session Note  Patient Details  Name: Lauren [name] MRN: [id number] Date of Birth: [birth year]  Today's Date: 01/05/2022 PT Individual Time: 0902-0959 PT Individual Time Calculation (min): 57 min   Short Term Goals: Week 2:  PT Short Term Goal 1 (Week 2): STGs = LTGs  Skilled Therapeutic Interventions/Progress Updates:     Pt received seated in [facility] and agrees to therapy. No complaint of pain but reports having a "rough" weekend and not wanting to backslide after having a day off from therapy. Pt dons shoes with setup assistance and cues to ensure completion. WC transport to gym. PT performs sit to stand with CGA and cues for initiation. Pt ambulates x400' with CGA primarily but x1-2 instances of minA due to LOBs to the L when pt becomes distracted.   PT has extended discussion with pt regarding smoking cessation and risks of continuing to smoke for medical and therapeutic purposes.   Pt performs activity in which she is tasked with standing from mat table, ambulating ~10' around several obstacles, then retrieving "squigz" suctioned to window at or above eye level. Pt then tasked with taking squig back to container on mat table. Performed to challenge pt's attention to task, L sided inattention, balance, and coordination. Pt becomes easily distracted throughout activity, frequently stopping to look out window, and on several different instances tripping over EVA walker, despite PT cues to attend to L visual field. Pt also requires several seated rest breaks during activity.   WC transport back to room. Left seated in WC with all needs within reach.  Therapy Documentation Precautions:  Precautions Precautions: Fall Precaution Comments: watch O2 Restrictions Weight Bearing Restrictions: No   Therapy/Group: Individual Therapy  Breck Coons, PT, DPT 01/05/2022, 5:25 PM

## 2022-01-05 NOTE — Progress Notes (Signed)
Physical Therapy Session Note  Patient Details  Name: Lauren [name] MRN: [id number] Date of Birth: Feb 23, 1951  Today's Date: 01/05/2022 PT Individual Time: 1300-1345 PT Individual Time Calculation (min): 45 min   Short Term Goals: Week 2:  PT Short Term Goal 1 (Week 2): STGs = LTGs  Skilled Therapeutic Interventions/Progress Updates:     Patient in w/c roaming around the room upon PT arrival. Patient alert and agreeable to PT session. Patient denied pain during session.  Spent increased time discussing modifiable risk factors for stroke and smoking cessation. Patient reports smoking 2-3 packs per day ~60 years, states "I have 3-5 in the first 30 min of my day." Educated on effects on cardiovascular system and healing from smoking. Discussed previous strategies for quitting smoking. Patient reports trying nicotine patches or gum, replacing smoking with another activity, and hypnosis, all without effective benefit. Patient reports constant cravings that she is "tolerating" while in the hospital, however, reports frequent urge to "go outside and smoke." Patient understands the benefits of smoking cessation for her health, but is distressed over the challenge of quitting. Also, reports increased personal and family distress around patient's smoking and health consequences. Educated on pursuing  professional assistance with smoking cessation at d/c due to significance of hx of use. Will pass on to MD and CSW.   Focused remainder of session on dynamic gait training and dual task activities.   Therapeutic Activity: Transfers: Patient performed sit to/from stand x7 with CGA-close supervision with cues for safety due to intermittent impulsivity.   Gait Training:  Patient ambulated >100 feet x2 without an AD with CGA and min A x2 due to L LOB. Ambulated with decreased gait speed, decreased step length and height, narrow BOS with intermittent scissoring of L foot, mild forward trunk lean, and reduced  visual scanning L>R. Provided verbal cues for increased BOS, slower speed on turns and increased speed for improved balance when ambulating straight ahead, and increased visul scanning with L external targets to identify throughout.  Neuromuscular Re-ed: Weaving through 8 cones down and back placed 1.5 feet apart with CGA-min A with intermittent L LOB with scissoring on turns Trial 1: 50 sec, hit 1 cone Trial 2: 45 sec, missed 5 cones due to distraction and reduced recall of instructions Trial 3: 48 sec, hit and missed 0 cones Trial 4: while carrying 3 folded towels, 1 min 6 sec, hit and missed 0 cones Trial 5: while picking up 3 towels from the floor at cones 2, 4, and 6, folding towels at table after cone 8 then carrying towels back through 8 cones on return, 4 min 12 sec, hit 1 cone and missed 1 cone; perseverated on folding technique requiring increased time for task  Patient in w/c in the room at end of session with breaks locked, seat belt alarm set, and all needs within reach.   Therapy Documentation Precautions:  Precautions Precautions: Fall Precaution Comments: watch O2 Restrictions Weight Bearing Restrictions: No    Therapy/Group: Individual Therapy  Lauren [name] L Lauren [name] PT, DPT, NCS, CBIS  01/05/2022, 4:08 PM

## 2022-01-06 ENCOUNTER — Inpatient Hospital Stay (HOSPITAL_COMMUNITY): Payer: Medicare Other

## 2022-01-06 NOTE — Plan of Care (Signed)
  Problem: RH Balance Goal: LTG Patient will maintain dynamic standing with ADLs (OT) Description: LTG:  Patient will maintain dynamic standing balance with assist during activities of daily living (OT)  Flowsheets (Taken 01/06/2022 1053) LTG: Pt will maintain dynamic standing balance during ADLs with: Contact Guard/Touching assist Note: Goal downgraded 10/10 due to poor attention causing LOB-ESD   Problem: RH Dressing Goal: LTG Patient will perform lower body dressing w/assist (OT) Description: LTG: Patient will perform lower body dressing with assist, with/without cues in positioning using equipment (OT) Flowsheets (Taken 01/06/2022 1053) LTG: Pt will perform lower body dressing with assistance level of: Contact Guard/Touching assist Note: Goal downgraded 10/10 due to poor attention causing LOB-ESD   Problem: RH Toilet Transfers Goal: LTG Patient will perform toilet transfers w/assist (OT) Description: LTG: Patient will perform toilet transfers with assist, with/without cues using equipment (OT) Flowsheets (Taken 01/06/2022 1053) LTG: Pt will perform toilet transfers with assistance level of: Contact Guard/Touching assist Note: Goal downgraded 10/10 due to poor attention causing LOB-ESD   Problem: RH Tub/Shower Transfers Goal: LTG Patient will perform tub/shower transfers w/assist (OT) Description: LTG: Patient will perform tub/shower transfers with assist, with/without cues using equipment (OT) Note: Goal downgraded 10/10 due to poor attention causing LOB-ESD   Problem: RH Attention Goal: LTG Patient will demonstrate this level of attention during functional activites (OT) Description: LTG:  Patient will demonstrate this level of attention during functional activites  (OT) Flowsheets (Taken 01/06/2022 1053) LTG: Patient will demonstrate this level of attention during functional activites (OT): Minimal Assistance - Patient > 75% Note: Goal downgraded 10/10 due to poor attention  causing LOB-ESD   Problem: RH Awareness Goal: LTG: Patient will demonstrate awareness during functional activites type of (OT) Description: LTG: Patient will demonstrate awareness during functional activites type of (OT) Flowsheets (Taken 01/06/2022 1053) LTG: Patient will demonstrate awareness during functional activites type of (OT): Minimal Assistance - Patient > 75% Note: Goal downgraded 10/10 due to poor attention causing LOB-ESD

## 2022-01-06 NOTE — Progress Notes (Signed)
Engineer, civil (consulting) related to smoking cessation. Also provided additional materials via video.  Informed patient of information as she was returning from therapy and transport picking her up.  Information left on bedside table with education binder.

## 2022-01-06 NOTE — Progress Notes (Signed)
PROGRESS NOTE   Subjective/Complaints:  Patient seen and evaluated at bedside. Doing well, no complaints. Had swallow eval this AM and was cleared for thin liquids in a provalve cup, which she is happy about.  Discussed her interest in smoking cessation, she states she has tried to quit both on her own and with medication assistance in the past, without much success. She states nicotine gum was the most helpful but she could no longer use it once she had dentures; patches and lozenges did not work as well. She states having 2 strokes in the past year has motivated her to quit. Advised her to use medical assistance and try a gradual approach with monitoring and goal setting, as these tend to have longer term success.   ROS: Denies fevers, HA, hills, N/V, abdominal pain, constipation, diarrhea, SOB, cough, chest pain, new weakness or paraesthesias.    Objective:   No results found. Recent Labs    01/05/22 0639  WBC 6.9  HGB 13.6  HCT 40.7  PLT 460*    Recent Labs    01/05/22 0639  NA 143  K 4.3  CL 108  CO2 26  GLUCOSE 87  BUN 17  CREATININE 0.52  CALCIUM 9.4     Intake/Output Summary (Last 24 hours) at 01/06/2022 1048 Last data filed at 01/06/2022 0814 Gross per 24 hour  Intake 596 ml  Output --  Net 596 ml         Physical Exam: Vital Signs Blood pressure 110/64, pulse 69, temperature 98.3 F (36.8 C), temperature source Oral, resp. rate 18, height 5' (1.524 m), weight 42.7 kg, SpO2 94 %. Constitutional: No apparent distress. Appropriate appearance for age.  HENT: No JVD. Neck Supple. Trachea midline. Atraumatic, normocephalic. +glasses Eyes: PERRLA. EOMI. Visual fields grossly intact.  Cardiovascular: RRR, no murmurs/rub/gallops. No Edema. Peripheral pulses 2+  Respiratory: CTAB. No rales, rhonchi, or wheezing. On RA. Good air movement.   Abdomen: + bowel sounds, normoactive. No distention or  tenderness.  Skin: Mild DTI over sacrum not examined today.  Neuro: AAO to person, place, time and event. Mild left inattention - not noticeable today, L facial droop - mild MSK: antigravity and against resistance in all 4 extremities    Assessment/Plan: 1. Functional deficits which require 3+ hours per day of interdisciplinary therapy in a comprehensive inpatient rehab setting. Physiatrist is providing close team supervision and 24 hour management of active medical problems listed below. Physiatrist and rehab team continue to assess barriers to discharge/monitor patient progress toward functional and medical goals  Care Tool:  Bathing    Body parts bathed by patient: Right arm, Left arm, Chest, Abdomen, Front perineal area, Buttocks, Right upper leg, Left upper leg, Face   Body parts bathed by helper: Right lower leg, Left lower leg     Bathing assist Assist Level: Minimal Assistance - Patient > 75%     Upper Body Dressing/Undressing Upper body dressing   What is the patient wearing?: Pull over shirt    Upper body assist Assist Level: Minimal Assistance - Patient > 75%    Lower Body Dressing/Undressing Lower body dressing      What is the  patient wearing?: Underwear/pull up, Pants     Lower body assist Assist for lower body dressing: Minimal Assistance - Patient > 75%     Toileting Toileting    Toileting assist Assist for toileting: Contact Guard/Touching assist     Transfers Chair/bed transfer  Transfers assist     Chair/bed transfer assist level: Minimal Assistance - Patient > 75%     Locomotion Ambulation   Ambulation assist      Assist level: Moderate Assistance - Patient 50 - 74% Assistive device: No Device Max distance: 150'   Walk 10 feet activity   Assist     Assist level: Moderate Assistance - Patient - 50 - 74% Assistive device: No Device   Walk 50 feet activity   Assist    Assist level: Moderate Assistance - Patient - 50 -  74% Assistive device: No Device    Walk 150 feet activity   Assist    Assist level: Moderate Assistance - Patient - 50 - 74% Assistive device: No Device    Walk 10 feet on uneven surface  activity   Assist     Assist level: Moderate Assistance - Patient - 50 - 74% Assistive device:  (R handrail)   Wheelchair     Assist Is the patient using a wheelchair?: Yes Type of Wheelchair: Manual    Wheelchair assist level: Total Assistance - Patient < 25% Max wheelchair distance: 150'    Wheelchair 50 feet with 2 turns activity    Assist        Assist Level: Total Assistance - Patient < 25%   Wheelchair 150 feet activity     Assist      Assist Level: Total Assistance - Patient < 25%   Blood pressure 110/64, pulse 69, temperature 98.3 F (36.8 C), temperature source Oral, resp. rate 18, height 5' (1.524 m), weight 42.7 kg, SpO2 94 %.  Medical Problem List and Plan: 1. Functional deficits secondary to R MCA stroke with L sensory deficits and L hemiparesis as well as dysphagia and L inattention             -patient may  shower             -ELOS/Goals: 10-14 days- supervision with PT, OT, SLP  -Continue CIR with PT/OT/SLP  2.  Antithrombotics: -DVT/anticoagulation:  Pharmaceutical: Lovenox             -antiplatelet therapy: ASA. Per DC, "She has been started on only aspirin 81 mg p.o. daily instead of DAPT due to previous history of large ICH/hemorrhagic stroke." Can address with Neuro at OP f/u.   3. DDD Cervical spine/Pain Management: tylenol prn.   4. Mood/Behavior/Sleep:  LCSW to follow for evaluation and support.              - Hx  ADHD: On Adderall daily.              - Hx Bipolar disorder: Stable on home regimen of Lamictal, Seroquel and buspar for anxiety  - ?mania component w/ impulsivity; per family somewhat baseline but behaviors have worsened s/p strokes              -antipsychotic agents:  Continue              - neuropsych consult next week  if appropriate - performed 10/4, no new recs            - 9/30: Fall OOB, noted impulsive behaviors and poor safety awareness, however  no further tele available in hospital; nursing to place belt alarm in bed             - 10/4 DC scheduled atarax d/t dry mouth, not home med. PRN available and not used.    5. Neuropsych/cognition: This patient is capable of making decisions on her own behalf. 6. Skin/Wound Care: Routine pressure relief measures.           - Sacral DTI - Sacral heart, change PRN  7. Fluids/Electrolytes/Nutrition: Monitor I/O. Check CMET in am.    Latest Ref Rng & Units 01/05/2022    6:39 AM 12/29/2021    6:55 AM 12/26/2021    6:31 AM  BMP  Glucose 70 - 99 mg/dL 87  87  86   BUN 8 - 23 mg/dL 17  13  13    Creatinine 0.44 - 1.00 mg/dL 0.52  0.62  0.61   Sodium 135 - 145 mmol/L 143  140  140   Potassium 3.5 - 5.1 mmol/L 4.3  3.8  4.2   Chloride 98 - 111 mmol/L 108  105  105   CO2 22 - 32 mmol/L 26  28  30    Calcium 8.9 - 10.3 mg/dL 9.4  8.8  8.9   10/9 - labs WNL, no changes  8. HTN: Monitor BP TID, well controlled    01/05/2022    7:19 PM 01/05/2022    3:10 PM 01/05/2022    4:52 AM  Vitals with BMI  Systolic 952 841 98  Diastolic 64 76 60  Pulse 69 62 77  - 10/4 IVF overnight for asymptomatic hypotension 70/40; improved this AM, DC maintenance fluids.   - 10/8 well controlled, continue to monitor  9. CAD: Monitor for symptoms with increase in activity. Continue ASA, Lipitor. Denies chest pain 10. 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  --Stable on Breztri PTA and husband provided from home.  - Provided packet on tobacco cessation education by nursing 10/10; pt will pursue medical assistance for quitting at discharge  11. Chronic tremors: Managed with inderal and Lamictal--worse in am and gets better as day progresses.  12.  Perforated gastric ulcer s/p repair 07/2021: On PPI 13. OSA: Nocturnal oxygen in the   past. Will order prn.          - 10/3 pt refusing CPAP as no longer needs OP s/p weight loss. DC  14. Dysphagia/Cough: on D2 nectar thick liquids. Educated patient and husband on aspiration risk and need for nectar liquids.              --flutter valve added.             --check CXR - 9/29 no acute cardiopulmonary process  - Upgraded to DYS 3 diet 9/29             - 10/10 Barium swallow; no aspiration but some deep penetration; cleared for D3 thin liquids with 5 cc Provalve cup  15. Blurred vision - added PRN eye drops. - improved              - Encouraged work with therapies - OT notified              - Can consider OP neuroptho eval if persistent.    16. Urinary urgency              - Start ditropan 2.5 mg TID 10/4 - much improved              -  PVR Qshift for 2 days to ensure no retention with new medication - 1x scan >200; continue to monitor               - 10/9 no further high scans; continue current regimen   17. Rhinorrhea - improved -Will start flonase 10/8  LOS: 12 days A FACE TO [place] 01/06/2022, 10:48 AM

## 2022-01-06 NOTE — Progress Notes (Signed)
Physical Therapy Session Note  Patient Details  Name: Lauren [name] MRN: [id number] Date of Birth: 02/05/1951  Today's Date: 01/06/2022 PT Individual Time: 1300-1345 PT Individual Time Calculation (min): 45 min   Short Term Goals: Week 2:  PT Short Term Goal 1 (Week 2): STGs = LTGs  Skilled Therapeutic Interventions/Progress Updates:     Pt received seated in [name] and agrees to therapy. No complaint of pain. Pt reports needing to use restroom. Sit to stand and ambulatory transfer to toilet with cues for sequencing and positioning, with CGA overall. WC transport to gym for time management. Pt tasked with ambulating 400' without AD and with cue to "walk as carefully as possible, focusing on walking and doing best not to become distracted". Pt completes 400' without any LOBs, requiring CGA and having x1-2 slight staggers but no overt LOBs. PT provides cues for positioning for safety, maintaining consistent speed to decrease risk for falls, and sequencing for safe transfer back to WC.  Pt then tasked with performing same distance ambulation, without AD, and while naming supermarket items with each letter of the alphabet to provide cognitive challenge overlaying ambulation. Pt completes distance without any LOBs, but does have notably slower gait speed.   Pt performs NMR for standing balance, performing "push and release" test, in which pt leans back against PT, then PT counts to 3 aloud and releases pt. Performed to test right reactions and promote increased step length, as well as adjustments to balance strategies, as pt tends to have full LOBs when step sequencing is not optimal. With "push and release" test, pt initially has complete LOBs on each attempt, requiring TotalA to prevent fall. PT educates on importance of taking large step backward, as well as shifting weight into anterior portion of feet. Pt improves performance and by the end of activity is consistently completing righting reaction  without physical assistance.  WC transport back to room. Left seated in WC with alarm intact and all needs within reach.  Therapy Documentation Precautions:  Precautions Precautions: Fall Precaution Comments: watch O2 Restrictions Weight Bearing Restrictions: No    Therapy/Group: Individual Therapy  Breck Coons, PT, DPT 01/06/2022, 4:35 PM

## 2022-01-06 NOTE — Progress Notes (Signed)
Modified Barium Swallow Progress Note  Patient Details  Name: Lauren [name] MRN: [id number] Date of Birth: [birth year]  Today's Date: 01/06/2022  Modified Barium Swallow completed.  Full report located under Chart Review in the Imaging Section.  Brief recommendations include the following:  Clinical Impression  Patient demonstrates a moderate oral and a mild pharyngeal dysphagia. Patient's oral phase is characterized by inconsistent bolus size with decreased bolus cohesion, premature spillage, and inconsistent timing of swallowing trigger resulting in intermittent penetration with thin liquids via cup and intermittent deep trace penetration to the cords with thin liquids via straw with sensation.  Patient with prolonged mastication and mild-moderate oral residue with solid textures. Due to patient's impulsivity, decreased attention and mild-moderate oral residue, recommend patient continue Dys. 3 textures with thin liquids via PROVALE cup for now with ongoing trials of thin via straw with SLP only. Educated patient and family (daughter) regarding results and recommendations, both verbalized understanding and agreement.    Swallow Evaluation Recommendations       SLP Diet Recommendations: Dysphagia 3 (Mech soft) solids;Thin liquid   Liquid Administration via: Cup (PROVALE Cup)   Medication Administration: Crushed with puree   Supervision: Full supervision/cueing for compensatory strategies;Patient able to self feed   Compensations: Minimize environmental distractions;Lingual sweep for clearance of pocketing;Slow rate;Small sips/bites;Monitor for anterior loss;Follow solids with liquid   Postural Changes: Remain semi-upright after after feeds/meals (Comment);Seated upright at 90 degrees   Oral Care Recommendations: Oral care BID        Angeline Trick 01/06/2022,11:06 AM

## 2022-01-06 NOTE — Progress Notes (Addendum)
Occupational Therapy Session Note  Patient Details  Name: Lauren [name] MRN: [id number] Date of Birth: [birth year]  Today's Date: 01/06/2022 Session 1 OT Individual Time: 4174-0814 OT Individual Time Calculation (min): 70 min   Session 2 OT Individual Time: 1415-1500 OT Individual Time Calculation (min): 45 min    Short Term Goals: Week 2:  OT Short Term Goal 1 (Week 2): LTG=STG 2/2 ELOS  Skilled Therapeutic Interventions/Progress Updates:  Session 1   Pt greeted seated EOB finishing breakfast and agreeable to OT treatment session. Pt able to manipulate feeding utensil with L hand and bring to mouth without built up handles. Min cues to check for pocketing on L side. Pt easily distracted and needed cues to maintain attention to food.  Pt declined need for any BADL tasks this morning. Discussed patients vision and how the adaptations of using paper books was going. Pt stated this has greatly improved her vison and comprehension with books. Standing balance/endurance standing at the sink for grooming tasks. Pt needed cues for sequencing and thoroughness to wash off dentures. She alse needed min A for balance with lateral lean to the [employer] mirror feedback to encourge midline. Functional ambulation to therapy gym without AD and MIN/CGA. Worked on L hand [place] with graded peg board task with focus on in-hand manipulation, translation, and rotation of pegs. Patient needed multiple cues to understand that when we were done, she needed to empty the board. She kept taking them out and putting them back in.  Standing balance/endurance and reaching with L hand to pull clothes pins from gait belt (to simulate clothing management) then reach with L hand and place on basketball net. OT placed 1 inch step under L leg to help bring balance to midline. Min/CGA for balance. Pt returned to room and was handed off to transport for MBS.  Session 2 Pt greeted seated in wc finishing lunch. Handoff to OT  from nursing staff providing supervision. Pt with one episode of coughing using Provale cup with thin liquid. Pt also answered phone call and was tearful during conversation. Pt stated her emotions have been all over the place. Educated on emotional liability s/p stroke. Pt then ambulated to the bathroom w/ RW and CGA. Pt sat on toilet and voided bladder, peri-care completed in sitting with supervision. Pt then ambulated to therapy gym w/ RW and CGA. Worked on bimanual beading task with focus on L hand [place]. The gym was initially very crowded and pt had increased difficulty maintaining attention to task. After distractions calmed down, pt could attend to place beads without cues. Improved L FMC with repetition. Progressed to small peg board task with pegs placed in diagonal pattern. This task also worked on divided attention having pt look from pattern sheet, to the board, and to the box with pegs. Pt ambulated back to room with RW and CGA. Pt returned to bed and left semi-reclined with bed alarm on, call bell in reach, and needs met.   Therapy Documentation Precautions:  Precautions Precautions: Fall Precaution Comments: watch O2 Restrictions Weight Bearing Restrictions: No Pain: Pain Assessment Pain Scale: 0-10 Pain Score: 0-No painDenies pain   Therapy/Group: Individual Therapy  Valma Cava 01/06/2022, 3:00 PM

## 2022-01-06 NOTE — Progress Notes (Signed)
Patient ID: Lauren [name], female   DOB: [birth year], 71 y.o.   MRN: [id number]  SW met with pt and pt dtr, and called pt husband [name] to provide updates from team conference, and d/c date remains 10/13. SW discussed still waiting on final d/c recs and SW will follow-up to confirm. If outpatient, prefers [facility] location.   Loralee Pacas, MSW, [place] Office: [phone] Cell: [phone] Fax: [phone]

## 2022-01-06 NOTE — Patient Care Conference (Signed)
Inpatient RehabilitationTeam Conference and Plan of Care Update Date: 01/06/2022   Time: 10:45 AM    Patient Name: Lauren [name]      Medical Record Number: [id number]  Date of Birth: 1950-04-08 Sex: Female         Room/Bed: 4M13C/4M13C-01 Payor Info: Payor: Plain / Plan: BCBS/[employer] PPO / Product Type: *No Product type* /    Admit Date/Time:  12/25/2021 11:50 AM  Primary Diagnosis:  Acute ischemic right MCA stroke [facility])  Hospital Problems: Principal Problem:   Acute ischemic right MCA stroke [place] [facility]) Active Problems:   History of bipolar disorder    Expected Discharge Date: Expected Discharge Date: 01/09/22  Team Members Present: Physician leading conference: Other (comment) (Dr Durel Salts) Social Worker Present: Loralee Pacas, [place] Nurse Present: Other (comment) Tacy Learn, RN) PT Present: Canary Brim, PT OT Present: Cherylynn Ridges, OT SLP Present: Weston Anna, SLP PPS Coordinator present : Gunnar Fusi, SLP     Current Status/Progress Goal Weekly Team Focus  Bowel/Bladder   Continent of b/b. LBM: 10/9  Remain continent; Offer/Assist w/ toileting q 2-4 hrs or PRN  Assess toileting needs q shift & as needed.   Swallow/Nutrition/ Hydration   Dys. 3 textures with nectar-thick liquids, trials of thin with MBS today to assess swallow function  Min A  tolerance of diet, use of swallowing compensatory strategies   ADL's   CGA overall, but moderate cues, Min A for walking with RW due to distractions and LOB  supervision overall  cognitive retraining, dc palnning, pt/family education, attention, self-care retraining   Mobility   supervision bed mobility, CGA sit to stand and simple transfers, CGA and occasional minA/modA with ambulation when becoming distracted and losing balance, minA stairs  Supervision  family ed, DC prep, L hemibody NMR and L sided attention, balance, ambulation   Communication             Safety/Cognition/ Behavioral  Observations  Min A  Min A  functional problem solving, sustained attention, emergent awareness, safety awareness and recall with use of strategies   Pain   Pt denies pain.  Remain pain free.  Assess pain q shift & PRN.   Skin   Bruising to bilat. arms. Skin intact.  Remain free from any skin breakdown; Maintain skin integrity.  Assess skin q shift & PRN     Discharge Planning:  D/c to home with support from husband and family.   Team Discussion: Acute ischemic right MCA stroke. Continent B/B. Ditropan added for urinary frequency. Pain managed with PRNs. Skin CDI. Patient requires close supervision with slow processing with lots of cuing.  Patient on target to meet rehab goals: yes, diet upgraded D3 with thins in provale cup, no straws  *See Care Plan and progress notes for long and short-term goals.   Revisions to Treatment Plan:  Medication adjustments, monitor labs  Teaching Needs: Medications, safety, gait/transfer training, etc.   Current Barriers to Discharge: Decreased caregiver support, Insurance for SNF coverage, and Medication compliance  Possible Resolutions to Barriers: Family education, order recommended DME     Medical Summary Current Status: stable  Barriers to Discharge: Behavior;Medical stability;Medication compliance;Nutrition means;Wound care  Barriers to Discharge Comments: dysphagia, urinary frequency, impulsive behaviors and difficulty processing Possible Resolutions to Barriers/Weekly Focus: tobacco cessation resources per nursing, SLP swallow eval, cueing and family support   Continued Need for Acute Rehabilitation Level of Care: The patient requires daily medical management by a physician with specialized training in  physical medicine and rehabilitation for the following reasons: Direction of a multidisciplinary physical rehabilitation program to maximize functional independence : Yes Medical management of patient stability for increased activity during  participation in an intensive rehabilitation regime.: Yes Analysis of laboratory values and/or radiology reports with any subsequent need for medication adjustment and/or medical intervention. : Yes   I attest that I was present, lead the team conference, and concur with the assessment and plan of the team.   Ernest Pine 01/06/2022, 2:33 PM

## 2022-01-07 NOTE — Progress Notes (Signed)
Physical Therapy Session Note  Patient Details  Name: Lauren [name] MRN: [id number] Date of Birth: [birth year]  Today's Date: 01/07/2022 PT Individual Time: 1120-1200 PT Individual Time Calculation (min): 40 min   Short Term Goals: Week 2:  PT Short Term Goal 1 (Week 2): STGs = LTGs  Skilled Therapeutic Interventions/Progress Updates: Pt presented in w/c agreeable to therapy. Pt denies pain but noted to be highly distractible initially. Session focused on balance, dual tasking, and ambulation. Pt ambulated from room to rehab gym with RW and CGA. Pt required cues to focus on task and to maintain safe management of RW as pt initially lifting RW every few feet for repositioning. PTA set up number disks in hallway and pt completed task of finding numbers in order. Pt did require intermittent cues to stay on task and x 3 reminders of which number she was to look for next (PTA did this by showing her which number she had just found). Pt then participated in dual task activity of using rebounder while calling out names of states. Pt did this in alphabetical order as she stated this was the only way she could remember. Pt was able to complete task successfully until she reached letter G then would stop rebounding ball to think of state. Pt was unable to complete task successfully after letter K. After seated rest activity resumed with pt performing similar activity but naming cities also in chronological  order. Pt also performed x 10 with rotation L/R without dual task for dynamic balance. Participated in [employer] LOS static setting x 2. On first attempt pt would intermittently hold onto arm bars with PTA providing multimodal cues for increased hip and ankle strategy with score of 18%. On second attempt pt performed without UE support and CGA. Pt with improved score of 33%. Pt then ambulated back to room without AD but holding prevalon cup with CGA. Pt with x 1 slight LOB to R but able to correct without assist.  Pt returned to w/c at end of session and left with husband present and current needs met.      Therapy Documentation Precautions:  Precautions Precautions: Fall Precaution Comments: watch O2 Restrictions Weight Bearing Restrictions: No General:   Vital Signs: Therapy Vitals Temp: 98.2 F (36.8 C) Temp Source: Oral Pulse Rate: 74 Resp: 17 BP: 102/61 Patient Position (if appropriate): Lying Oxygen Therapy SpO2: 95 % O2 Device: Room Air Pain:   Mobility:   Locomotion :    Trunk/Postural Assessment :    Balance:   Exercises:   Other Treatments:      Therapy/Group: Individual Therapy  Luan Urbani 01/07/2022, 4:02 PM

## 2022-01-07 NOTE — Discharge Summary (Signed)
Physician Discharge Summary  Patient ID: Lauren [name] MRN: [id number] DOB/AGE: [birth year] 70 y.o.  Admit date: 12/25/2021 Discharge date: 01/09/2022  Discharge Diagnoses:  Principal Problem:   Acute ischemic right MCA stroke [facility]) Active Problems:   History of bipolar disorder   Dysphagia   Discharged Condition: stable  Significant Diagnostic Studies: DG Swallowing Func-Speech Pathology  Result Date: 01/06/2022 Table formatting from the original result was not included. Objective Swallowing Evaluation: Type of Study: MBS-Modified Barium Swallow Study  Patient Details Name: Lauren [name] MRN: [id number] Date of Birth: 1950-06-20 Today's Date: 01/06/2022 Past Medical History: Past Medical History: Diagnosis Date  Anxiety   Bipolar 1 disorder ([place])   Bipolar disorder ([place])   Breast cancer ([place])   Breast cancer ([place])   Bronchitis   CAD (coronary artery disease)   DDD (degenerative disc disease), cervical   Diabetes mellitus without complication ([place])   Emphysema lung ([place])   Hypertension   ICH (intracerebral hemorrhage) ([place])   Lung cancer ([place])   Occasional tremors   Parotid mass   PUD (peptic ulcer disease)  Past Surgical History: Past Surgical History: Procedure Laterality Date  BREAST LUMPECTOMY    CORONARY ANGIOPLASTY WITH STENT PLACEMENT    GASTRIC BYPASS  2016  KNEE ARTHROSCOPY    REPAIR OF PERFORATED ULCER  07/2021 HPI: See H&P  Subjective: pleasant, alert, a little fidgety/anxious  Recommendations for follow up therapy are one component of a multi-disciplinary discharge planning process, led by the attending physician.  Recommendations may be updated based on patient status, additional functional criteria and insurance authorization. Assessment / Plan / Recommendation   01/06/2022  10:55 AM Clinical Impressions Clinical Impression Patient demonstrates a moderate oral and a mild pharyngeal dysphagia. Patient's oral phase is characterized by inconsistent bolus size with decreased bolus  cohesion, premature spillage, and inconsistent timing of swallowing trigger resulting in intermittent penetration with thin liquids via cup and intermittent deep trace penetration to the cords with thin liquids via straw with sensation.  Patient with prolonged mastication and mild-moderate oral residue with solid textures. Due to patient's impulsivity, decreased attention and mild-moderate oral residue, recommend patient continue Dys. 3 textures with thin liquids via PROVALE cup for now with ongoing trials of thin via straw with SLP only. Educated patient and family (daughter) regarding results and recommendations, both verbalized understanding and agreement.  SLP Visit Diagnosis Dysphagia, oropharyngeal phase (R13.12) Impact on safety and function Mild aspiration risk;Moderate aspiration risk     12/24/2021   9:46 AM Treatment Recommendations Treatment Recommendations Therapy as outlined in treatment plan below     01/06/2022  10:56 AM Prognosis Prognosis for Safe Diet Advancement Good Barriers to Reach Goals Severity of deficits   01/06/2022  10:55 AM Diet Recommendations SLP Diet Recommendations Dysphagia 3 (Mech soft) solids;Thin liquid Liquid Administration via Cup Medication Administration Crushed with puree Compensations Minimize environmental distractions;Lingual sweep for clearance of pocketing;Slow rate;Small sips/bites;Monitor for anterior loss;Follow solids with liquid Postural Changes Remain semi-upright after after feeds/meals (Comment);Seated upright at 90 degrees     01/06/2022  10:55 AM Other Recommendations Oral Care Recommendations Oral care BID   12/24/2021   9:46 AM Frequency and Duration  Speech Therapy Frequency (ACUTE ONLY) min 2x/week Treatment Duration 2 weeks     01/06/2022  10:57 AM Oral Phase Oral Phase Impaired Oral - Nectar Cup NT Oral - Nectar Straw NT Oral - Thin Teaspoon Left anterior bolus loss;Decreased bolus cohesion;Premature spillage Oral - Thin Cup Decreased bolus  cohesion;Premature spillage;Left anterior bolus loss Oral -  Thin Straw Left anterior bolus loss;Decreased bolus cohesion;Premature spillage Oral - Puree Premature spillage;Left anterior bolus loss;Delayed oral transit Oral - Mech Soft Impaired mastication;Left anterior bolus loss;Decreased bolus cohesion;Weak lingual manipulation;Delayed oral transit Oral - Pill NT    01/06/2022  10:58 AM Pharyngeal Phase Pharyngeal Phase Impaired Pharyngeal- Nectar Cup NT Pharyngeal- Nectar Straw NT Pharyngeal- Thin Teaspoon Delayed swallow initiation-vallecula;Penetration/Aspiration before swallow Pharyngeal Material enters airway, remains ABOVE vocal cords and not ejected out;Material does not enter airway Pharyngeal- Thin Cup Delayed swallow initiation-vallecula;Delayed swallow initiation-pyriform sinuses;Penetration/Aspiration during swallow Pharyngeal Material enters airway, remains ABOVE vocal cords and not ejected out;Material enters airway, CONTACTS cords and then ejected out Pharyngeal- Thin Straw Delayed swallow initiation-vallecula;Delayed swallow initiation-pyriform sinuses Pharyngeal Material does not enter airway;Material enters airway, remains ABOVE vocal cords and not ejected out;Material enters airway, CONTACTS cords and not ejected out Pharyngeal Material does not enter airway Pharyngeal- Mechanical Soft Delayed swallow initiation-vallecula Pharyngeal Material does not enter airway    12/24/2021   9:44 AM Cervical Esophageal Phase  Cervical Esophageal Phase Impaired Cervical Esophageal Comment cervical esophagus appeared dilated and with suspected cervical osteophytes (no radiologist present to confirm) [place], [place] 01/06/2022, 12:06 PM      Lauren Anna, MA, CCC-SLP                DG Chest 2 View  Result Date: 12/26/2021 CLINICAL DATA:  Cough EXAM: CHEST - 2 VIEW COMPARISON:  December 21, 2021. FINDINGS: The heart size and mediastinal contours are within normal limits. Aortic atherosclerosis. Coronary  artery calcifications. No focal airspace consolidation. Chronic bronchitic lung changes with pulmonary hyperinflation and emphysema. Similar left upper lobe pulmonary nodule. The visualized skeletal structures are unremarkable. IMPRESSION: Similar chronic lung changes without acute cardiopulmonary disease. Electronically Signed   By: Dahlia Bailiff M.D.   On: 12/26/2021 09:17   DG Swallowing Func-Speech Pathology  Result Date: 12/24/2021 Table formatting from the original result was not included. Objective Swallowing Evaluation: Type of Study: MBS-Modified Barium Swallow Study  Patient Details Name: Trysten Berti MRN: [id number] Date of Birth: 1950/10/02 Today's Date: 12/24/2021 Time: SLP Start Time (ACUTE ONLY): 0820 -SLP Stop Time (ACUTE ONLY): 0835 SLP Time Calculation (min) (ACUTE ONLY): 15 min Past Medical History: Past Medical History: Diagnosis Date  Diabetes mellitus without complication ([place])   Hypertension  Past Surgical History: No past surgical history on file. HPI: Patient is a 71 y.o. female with PMH: CAD, HTN, DM-2, COPD, GERD, lung nodules, right parotid mass,  tobacco dependency, ADD, stomach ulcers, tremor disorder, prior hemorrhagic stroke, HLD, thrombocytosis. She presented to the hospital on 12/19/21 with left sided weakness, slurred speech and left sided facial droop. Imaging showed moderately sized right MCA CVA including dense involvement of the right basal ganglia, insula and anterior operculum. She was out of window for intervention; some developing midline shift. She failed Yale swallow with RN and SLP ordered for swallow evaluation.  Subjective: pleasant, alert, a little fidgety/anxious  Recommendations for follow up therapy are one component of a multi-disciplinary discharge planning process, led by the attending physician.  Recommendations may be updated based on patient status, additional functional criteria and insurance authorization. Assessment / Plan / Recommendation   12/24/2021    9:46 AM Clinical Impressions Clinical Impression Patient presents with a mild oropharyngeal and a mild-moderate esophageal dysphagia as per this modified barium swallow study. During oral phase, she exhibited decreased bolus cohesion with liquids and delays in mastication and anterior to posterior transit of puree and soft solid textures. During  pharyngeal phase, swallow was initiated at level of vallecular sinus with puree and soft solids as well as with teaspoon sip of thin liquid. Swallow was initiated at level of pyriform sinus with thin and nectar thick liquids. Flash, trace penetration occured with thin liquids via cup sips and with nectar thick liquids. When taking larger sip of barium to try to aid in clearing soft solid barium stasis in esophagus (upper thoracic portion), patient did exhibit trace, sensed aspiration of nectar thick liquid barium during the swallow. SLP did observe that cervical esophagus appeared dilated and suspected osteophytes present. During sweep of esophagus, stasis of soft solid barium in upper thoracic portion of esophagus observed and sip of nectar thick liquids did aid in transiting fully; also observed possible dysmotility and retrograde movement of barium in distal esophagus. SLP is recommending to continue with Dys 3 (mechanical soft) solids and nectar thick liquids but to allow patient to have thin liquids in between meals. SLP Visit Diagnosis Dysphagia, oropharyngeal phase (R13.12);Dysphagia, pharyngoesophageal phase (R13.14) Impact on safety and function Mild aspiration risk     12/24/2021   9:46 AM Treatment Recommendations Treatment Recommendations Therapy as outlined in treatment plan below     12/24/2021   9:58 AM Prognosis Prognosis for Safe Diet Advancement Good Barriers to Reach Goals Severity of deficits   12/24/2021   9:46 AM Diet Recommendations SLP Diet Recommendations Dysphagia 3 (Mech soft) solids;Nectar thick liquid Liquid Administration via Cup;Straw Medication  Administration Whole meds with liquid Compensations Minimize environmental distractions;Lingual sweep for clearance of pocketing Postural Changes Seated upright at 90 degrees;Remain semi-upright after after feeds/meals (Comment)     12/24/2021   9:46 AM Other Recommendations Oral Care Recommendations Oral care BID Follow Up Recommendations Acute inpatient rehab (3hours/day) Assistance recommended at discharge Frequent or constant Supervision/Assistance Functional Status Assessment Patient has had a recent decline in their functional status and demonstrates the ability to make significant improvements in function in a reasonable and predictable amount of time.   12/24/2021   9:46 AM Frequency and Duration  Speech Therapy Frequency (ACUTE ONLY) min 2x/week Treatment Duration 2 weeks     12/24/2021   9:37 AM Oral Phase Oral Phase Impaired Oral - Nectar Cup Weak lingual manipulation;Reduced posterior propulsion;Piecemeal swallowing Oral - Nectar Straw Weak lingual manipulation;Reduced posterior propulsion;Piecemeal swallowing Oral - Thin Teaspoon Weak lingual manipulation;Reduced posterior propulsion Oral - Thin Cup Reduced posterior propulsion;Decreased bolus cohesion;Weak lingual manipulation Oral - Puree Reduced posterior propulsion;Weak lingual manipulation Oral - Mech Soft Weak lingual manipulation;Reduced posterior propulsion;Impaired mastication Oral - Pill Reduced posterior propulsion;Weak lingual manipulation    12/24/2021   9:40 AM Pharyngeal Phase Pharyngeal Phase Impaired Pharyngeal- Nectar Cup Delayed swallow initiation-pyriform sinuses Pharyngeal- Nectar Straw Delayed swallow initiation-pyriform sinuses;Trace aspiration;Penetration/Aspiration during swallow;Reduced airway/laryngeal closure Pharyngeal Material enters airway, passes BELOW cords and not ejected out despite cough attempt by patient;Material enters airway, remains ABOVE vocal cords then ejected out Pharyngeal- Thin Teaspoon Delayed swallow  initiation-vallecula Pharyngeal- Thin Cup Delayed swallow initiation-pyriform sinuses;Penetration/Aspiration during swallow Pharyngeal- Puree Reduced pharyngeal peristalsis Pharyngeal- Pill Reduced pharyngeal peristalsis    12/24/2021   9:44 AM Cervical Esophageal Phase  Cervical Esophageal Phase Impaired Cervical Esophageal Comment cervical esophagus appeared dilated and with suspected cervical osteophytes (no radiologist present to confirm) Sonia Baller, MA, CCC-SLP Speech Therapy                     CT HEAD WO CONTRAST (5MM)  Result Date: 12/22/2021 CLINICAL DATA:  71 year old female code  stroke presentation with right MCA infarct visible on presentation CT, distal M1 LV0 with reconstitution. EXAM: CT HEAD WITHOUT CONTRAST TECHNIQUE: Contiguous axial images were obtained from the base of the skull through the vertex without intravenous contrast. RADIATION DOSE REDUCTION: This exam was performed according to the departmental dose-optimization program which includes automated exposure control, adjustment of the mA and/or kV according to patient size and/or use of iterative reconstruction technique. COMPARISON:  Brain MRI both without and with contrast yesterday. Head CT 12/20/2021 and earlier. FINDINGS: Brain: Mild evolution of cytotoxic edema on CT since 12/20/2021. No hemorrhagic transformation. Compared to the presentation CT, mild mass effect has developed in the right hemisphere including trace leftward midline shift and partially effaced right lateral ventricle. But the extent of cytotoxic edema has not significantly changed. Left hemisphere and posterior fossa gray-white differentiation maintained. Basilar cisterns remain normal. Vascular: Calcified atherosclerosis at the skull base. Skull: No acute osseous abnormality identified. Sinuses/Orbits: Visualized paranasal sinuses and mastoids are stable and well aerated. Other: Mild rightward gaze.  Stable scalp soft tissues. IMPRESSION: 1. Right MCA  infarct with stable extent but evolved edema and mild mass effect since presentation. Trace leftward midline shift. No malignant hemorrhagic transformation. 2. No new intracranial abnormality. Electronically Signed   By: Genevie Ann M.D.   On: 12/22/2021 05:30     Labs:  Basic Metabolic Panel:    Latest Ref Rng & Units 01/05/2022    6:39 AM 12/29/2021    6:55 AM 12/26/2021    6:31 AM  BMP  Glucose 70 - 99 mg/dL 87  87  86   BUN 8 - 23 mg/dL 17  13  13    Creatinine 0.44 - 1.00 mg/dL 0.52  0.62  0.61   Sodium 135 - 145 mmol/L 143  140  140   Potassium 3.5 - 5.1 mmol/L 4.3  3.8  4.2   Chloride 98 - 111 mmol/L 108  105  105   CO2 22 - 32 mmol/L 26  28  30    Calcium 8.9 - 10.3 mg/dL 9.4  8.8  8.9      CBC:    Latest Ref Rng & Units 01/05/2022    6:39 AM 12/29/2021    6:55 AM 12/26/2021    6:31 AM  CBC  WBC 4.0 - 10.5 K/uL 6.9  8.1  8.3   Hemoglobin 12.0 - 15.0 g/dL 13.6  13.8  13.7   Hematocrit 36.0 - 46.0 % 40.7  42.7  42.8   Platelets 150 - 400 K/uL 460  548  396      CBG: No results for input(s): "GLUCAP" in the last 168 hours.  Brief HPI:   Briahnna Harries is a 71 y.o. female with history of CAD, HTN, T2DM, RML lung cancer, left frontoparietal ICH 07/2021 with residual RLE weakness and falls, bipolar disorder who was admitted on 12/20/2021 after falling out of bed with left-sided weakness and bleeding from the mouth.  She was noted to be hemiplegic left facial droop, dysarthria and right gaze deviation.  CT head was positive for right MCA hyperdense sign with emergent LVO noted with pseudonormalization.  Patient last seen normal night before and was felt to be out of window for intervention.  MRI brain showed right MCA stroke with edema and she was treated with hypertonic saline.  She was started on aspirin with recommendations to add Plavix at time of discharge.  She continued to be limited by left-sided weakness with balance deficits, SOB with ambulation,  anxiety as well as impulsivity.   CIR was recommended due to functional decline.   Hospital Course: Keir Foland was admitted to rehab 12/25/2021 for inpatient therapies to consist of PT, ST and OT at least three hours five days a week. Past admission physiatrist, therapy team and rehab RN have worked together to provide customized collaborative inpatient rehab.  Her blood pressures were monitored on TID basis and were reasonably controlled.  She was treated with IV fluids briefly for asymptomatic hypotension.  MDM was resumed with education and encouragement for consistent use to help with respiratory status.  Flutter valve was added for pulmonary hygiene.  Chest x-ray done due to reports of cough and showed NAD.  She was maintained on dysphagia 3 diet with nectar liquids and aspiration precautions.  Swallow function improved MBS was repeated and she was advanced to thin liquids via provide cup.  Team has provided ego support and anxiety/lability has resolved.  She was noted to have urinary urgency which has improved with addition of low-dose Ditropan 3 times daily.  Follow-up check of BMET shows electrolytes and renal status to be within normal limits.  CBC shows H&H to be stable on DAPT.  She is to discontinue aspirin after 6 days.  Inattention is improving and she she is showing improvement in safety awareness.  Supervision is recommended for safety.  She will continue to receive outpatient PT, OT and ST at [facility] after discharge.    Rehab course: During patient's stay in rehab weekly team conferences were held to monitor patient's progress, set goals and discuss barriers to discharge. At admission, patient required max assist with mobility and mod assist with basic ADL tasks. She exhibited moderate oro-motor dysphagia with decreased safety awareness, impaired speech intelligibility, poor attention and SLUMS score 18/26 on cognitive evaluation. She  has had improvement in activity tolerance, balance,  postural control as well as ability to compensate for deficits. She has had improvement in functional use LUE  and LLE as well as improvement in awareness. She requires supervision to [facility] for ADL tasks. She is requires supervision with transfers and to ambulate 200' without AD. She requires min verbal cues to complete functional and familiar tasks. She is tolerating regular diet and thin liquids via Provale cup.     Disposition: Home  Diet: D3, thin liquids per Proval cup  Special Instructions:   Discharge Instructions     Ambulatory referral to Neurology   Complete by: As directed    An appointment is requested in approximately: 3-4 weeks stroke follow up   Ambulatory referral to Physical Medicine Rehab   Complete by: As directed       Allergies as of 01/09/2022       Reactions   Latex Rash   Prozac [fluoxetine] Rash        Medication List     STOP taking these medications    HYDROcodone-acetaminophen 5-325 MG tablet Commonly known as: NORCO/VICODIN   hydrOXYzine 25 MG tablet Commonly known as: ATARAX       TAKE these medications    acetaminophen 325 MG tablet Commonly known as: TYLENOL Take 1-2 tablets (325-650 mg total) by mouth every 4 (four) hours as needed for mild pain. What changed:  how much to take when to take this   amphetamine-dextroamphetamine 20 MG tablet Commonly known as: ADDERALL Take 20 mg by mouth every morning.   Aspirin Low Dose 81 MG tablet Generic drug: aspirin EC Take 1 tablet (  81 mg total) by mouth daily. Continue this for 6 days then stop. What changed: additional instructions   atorvastatin 40 MG tablet Commonly known as: LIPITOR Take 1 tablet (40 mg total) by mouth daily.   Breztri Aerosphere 160-9-4.8 MCG/ACT Aero Generic drug: Budeson-Glycopyrrol-Formoterol Inhale 2 puffs into the lungs 2 (two) times daily.   busPIRone 15 MG tablet Commonly known as: BUSPAR Take 2 tablets (30 mg total) by mouth 3 (three) times  daily. What changed: medication strength   CALCIUM PO Take 1 tablet by mouth daily.   clopidogrel 75 MG tablet Commonly known as: PLAVIX Take 1 tablet (75 mg total) by mouth daily.   fluticasone 50 MCG/ACT nasal spray Commonly known as: FLONASE Place 1 spray into both nostrils daily as needed for allergies or rhinitis.   lamoTRIgine 100 MG tablet Commonly known as: LAMICTAL Take 2 tablets (200 mg total) by mouth daily.   multivitamin tablet Take 1 tablet by mouth daily.   naphazoline-glycerin 0.012-0.25 % Soln Commonly known as: CLEAR EYES REDNESS Place 1-2 drops into both eyes 4 (four) times daily as needed for eye irritation (dry eyes, blurred vision).   oxybutynin 5 MG tablet Commonly known as: DITROPAN Take 1 tablet (5 mg total) by mouth 3 (three) times daily.   pantoprazole 40 MG tablet Commonly known as: PROTONIX Take 1 tablet (40 mg total) by mouth daily.   propranolol ER 60 MG 24 hr capsule Commonly known as: INDERAL LA Take 1 capsule (60 mg total) by mouth daily.   QUEtiapine 100 MG tablet Commonly known as: SEROQUEL Take 200 mg by mouth at bedtime.   Senexon-S 8.6-50 MG tablet Generic drug: senna-docusate Take 2 tablets by mouth at bedtime as needed for mild constipation.   traZODone 50 MG tablet Commonly known as: DESYREL Take 1 tablet (50 mg total) by mouth at bedtime.        Follow-up Information     Malachy Moan, MD Follow up.   Specialty: Family Medicine Why: Call in 1-2 days for post hospital follow up Contact information: [address] [place] [address] [phone]         Durel Salts C, DO Follow up.   Specialty: Physical Medicine and Rehabilitation Why: office will call you with follow up appointment Contact information: [address] [place] [address] [phone]         [place] Follow up.   Why: office will call you with follow up  appointment Contact information: [address]     [place] [address] [phone]                Signed: Bary Leriche 01/12/2022, 1:16 AM

## 2022-01-07 NOTE — Progress Notes (Signed)
PROGRESS NOTE   Subjective/Complaints:  Patient seen and evaluated at bedside. Doing well, no complaints. Much happier with thin liquids and doing well with the provalve cup.   2x lage BM yesterday.   ROS: Denies fevers, HA, hills, N/V, abdominal pain, constipation, diarrhea, SOB, cough, chest pain, new weakness or paraesthesias.    Objective:   DG Swallowing Func-Speech Pathology  Result Date: 01/06/2022 Table formatting from the original result was not included. Objective Swallowing Evaluation: Type of Study: MBS-Modified Barium Swallow Study  Patient Details Name: Lauren [name] MRN: [id number] Date of Birth: 13-Feb-1951 Today's Date: 01/06/2022 Past Medical History: Past Medical History: Diagnosis Date  Anxiety   Bipolar 1 disorder ([place])   Bipolar disorder ([place])   Breast cancer ([place])   Breast cancer ([place])   Bronchitis   CAD (coronary artery disease)   DDD (degenerative disc disease), cervical   Diabetes mellitus without complication ([place])   Emphysema lung ([place])   Hypertension   ICH (intracerebral hemorrhage) ([place])   Lung cancer ([place])   Occasional tremors   Parotid mass   PUD (peptic ulcer disease)  Past Surgical History: Past Surgical History: Procedure Laterality Date  BREAST LUMPECTOMY    CORONARY ANGIOPLASTY WITH STENT PLACEMENT    GASTRIC BYPASS  2016  KNEE ARTHROSCOPY    REPAIR OF PERFORATED ULCER  07/2021 HPI: See H&P  Subjective: pleasant, alert, a little fidgety/anxious  Recommendations for follow up therapy are one component of a multi-disciplinary discharge planning process, led by the attending physician.  Recommendations may be updated based on patient status, additional functional criteria and insurance authorization. Assessment / Plan / Recommendation   01/06/2022  10:55 AM Clinical Impressions Clinical Impression Patient demonstrates a moderate oral and a mild pharyngeal dysphagia. Patient's oral phase is characterized by  inconsistent bolus size with decreased bolus cohesion, premature spillage, and inconsistent timing of swallowing trigger resulting in intermittent penetration with thin liquids via cup and intermittent deep trace penetration to the cords with thin liquids via straw with sensation.  Patient with prolonged mastication and mild-moderate oral residue with solid textures. Due to patient's impulsivity, decreased attention and mild-moderate oral residue, recommend patient continue Dys. 3 textures with thin liquids via PROVALE cup for now with ongoing trials of thin via straw with SLP only. Educated patient and family (daughter) regarding results and recommendations, both verbalized understanding and agreement.  SLP Visit Diagnosis Dysphagia, oropharyngeal phase (R13.12) Impact on safety and function Mild aspiration risk;Moderate aspiration risk     12/24/2021   9:46 AM Treatment Recommendations Treatment Recommendations Therapy as outlined in treatment plan below     01/06/2022  10:56 AM Prognosis Prognosis for Safe Diet Advancement Good Barriers to Reach Goals Severity of deficits   01/06/2022  10:55 AM Diet Recommendations SLP Diet Recommendations Dysphagia 3 (Mech soft) solids;Thin liquid Liquid Administration via Cup Medication Administration Crushed with puree Compensations Minimize environmental distractions;Lingual sweep for clearance of pocketing;Slow rate;Small sips/bites;Monitor for anterior loss;Follow solids with liquid Postural Changes Remain semi-upright after after feeds/meals (Comment);Seated upright at 90 degrees     01/06/2022  10:55 AM Other Recommendations Oral Care Recommendations Oral care BID   12/24/2021   9:46 AM Frequency and  Duration  Speech Therapy Frequency (ACUTE ONLY) min 2x/week Treatment Duration 2 weeks     01/06/2022  10:57 AM Oral Phase Oral Phase Impaired Oral - Nectar Cup NT Oral - Nectar Straw NT Oral - Thin Teaspoon Left anterior bolus loss;Decreased bolus cohesion;Premature spillage  Oral - Thin Cup Decreased bolus cohesion;Premature spillage;Left anterior bolus loss Oral - Thin Straw Left anterior bolus loss;Decreased bolus cohesion;Premature spillage Oral - Puree Premature spillage;Left anterior bolus loss;Delayed oral transit Oral - Mech Soft Impaired mastication;Left anterior bolus loss;Decreased bolus cohesion;Weak lingual manipulation;Delayed oral transit Oral - Pill NT    01/06/2022  10:58 AM Pharyngeal Phase Pharyngeal Phase Impaired Pharyngeal- Nectar Cup NT Pharyngeal- Nectar Straw NT Pharyngeal- Thin Teaspoon Delayed swallow initiation-vallecula;Penetration/Aspiration before swallow Pharyngeal Material enters airway, remains ABOVE vocal cords and not ejected out;Material does not enter airway Pharyngeal- Thin Cup Delayed swallow initiation-vallecula;Delayed swallow initiation-pyriform sinuses;Penetration/Aspiration during swallow Pharyngeal Material enters airway, remains ABOVE vocal cords and not ejected out;Material enters airway, CONTACTS cords and then ejected out Pharyngeal- Thin Straw Delayed swallow initiation-vallecula;Delayed swallow initiation-pyriform sinuses Pharyngeal Material does not enter airway;Material enters airway, remains ABOVE vocal cords and not ejected out;Material enters airway, CONTACTS cords and not ejected out Pharyngeal Material does not enter airway Pharyngeal- Mechanical Soft Delayed swallow initiation-vallecula Pharyngeal Material does not enter airway    12/24/2021   9:44 AM Cervical Esophageal Phase  Cervical Esophageal Phase Impaired Cervical Esophageal Comment cervical esophagus appeared dilated and with suspected cervical osteophytes (no radiologist present to confirm) [name], [name] 01/06/2022, 12:06 PM      Weston Anna, MA, CCC-SLP                Recent Labs    01/05/22 0639  WBC 6.9  HGB 13.6  HCT 40.7  PLT 460*    Recent Labs    01/05/22 0639  NA 143  K 4.3  CL 108  CO2 26  GLUCOSE 87  BUN 17  CREATININE 0.52  CALCIUM 9.4      Intake/Output Summary (Last 24 hours) at 01/07/2022 1239 Last data filed at 01/06/2022 1828 Gross per 24 hour  Intake 827 ml  Output --  Net 827 ml         Physical Exam: Vital Signs Blood pressure 100/68, pulse 74, temperature 97.8 F (36.6 C), temperature source Oral, resp. rate 16, height 5' (1.524 m), weight 42.7 kg, SpO2 94 %. Constitutional: No apparent distress. Appropriate appearance for age.  HENT: No JVD. Neck Supple. Trachea midline. Atraumatic, normocephalic. +glasses Eyes: PERRLA. EOMI. Visual fields grossly intact.  Cardiovascular: RRR, no murmurs/rub/gallops. No Edema. Peripheral pulses 2+  Respiratory: CTAB. No rales, rhonchi, or wheezing. On RA. Good air movement.   Abdomen: + bowel sounds, normoactive. No distention or tenderness.  Skin: Mild DTI over sacrum, improved.  Neuro: AAO to person, place, time and event. Mild left inattention - resolved; L facial droop - mild MSK: antigravity and against resistance in all 4 extremities - unchanged    Assessment/Plan: 1. Functional deficits which require 3+ hours per day of interdisciplinary therapy in a comprehensive inpatient rehab setting. Physiatrist is providing close team supervision and 24 hour management of active medical problems listed below. Physiatrist and rehab team continue to assess barriers to discharge/monitor patient progress toward functional and medical goals  Care Tool:  Bathing    Body parts bathed by patient: Right arm, Left arm, Chest, Abdomen, Front perineal area, Buttocks, Right upper leg, Left upper leg, Face   Body parts bathed by  helper: Right lower leg, Left lower leg     Bathing assist Assist Level: Minimal Assistance - Patient > 75%     Upper Body Dressing/Undressing Upper body dressing   What is the patient wearing?: Pull over shirt    Upper body assist Assist Level: Minimal Assistance - Patient > 75%    Lower Body Dressing/Undressing Lower body dressing       What is the patient wearing?: Underwear/pull up, Pants     Lower body assist Assist for lower body dressing: Minimal Assistance - Patient > 75%     Toileting Toileting    Toileting assist Assist for toileting: Contact Guard/Touching assist     Transfers Chair/bed transfer  Transfers assist     Chair/bed transfer assist level: Minimal Assistance - Patient > 75%     Locomotion Ambulation   Ambulation assist      Assist level: Moderate Assistance - Patient 50 - 74% Assistive device: No Device Max distance: 150'   Walk 10 feet activity   Assist     Assist level: Moderate Assistance - Patient - 50 - 74% Assistive device: No Device   Walk 50 feet activity   Assist    Assist level: Moderate Assistance - Patient - 50 - 74% Assistive device: No Device    Walk 150 feet activity   Assist    Assist level: Moderate Assistance - Patient - 50 - 74% Assistive device: No Device    Walk 10 feet on uneven surface  activity   Assist     Assist level: Moderate Assistance - Patient - 50 - 74% Assistive device:  (R handrail)   Wheelchair     Assist Is the patient using a wheelchair?: Yes Type of Wheelchair: Manual    Wheelchair assist level: Total Assistance - Patient < 25% Max wheelchair distance: 150'    Wheelchair 50 feet with 2 turns activity    Assist        Assist Level: Total Assistance - Patient < 25%   Wheelchair 150 feet activity     Assist      Assist Level: Total Assistance - Patient < 25%   Blood pressure 100/68, pulse 74, temperature 97.8 F (36.6 C), temperature source Oral, resp. rate 16, height 5' (1.524 m), weight 42.7 kg, SpO2 94 %.  Medical Problem List and Plan: 1. Functional deficits secondary to R MCA stroke with L sensory deficits and L hemiparesis as well as dysphagia and L inattention             -patient may  shower             -ELOS/Goals: 10-14 days- supervision with PT, OT, SLP  -Continue CIR with  PT/OT/SLP  2.  Antithrombotics: -DVT/anticoagulation:  Pharmaceutical: Lovenox             -antiplatelet therapy: ASA. Per DC, "She has been started on only aspirin 81 mg p.o. daily instead of DAPT due to previous history of large ICH/hemorrhagic stroke." Can address with Neuro at OP f/u.   3. DDD Cervical spine/Pain Management: tylenol prn.   4. Mood/Behavior/Sleep:  LCSW to follow for evaluation and support.              - Hx  ADHD: On Adderall daily.              - Hx Bipolar disorder: Stable on home regimen of Lamictal, Seroquel and buspar for anxiety  - ?mania component w/ impulsivity; per family somewhat baseline  but behaviors have worsened s/p strokes              -antipsychotic agents:  Continue              - neuropsych consult next week if appropriate - performed 10/4, no new recs            - 9/30: Fall OOB, noted impulsive behaviors and poor safety awareness, however no further tele available in hospital; nursing to place belt alarm in bed             - 10/4 DC scheduled atarax d/t dry mouth, not home med. PRN available and not used.    5. Neuropsych/cognition: This patient is capable of making decisions on her own behalf. 6. Skin/Wound Care: Routine pressure relief measures.           - Sacral DTI - Sacral heart, change PRN  7. Fluids/Electrolytes/Nutrition: Monitor I/O. Check CMET in am.    Latest Ref Rng & Units 01/05/2022    6:39 AM 12/29/2021    6:55 AM 12/26/2021    6:31 AM  BMP  Glucose 70 - 99 mg/dL 87  87  86   BUN 8 - 23 mg/dL 17  13  13    Creatinine 0.44 - 1.00 mg/dL 0.52  0.62  0.61   Sodium 135 - 145 mmol/L 143  140  140   Potassium 3.5 - 5.1 mmol/L 4.3  3.8  4.2   Chloride 98 - 111 mmol/L 108  105  105   CO2 22 - 32 mmol/L 26  28  30    Calcium 8.9 - 10.3 mg/dL 9.4  8.8  8.9   10/9 - labs WNL, no changes  8. HTN: Monitor BP TID, well controlled    01/07/2022    4:37 AM 01/06/2022    8:03 PM 01/06/2022    3:28 PM  Vitals with BMI  Systolic [phone]   Diastolic 68 66 60  Pulse 74 67 65  - 10/4 IVF overnight for asymptomatic hypotension 70/40; improved this AM, DC maintenance fluids.   - 10/8 well controlled, continue to monitor  9. CAD: Monitor for symptoms with increase in activity. Continue ASA, Lipitor. Denies chest pain 10. 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  --Stable on Breztri PTA and husband provided from home.  - Provided packet on tobacco cessation education by nursing 10/10; pt will pursue medical assistance for quitting at discharge  11. Chronic tremors: Managed with inderal and Lamictal--worse in am and gets better as day progresses.  12.  Perforated gastric ulcer s/p repair 07/2021: On PPI 13. OSA: Nocturnal oxygen in the  past. Will order prn.          - 10/3 pt refusing CPAP as no longer needs OP s/p weight loss. DC  14. Dysphagia/Cough: on D2 nectar thick liquids. Educated patient and husband on aspiration risk and need for nectar liquids.              --flutter valve added.             --check CXR - 9/29 no acute cardiopulmonary process  - Upgraded to DYS 3 diet 9/29             - 10/10 Barium swallow; no aspiration but some deep penetration; cleared for D3 thin liquids with 5 cc Provalve cup - doing well  15. Blurred vision - added PRN eye drops. - improved              -  Encouraged work with therapies - OT notified              - Can consider OP neuroptho eval if persistent.    16. Urinary urgency              - Start ditropan 2.5 mg TID 10/4 - much improved              - PVR Qshift for 2 days to ensure no retention with new medication - 1x scan >200; continue to monitor               - 10/9 no further high scans; continue current regimen   17. Rhinorrhea - improved -Will start flonase 10/8  LOS: 13 days A FACE TO [place] 01/07/2022, 12:39 PM

## 2022-01-07 NOTE — Progress Notes (Signed)
Speech Language Pathology Daily Session Note  Patient Details  Name: Lauren [name] MRN: [id number] Date of Birth: 1950/11/09  Today's Date: 01/07/2022 SLP Individual Time: 0725-0807 SLP Individual Time Calculation (min): 42 min  Short Term Goals: Week 2: SLP Short Term Goal 1 (Week 2): STGs=LTGs due to ELOS  Skilled Therapeutic Interventions: Skilled treatment session focused on dysphagia and cognitive goals.  Upon arrival, patient was awake in bed and requested to use the bathroom. Patient independently retrieved her RW and ambulated to the bathroom with CGA. Patient was continent of bladder. SLP facilitated session by providing skilled observation with breakfast meal of Dys. 3 textures with thin liquids via the Provale cup.  Patient consumed meal with Min verbal cues needed for use of small bites and intermittent supervision level verbal cues to self-monitor and correct left buccal pocketing/anterior spillage. One overt coughing episode noted, suspect due to taking a sip with a large amount of solid foot in her oral cavity. Recommend patient continue current diet. SLP also provided skilled observation with thin liquids via straw. Patient was impulsive with intake requiring Mod verbal cues for a slow rate and to manage bolus size resulting in consistent throat clearing which is consistent with the repeat MBS from 01/06/22. Recommend ongoing trials of thin via straw with SLP only. Patient left upright in wheelchair with alarm on and all needs within reach. Continue with current plan of care.      Pain No/Denies Pain  Therapy/Group: Individual Therapy  Lauren [name] 01/07/2022, 1:21 PM

## 2022-01-07 NOTE — Progress Notes (Signed)
Occupational Therapy Session Note  Patient Details  Name: Lauren [name] MRN: [id number] Date of Birth: [birth year]  Today's Date: 01/07/2022 OT Individual Time: 0950-1050 OT Individual Time Calculation (min): 60 min    Short Term Goals:  Week 2:  OT Short Term Goal 1 (Week 2): LTG=STG 2/2 ELOS  Skilled Therapeutic Interventions/Progress Updates:    Pt received sitting with no c/o pain, agreeable to OT session. Pt taking a sip of her coffee without provale cup- provided education on need to use. Pt poured coffee into the provale with min stabilization. Pt completed functional mobility to the therapy gym, 150 ft, with the RW with CGA, frequently demonstrating incoordination with the RW management. She completed a dynamic balance activity with selective attention piece- gathering items into a laundry basket that she carried with BUE. No LOB- even reaching down to the floor with close guarding. 2 trials completed. Pt able to selectively attend to activity with min cueing. She then completed activity focused on dual processing- working memory task with dynamic balance. She was asked to remember simple block model and then built this model after navigating over two dynamic stepping tasks. She required mod cueing for attention and error recognition and CGA for balance. She completed several trials with similar cueing required. She required termination cues as well. She returned to her room and was left sitting up in the w/c with all needs met, husband present.    Therapy Documentation Precautions:  Precautions Precautions: Fall Precaution Comments: watch O2 Restrictions Weight Bearing Restrictions: No   Therapy/Group: Individual Therapy  Curtis Sites 01/07/2022, 6:55 AM

## 2022-01-07 NOTE — Progress Notes (Signed)
Physical Therapy Session Note  Patient Details  Name: Lauren [name] MRN: [id number] Date of Birth: Jan 20, 1951  Today's Date: 01/07/2022 PT Individual Time: 4888-9169 PT Individual Time Calculation (min): 41 min   Short Term Goals: Week 2:  PT Short Term Goal 1 (Week 2): STGs = LTGs  Skilled Therapeutic Interventions/Progress Updates:     Pt received seated in [facility] and agrees to therapy. No complaint of pain but does report having a rough morning due to having difficulty concentrating. WC transport to gym for time management. Gait training initiated with RW to prep pt for discharge, as PT recommends that pt use a RW with family at this time. Pt performs sit to stand with RW and cues for initiation. Pt ambulates x300' with RW, with primarily close supervision and several instances of CGA due to pt becoming distracted and having decreased safety with RW and increased postural sway to the L. Pt becomes very easily distracted by external stimuli, and on several occasions wanders in an obscure direction rather than remaining on course that PT suggests. Pt does not appear to have clear idea of what direction she is walking unless given consistent cues. Cues also provided to increase L stride length and to ensure that hips are in the middle of RW as pt tends to drift to the L with hips and also allows RW to travel too far ahead of her body. Following seated rest break, PT provides pt with task of "walking 2 laps around RN station." Pt ambulates with RW and quickly loses track of path, ambulating down hallway until she arrives at stairwell with emergency exit. Pt asks if she should push door to get out and PT provides cues to redirect pt and guide her back to dayroom with WC. Pt ambulates back to dayroom with cues for navigation as well as safe RW management for transfer back to WC. WC transport back to room. Left seated with all needs within reach.  Therapy Documentation Precautions:   Precautions Precautions: Fall Precaution Comments: watch O2 Restrictions Weight Bearing Restrictions: No   Therapy/Group: Individual Therapy  Breck Coons, PT, DPT 01/07/2022, 4:58 PM

## 2022-01-08 DIAGNOSIS — R1310 Dysphagia, unspecified: Secondary | ICD-10-CM

## 2022-01-08 MED ORDER — FLUTICASONE PROPIONATE 50 MCG/ACT NA SUSP: 1.0000 | Freq: Every day | NASAL | Status: DC | PRN

## 2022-01-08 MED ORDER — OXYBUTYNIN CHLORIDE 5 MG PO TABS
5.0000 mg | ORAL_TABLET | Freq: Three times a day (TID) | ORAL | Status: DC
  Administered 2022-01-08 – 2022-01-09 (×3): 5 mg via ORAL
  Filled 2022-01-08 (×5): qty 1

## 2022-01-08 NOTE — Progress Notes (Signed)
Physical Therapy Discharge Summary  Patient Details  Name: Lauren [name] MRN: [id number] Date of Birth: 11-18-1950  Date of Discharge from PT service:January 08, 2022  Today's Date: 01/08/2022 PT Individual Time: 1120-1200 PT Individual Time Calculation (min): 40 min    Patient has met 8 of 9 long term goals due to improved activity tolerance, improved balance, improved postural control, increased strength, improved attention, improved awareness, and improved coordination.  Patient to discharge at an ambulatory level Supervision.   Patient's care partner is independent to provide the necessary physical and cognitive assistance at discharge.  Reasons goals not met: Pt requires minA for stair navigation secondary to fatigue and slight LOB. Pt's husband educated on guarding for safe stair navigation.   Recommendation:  Patient will benefit from ongoing skilled PT services in outpatient setting to continue to advance safe functional mobility, address ongoing impairments in L hemibody NMR, balance, ambulation, and minimize fall risk.  Equipment: No equipment provided  Reasons for discharge: treatment goals met and discharge from hospital  Patient/family agrees with progress made and goals achieved: Yes  Skilled therapeutic Interventions: Pt received seated in [facility] and agrees to therapy. No complaint of pain. WC transport to gym for time management. Pt completes car transfer and ramp navigation with cues for sequencing. Pt performs sit to stand with cues for initiation. Pt ambulates x200' without AD, with cues to increase L stride length and step height, as well as cueing to stop and "regroup" when pt appears to be becoming distracted and has deteriorating gait pattern. Following rest break, pt performs sit<>supine with cues for sequencing. Pt performs DGI, as detailed below. Pt then completes x12 6" steps with L hand rail and minA, with frequent cueing for step sequencing, foot positioning, and  ensuring use of L handrail as pt becomes distracted and continually attempts to use R hand rail. Pt left seated in WC at [employer] group in [place].   PT Discharge Precautions/Restrictions Precautions Precautions: Fall Restrictions Weight Bearing Restrictions: No Pain Interference Pain Interference Pain Effect on Sleep: 1. Rarely or not at all Pain Interference with Therapy Activities: 1. Rarely or not at all Pain Interference with Day-to-Day Activities: 1. Rarely or not at all Vision/Perception  Vision - History Ability to See in Adequate Light: 0 Adequate Perception Perception: Impaired Inattention/Neglect: Does not attend to left side of body (improved since eval) Praxis Praxis: Impaired Praxis Impairment Details: Ideation;Initiation Praxis-Other Comments: praxis improved since eval  Cognition Overall Cognitive Status: Impaired/Different from baseline Arousal/Alertness: Awake/alert Orientation Level: Oriented X4 Selective Attention: Impaired Memory: Impaired Safety/Judgment: Impaired Sensation Sensation Light Touch: Impaired Detail Light Touch Impaired Details: Impaired LUE;Impaired LLE Coordination Gross Motor Movements are Fluid and Coordinated: No Fine Motor Movements are Fluid and Coordinated: No Coordination and Movement Description: Decrease dsmoothness and accuracy with L hand- pt is L handed Heel Shin Test: Decreased coordination on L Motor  Motor Motor: Hemiplegia Motor - Discharge Observations: Improved from eval  Mobility Bed Mobility Bed Mobility: Supine to Sit;Sit to Supine Supine to Sit: Supervision/Verbal cueing Sit to Supine: Supervision/Verbal cueing Transfers Transfers: Stand to Sit;Sit to Stand;Stand Pivot Transfers Sit to Stand: Supervision/Verbal cueing Stand to Sit: Supervision/Verbal cueing Stand Pivot Transfers: Supervision/Verbal cueing Stand Pivot Transfer Details: Verbal cues for sequencing;Verbal cues for technique Transfer  (Assistive device): None Locomotion  Gait Ambulation: Yes Gait Assistance: Supervision/Verbal cueing Gait Distance (Feet): 200 Feet Assistive device: None Gait Assistance Details: Verbal cues for technique;Verbal cues for precautions/safety;Verbal cues for gait pattern Gait Gait: Yes  Gait Pattern: Impaired Gait Pattern: Ataxic (Slightly ataxic on L side but much improved from eval) Stairs / Additional Locomotion Stairs: Yes Stairs Assistance: Minimal Assistance - Patient > 75% Stair Management Technique: Two rails Number of Stairs: 12 Height of Stairs: 6 Ramp: Supervision/Verbal cueing Curb: Supervision/Verbal cueing Wheelchair Mobility Wheelchair Mobility: No  Trunk/Postural Assessment  Cervical Assessment Cervical Assessment: Exceptions to [facility] Thoracic Assessment Thoracic Assessment: Exceptions to [facility] Lumbar Assessment Lumbar Assessment: Exceptions to [facility] Postural Control Postural Control: Deficits on evaluation Righting Reactions: delayed and inadequate, improved from eval  Balance Standardized Balance Assessment Standardized Balance Assessment: Dynamic Gait Index Dynamic Gait Index Level Surface: Mild Impairment Change in Gait Speed: Mild Impairment Gait with Horizontal Head Turns: Moderate Impairment Gait with Vertical Head Turns: Moderate Impairment Gait and Pivot Turn: Normal Step Over Obstacle: Severe Impairment Step Around Obstacles: Mild Impairment Steps: Severe Impairment Total Score: 11 Static Sitting Balance Static Sitting - Balance Support: Feet supported Static Sitting - Level of Assistance: 5: Stand by assistance Dynamic Sitting Balance Dynamic Sitting - Balance Support: Feet supported Dynamic Sitting - Level of Assistance: 5: Stand by assistance Static Standing Balance Static Standing - Balance Support: During functional activity Static Standing - Level of Assistance: 5: Stand by assistance Dynamic Standing Balance Dynamic Standing - Balance  Support: During functional activity Dynamic Standing - Level of Assistance: 5: Stand by assistance Extremity Assessment  RLE Assessment RLE Assessment: Exceptions to [facility] General Strength Comments: Hip flexion 4/5, otherwise grossly WFL LLE Assessment LLE Assessment: Exceptions to [facility] General Strength Comments: Hip Flexion 3/5, Otherwise grossly 4/5   Breck Coons, PT, DPT 01/08/2022, 1:02 PM

## 2022-01-08 NOTE — Progress Notes (Signed)
PROGRESS NOTE   Subjective/Complaints:  Patient seen and evaluated at bedside. Doing well, no complaints.  Did discuss 4x urination overnight, patient agreeable to trialing increase in Ditropan. No s/e noted on current dose.   ROS: Denies fevers, HA, hills, N/V, abdominal pain, constipation, diarrhea, SOB, cough, chest pain, new weakness or paraesthesias.    Objective:   No results found. No results for input(s): "WBC", "HGB", "HCT", "PLT" in the last 72 hours.  No results for input(s): "NA", "K", "CL", "CO2", "GLUCOSE", "BUN", "CREATININE", "CALCIUM" in the last 72 hours.   Intake/Output Summary (Last 24 hours) at 01/08/2022 1234 Last data filed at 01/07/2022 1857 Gross per 24 hour  Intake 719 ml  Output --  Net 719 ml         Physical Exam: Vital Signs Blood pressure 115/68, pulse 66, temperature 98.5 F (36.9 C), temperature source Oral, resp. rate 18, height 5' (1.524 m), weight 42.7 kg, SpO2 94 %. Constitutional: No apparent distress. Appropriate appearance for age.  HENT: No JVD. Neck Supple. Trachea midline. Atraumatic, normocephalic. +glasses Eyes: PERRLA. EOMI. Visual fields grossly intact.  Cardiovascular: RRR, no murmurs/rub/gallops. No Edema. Peripheral pulses 2+  Respiratory: CTAB. No rales, rhonchi, or wheezing. On RA. Good air movement.   Abdomen: + bowel sounds, normoactive. No distention or tenderness.  Skin: Mild DTI over sacrum, improved.  Neuro: AAO to person, place, time and event. Mild left inattention - resolved; L facial droop - mild. Impulsive, needs reminders to slow down/focus on current task.  MSK: antigravity and against resistance in all 4 extremities , 5/5 b/l UE.     Assessment/Plan: 1. Functional deficits which require 3+ hours per day of interdisciplinary therapy in a comprehensive inpatient rehab setting. Physiatrist is providing close team supervision and 24 hour management of  active medical problems listed below. Physiatrist and rehab team continue to assess barriers to discharge/monitor patient progress toward functional and medical goals  Care Tool:  Bathing    Body parts bathed by patient: Right arm, Left arm, Chest, Abdomen, Front perineal area, Buttocks, Right upper leg, Left upper leg, Face   Body parts bathed by helper: Right lower leg, Left lower leg     Bathing assist Assist Level: Minimal Assistance - Patient > 75%     Upper Body Dressing/Undressing Upper body dressing   What is the patient wearing?: Pull over shirt    Upper body assist Assist Level: Minimal Assistance - Patient > 75%    Lower Body Dressing/Undressing Lower body dressing      What is the patient wearing?: Underwear/pull up, Pants     Lower body assist Assist for lower body dressing: Minimal Assistance - Patient > 75%     Toileting Toileting    Toileting assist Assist for toileting: Contact Guard/Touching assist     Transfers Chair/bed transfer  Transfers assist     Chair/bed transfer assist level: Minimal Assistance - Patient > 75%     Locomotion Ambulation   Ambulation assist      Assist level: Moderate Assistance - Patient 50 - 74% Assistive device: No Device Max distance: 150'   Walk 10 feet activity   Assist  Assist level: Moderate Assistance - Patient - 50 - 74% Assistive device: No Device   Walk 50 feet activity   Assist    Assist level: Moderate Assistance - Patient - 50 - 74% Assistive device: No Device    Walk 150 feet activity   Assist    Assist level: Moderate Assistance - Patient - 50 - 74% Assistive device: No Device    Walk 10 feet on uneven surface  activity   Assist     Assist level: Moderate Assistance - Patient - 50 - 74% Assistive device:  (R handrail)   Wheelchair     Assist Is the patient using a wheelchair?: Yes Type of Wheelchair: Manual    Wheelchair assist level: Total Assistance -  Patient < 25% Max wheelchair distance: 150'    Wheelchair 50 feet with 2 turns activity    Assist        Assist Level: Total Assistance - Patient < 25%   Wheelchair 150 feet activity     Assist      Assist Level: Total Assistance - Patient < 25%   Blood pressure 115/68, pulse 66, temperature 98.5 F (36.9 C), temperature source Oral, resp. rate 18, height 5' (1.524 m), weight 42.7 kg, SpO2 94 %.  Medical Problem List and Plan: 1. Functional deficits secondary to R MCA stroke with L sensory deficits and L hemiparesis as well as dysphagia and L inattention             -patient may  shower             -ELOS/Goals: 10-14 days- supervision with PT, OT, SLP  -Continue CIR with PT/OT/SLP  2.  Antithrombotics: -DVT/anticoagulation:  Pharmaceutical: Lovenox             -antiplatelet therapy: ASA. Per DC, "She has been started on only aspirin 81 mg p.o. daily instead of DAPT due to previous history of large ICH/hemorrhagic stroke." Can address with Neuro at OP f/u.   3. DDD Cervical spine/Pain Management: tylenol prn.   4. Mood/Behavior/Sleep:  LCSW to follow for evaluation and support.              - Hx  ADHD: On Adderall daily.              - Hx Bipolar disorder: Stable on home regimen of Lamictal, Seroquel and buspar for anxiety  - ?mania component w/ impulsivity; per family somewhat baseline but behaviors have worsened s/p strokes              -antipsychotic agents:  Continue              - neuropsych consult next week if appropriate - performed 10/4, no new recs            - 9/30: Fall OOB, noted impulsive behaviors and poor safety awareness, however no further tele available in hospital; nursing to place belt alarm in bed             - 10/4 DC scheduled atarax d/t dry mouth, not home med. PRN available and not used.    5. Neuropsych/cognition: This patient is capable of making decisions on her own behalf. 6. Skin/Wound Care: Routine pressure relief measures.            - Sacral DTI - Sacral heart, change PRN  7. Fluids/Electrolytes/Nutrition: Monitor I/O. Check CMET in am.    Latest Ref Rng & Units 01/05/2022    6:39 AM 12/29/2021  6:55 AM 12/26/2021    6:31 AM  BMP  Glucose 70 - 99 mg/dL 87  87  86   BUN 8 - 23 mg/dL 17  13  13    Creatinine 0.44 - 1.00 mg/dL 0.52  0.62  0.61   Sodium 135 - 145 mmol/L 143  140  140   Potassium 3.5 - 5.1 mmol/L 4.3  3.8  4.2   Chloride 98 - 111 mmol/L 108  105  105   CO2 22 - 32 mmol/L 26  28  30    Calcium 8.9 - 10.3 mg/dL 9.4  8.8  8.9   10/9 - labs WNL, no changes  8. HTN: Monitor BP TID, well controlled    01/07/2022    7:33 PM 01/07/2022    3:09 PM 01/07/2022    4:37 AM  Vitals with BMI  Systolic [phone]  Diastolic 68 61 68  Pulse 66 74 74  - 10/4 IVF overnight for asymptomatic hypotension 70/40; improved this AM, DC maintenance fluids.   - 10/8 well controlled, continue to monitor  9. CAD: Monitor for symptoms with increase in activity. Continue ASA, Lipitor. Denies chest pain 10. 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  --Stable on Breztri PTA and husband provided from home.  - Provided packet on tobacco cessation education by nursing 10/10; pt will pursue medical assistance for quitting at discharge  11. Chronic tremors: Managed with inderal and Lamictal--worse in am and gets better as day progresses.  12.  Perforated gastric ulcer s/p repair 07/2021: On PPI 13. OSA: Nocturnal oxygen in the  past. Will order prn.          - 10/3 pt refusing CPAP as no longer needs OP s/p weight loss. DC  14. Dysphagia/Cough: on D2 nectar thick liquids. Educated patient and husband on aspiration risk and need for nectar liquids.              --flutter valve added.             --check CXR - 9/29 no acute cardiopulmonary process  - Upgraded to DYS 3 diet 9/29             - 10/10 Barium swallow; no aspiration but some deep penetration; cleared for D3 thin liquids  with 5 cc Provale cup - doing well  15. Blurred vision - added PRN eye drops. - resolved              - Encouraged work with therapies - OT notified 16. Urinary urgency              - Start ditropan 2.5 mg TID 10/4 - much improved              - PVR Qshift for 2 days to ensure no retention with new medication - 1x scan >200; continue to monitor               - 10/9 no further high scans; continue current regimen                - 10/12 - increase ditropan to 5 mg TID; Q8H bladder scans   17. Rhinorrhea - improved -Will start flonase 10/8 -> PRN 10/12  LOS: 14 days A FACE TO [place] 01/08/2022, 12:34 PM

## 2022-01-08 NOTE — Group Note (Signed)
Patient Details Name: Lauren [name] MRN: [id number] DOB: 08/05/1950 Today's Date: 01/08/2022  Time Calculation:  1200-1230  Group Description: [employer]: Patient participated in [employer] with both OT and SLP with focus on self-feeding, dysphagia goals, cognitive goals, and appropriate social interaction within a group environment.    Individual level documentation: Patient participated in [employer] and required min assist for functional use of left UE during self-feeding.   Patient participated in [employer] with focus on dysphagia goals. Patient consumed their lunch meal of dysphasia 3 textures with thin liquids via the Provale cup. Patient consumed meal without overt s/s of aspiration and required min assist for use of swallowing compensatory strategies. Recommend patient continue current diet.   Patient participated in [employer] with focus on cognitive goals. Patient required supervision assist for selective attention in a mildly distracting environment. Patient also required supervision assist for visual scanning to left field of environment. Patient also demonstrated appropriate social interaction within a group environment with overall supervision assist.   Pain:  No/Denies Pain    [name] 01/08/2022, 3:04 PM

## 2022-01-08 NOTE — Group Note (Signed)
Patient Details Name: Tajuanna Burnett MRN: [id number] DOB: [birth year] Today's Date: 01/08/2022  Time Calculation: OT Group Time Calculation OT Group Start Time: 8159 OT Group Stop Time: 4707 OT Group Time Calculation (min): 15 min      Group Description: [employer]: Patient participated in [employer] with both OT and SLP with focus on self-feeding, dysphagia goals, cognitive goals, and appropriate social interaction within a group environment.   Individual level documentation: Patient participated in [employer] and required min assist for functional use of bilateral UEs during self-feeding.   Patient participated in [employer] with focus on dysphagia goals. Patient consumed their lunch meal of regular textures and thin liquids with provalve cup. Patient consumed meal without overt s/s of aspiration and required supervision assist for use of swallowing compensatory strategies. Recommend patient continue current diet.   Patient participated in [employer] with focus on cognitive goals. Patient required (drop down selection supervision assist for selective attention in a mildly distracting environment.  Patient also demonstrated appropriate social interaction within a group environment with overall supervision assist.   Pain: no indication of pain in session         Nicoletta Ba 01/08/2022, 1:24 PM

## 2022-01-08 NOTE — Progress Notes (Signed)
Occupational Therapy Discharge Summary  Patient Details  Name: Lauren [name] MRN: [id number] Date of Birth: [birth year]  Date of Discharge from [place] [facility], 2023  Today's Date: 01/08/2022 OT Individual Time: 6720-9470 OT Individual Time Calculation (min): 45 min   Pt greeted seated in wc and agreeable to OT treatment session. Nursing administered meds and vitals taken. Pt then ambulated to therapy gym with RW and close supervision, but CGA for balance when turning to enter the gym. Worked on weight shifting and maintaining midline using 1 inch stepping placed under L leg. Incorporated trunk rotation to the L and then weight shift R using clothes pins and basketball net activity. L FMC with card sorting task. Pt needed cues to maintain attention to L hand and make sure she was actually releasing card when sorted. Pt often dropping cards and needed increased time and effort to complete task with L hand. Functional ambulation back to room with RW in similar fashion. Pt left seated In wc with alarm belt on, call bell in reach, and needs met.    Patient has met 15 of 15 long term goals due to improved activity tolerance, improved balance, postural control, ability to compensate for deficits, functional use of  LEFT upper and LEFT lower extremity, improved attention, improved awareness, and improved coordination.  Patient to discharge at overall Supervision/CGA level.  Patient's care partner is independent to provide the necessary physical and cognitive assistance at discharge.    Reasons goals not met: n/a  Recommendation:  Patient will benefit from ongoing skilled OT services in outpatient setting to continue to advance functional skills in the area of BADL, iADL, Reduce care partner burden, and functional use of L UE, L attention, cognitive retraining .  Equipment: No equipment provided  Reasons for discharge: treatment goals met and discharge from hospital  Patient/family agrees  with progress made and goals achieved: Yes  OT Discharge Precautions/Restrictions  Precautions Precautions: Fall Restrictions Weight Bearing Restrictions: No Pain  Denies pain ADL ADL Eating: Supervision/safety Grooming: Supervision/safety Upper Body Bathing: Supervision/safety Lower Body Bathing: Supervision/safety Upper Body Dressing: Supervision/safety Lower Body Dressing: Supervision/safety Toileting: Contact guard Toilet Transfer: Close supervision Tub/Shower Transfer: Close supervison Perception  Perception: Impaired Inattention/Neglect: Does not attend to left side of body (improved since eval) Praxis Praxis: Impaired Praxis Impairment Details: Ideation;Initiation Praxis-Other Comments: praxis improved since eval Cognition Cognition Overall Cognitive Status: Impaired/Different from baseline Arousal/Alertness: Awake/alert Memory: Impaired Attention: Selective;Sustained Awareness: Impaired Safety/Judgment: Impaired Brief Interview for Mental Status (BIMS) Repetition of Three Words (First Attempt): 3 Temporal Orientation: Year: Correct Temporal Orientation: Month: Accurate within 5 days Temporal Orientation: Day: Correct Recall: "Sock": Yes, no cue required Recall: "Blue": Yes, no cue required Recall: "Bed": Yes, no cue required BIMS Summary Score: 15 Sensation Sensation Light Touch: Impaired Detail Light Touch Impaired Details: Impaired LUE;Impaired LLE Coordination Gross Motor Movements are Fluid and Coordinated: No Fine Motor Movements are Fluid and Coordinated: No Coordination and Movement Description: Decreased smoothness and accuracy with L hand but improved since eval Heel Shin Test: Decreased coordination on L Motor  Motor Motor: Hemiplegia Motor - Discharge Observations: Improved from eval Mobility  Bed Mobility Bed Mobility: Supine to Sit;Sit to Supine Supine to Sit: Supervision/Verbal cueing Sit to Supine: Supervision/Verbal  cueing Transfers Sit to Stand: Supervision/Verbal cueing Stand to Sit: Supervision/Verbal cueing  Trunk/Postural Assessment  Cervical Assessment Cervical Assessment: Exceptions to [facility] Thoracic Assessment Thoracic Assessment: Exceptions to [facility] Lumbar Assessment Lumbar Assessment: Exceptions to [facility] Postural Control Postural Control: Deficits on evaluation  Righting Reactions: delayed and inadequate, improved from eval  Balance Static Sitting Balance Static Sitting - Balance Support: Feet supported Static Sitting - Level of Assistance: 6: Modified independent (Device/Increase time) Dynamic Sitting Balance Dynamic Sitting - Balance Support: Feet supported Dynamic Sitting - Level of Assistance: 6: Modified independent (Device/Increase time) Static Standing Balance Static Standing - Balance Support: During functional activity Static Standing - Level of Assistance: 5: Stand by assistance Dynamic Standing Balance Dynamic Standing - Balance Support: During functional activity Dynamic Standing - Level of Assistance: 5: Stand by assistance Extremity/Trunk Assessment RUE Assessment RUE Assessment: Within Functional Limits LUE Assessment LUE Assessment: Exceptions to [facility] Strength Comments: Grossly 4/5- strength improved since eval LUE Body System: Neuro Brunstrum levels for arm and hand: Arm;Hand Brunstrum level for arm: Stage V Relative Independence from [employer] Brunstrum level for hand: Stage VI Isolated joint movements   Daneen Schick Eduarda Scrivens 01/08/2022, 3:33 PM

## 2022-01-08 NOTE — Progress Notes (Signed)
Occupational Therapy Session Note  Patient Details  Name: Lauren [name] MRN: [id number] Date of Birth: 08/24/1950  Today's Date: 01/08/2022 OT Individual Time: 7044-9252 OT Individual Time Calculation (min): 75 min    Short Term Goals: Week 2:  OT Short Term Goal 1 (Week 2): LTG=STG 2/2 ELOS  Skilled Therapeutic Interventions/Progress Updates:    Pt greeted seated in wc and agreeable to OT treatment session focused on increased independence with self-care tasks. Pt ambulated to bathroom w/ RW and close supervision. Pt completed toilet transfer and toileting tasks with supervision but verbal cues for awareness of L hand and safety. Bathing completed from tub bench in shower with improved initiation with L hand to wash body parts. Overall close supervision. Dressing tasks from wc with supervision and min cues for technique and safety awareness. Addressed standing balance and grooming tasks at the sink. Pt needed assistance getting fixident out of dentures to clean them. Discussed readiness for dc with spouse. Reviewed home set-up, DME, tub bench transfer, and safety within home environment. Pt brought down to therapy gym and completed 9-hold PEG test as described below. R:43 L: 95  Pt reported when she is walking, she feels like she is fighting the walker. OT issued a different RW to see if this made a difference. Pt reported feeling improved control with new RW. Pt returned to room and left seated in wc with alarm belt on, call bell in reach, and needs met.   Therapy Documentation Precautions:  Precautions Precautions: Fall Precaution Comments: watch O2 Restrictions Weight Bearing Restrictions: No Pain: Pain Assessment Pain Scale: 0-10 Pain Score: 0-No pain    Therapy/Group: Individual Therapy  Valma Cava 01/08/2022, 9:32 AM

## 2022-01-08 NOTE — Progress Notes (Signed)
Patient ID: Lauren [name], female   DOB: 11-07-1950, 71 y.o.   MRN: [id number]  SW faxed outpatient PT/OT/SLP referral to [facility] location ([phone]/[phone]). *SW received return phone call from [facility] location ([phone]/[phone]) confirming referral received.   SW called pt husband to inform on above, and share pt will not require any DME at discharge.   Loralee Pacas, MSW, [place] Office: [phone] Cell: [phone] Fax: [phone]

## 2022-01-08 NOTE — Group Note (Unsigned)
Patient Details Name: Julionna Marczak MRN: [id number] DOB: 10/13/1950 Today's Date: 01/08/2022  Time Calculation:        Group Description: Diner's Club: Patient participated in [employer] with both OT and SLP with focus on self-feeding, dysphagia goals, cognitive goals, and appropriate social interaction within a group environment.    Individual level documentation: Patient participated in diner's club and required {assistance:28279} assist for functional use of {RIGHT/LEFT:20294} UE during self-feeding.   Patient participated in diner's club with focus on dysphagia goals. Patient consumed their lunch meal of {diet with liquids:28280}. Patient consumed meal {consistent/inconsistent:28281} overt s/s of aspiration and required {assistance:28279} assist for use of swallowing compensatory strategies. Recommend patient continue current diet.   Patient participated in diner's club with focus on cognitive goals. Patient required (drop down selection {assistance:28279} assist for selective attention in a mildly distracting environment. Patient also required {assistance:28279} assist for visual scanning to {RIGHT/LEFT:20294} field of environment. Patient also demonstrated appropriate social interaction within a group environment with overall {assistance:28279} assist.   Pain:    Precautions:     Willeen Cass [facility] 01/08/2022, 1:16 PM

## 2022-01-08 NOTE — Plan of Care (Signed)
  Problem: RH Balance Goal: LTG: Patient will maintain dynamic sitting balance (OT) Description: LTG:  Patient will maintain dynamic sitting balance with assistance during activities of daily living (OT) Outcome: Completed/Met Goal: LTG Patient will maintain dynamic standing with ADLs (OT) Description: LTG:  Patient will maintain dynamic standing balance with assist during activities of daily living (OT)  Outcome: Completed/Met   Problem: Sit to Stand Goal: LTG:  Patient will perform sit to stand in prep for activites of daily living with assistance level (OT) Description: LTG:  Patient will perform sit to stand in prep for activites of daily living with assistance level (OT) Outcome: Completed/Met   Problem: RH Eating Goal: LTG Patient will perform eating w/assist, cues/equip (OT) Description: LTG: Patient will perform eating with assist, with/without cues using equipment (OT) Outcome: Completed/Met   Problem: RH Grooming Goal: LTG Patient will perform grooming w/assist,cues/equip (OT) Description: LTG: Patient will perform grooming with assist, with/without cues using equipment (OT) Outcome: Completed/Met   Problem: RH Bathing Goal: LTG Patient will bathe all body parts with assist levels (OT) Description: LTG: Patient will bathe all body parts with assist levels (OT) Outcome: Completed/Met   Problem: RH Dressing Goal: LTG Patient will perform upper body dressing (OT) Description: LTG Patient will perform upper body dressing with assist, with/without cues (OT). Outcome: Completed/Met Goal: LTG Patient will perform lower body dressing w/assist (OT) Description: LTG: Patient will perform lower body dressing with assist, with/without cues in positioning using equipment (OT) Outcome: Completed/Met   Problem: RH Toileting Goal: LTG Patient will perform toileting task (3/3 steps) with assistance level (OT) Description: LTG: Patient will perform toileting task (3/3 steps) with  assistance level (OT)  Outcome: Completed/Met   Problem: RH Vision Goal: RH LTG Vision (Specify) Outcome: Completed/Met   Problem: RH Functional Use of Upper Extremity Goal: LTG Patient will use RT/LT upper extremity as a (OT) Description: LTG: Patient will use right/left upper extremity as a stabilizer/gross assist/diminished/nondominant/dominant level with assist, with/without cues during functional activity (OT) Outcome: Completed/Met   Problem: RH Toilet Transfers Goal: LTG Patient will perform toilet transfers w/assist (OT) Description: LTG: Patient will perform toilet transfers with assist, with/without cues using equipment (OT) Outcome: Completed/Met   Problem: RH Tub/Shower Transfers Goal: LTG Patient will perform tub/shower transfers w/assist (OT) Description: LTG: Patient will perform tub/shower transfers with assist, with/without cues using equipment (OT) Outcome: Completed/Met   Problem: RH Attention Goal: LTG Patient will demonstrate this level of attention during functional activites (OT) Description: LTG:  Patient will demonstrate this level of attention during functional activites  (OT) Outcome: Completed/Met   Problem: RH Awareness Goal: LTG: Patient will demonstrate awareness during functional activites type of (OT) Description: LTG: Patient will demonstrate awareness during functional activites type of (OT) Outcome: Completed/Met

## 2022-01-09 ENCOUNTER — Other Ambulatory Visit (HOSPITAL_COMMUNITY): Payer: Self-pay

## 2022-01-09 MED ORDER — BREZTRI AEROSPHERE 160-9-4.8 MCG/ACT IN AERO: 2.0000 | INHALATION_SPRAY | Freq: Two times a day (BID) | RESPIRATORY_TRACT | Status: AC

## 2022-01-09 MED ORDER — OXYBUTYNIN CHLORIDE 5 MG PO TABS
5.0000 mg | ORAL_TABLET | Freq: Three times a day (TID) | ORAL | 0 refills | Status: DC
  Filled 2022-01-09: qty 90, 30d supply, fill #0

## 2022-01-09 MED ORDER — ASPIRIN 81 MG PO TBEC
81.0000 mg | DELAYED_RELEASE_TABLET | Freq: Every day | ORAL | 0 refills | Status: DC
  Filled 2022-01-09: qty 6, 6d supply, fill #0

## 2022-01-09 MED ORDER — BUSPIRONE HCL 15 MG PO TABS
30.0000 mg | ORAL_TABLET | Freq: Three times a day (TID) | ORAL | 0 refills | Status: AC
  Filled 2022-01-09: qty 140, 24d supply, fill #0

## 2022-01-09 MED ORDER — CLOPIDOGREL BISULFATE 75 MG PO TABS
75.0000 mg | ORAL_TABLET | Freq: Every day | ORAL | 0 refills | Status: DC
  Filled 2022-01-09: qty 30, 30d supply, fill #0

## 2022-01-09 MED ORDER — SENNOSIDES-DOCUSATE SODIUM 8.6-50 MG PO TABS
2.0000 | ORAL_TABLET | Freq: Every evening | ORAL | 0 refills | Status: AC | PRN
  Filled 2022-01-09: qty 60, 30d supply, fill #0

## 2022-01-09 MED ORDER — ATORVASTATIN CALCIUM 40 MG PO TABS
40.0000 mg | ORAL_TABLET | Freq: Every day | ORAL | 0 refills | Status: AC
  Filled 2022-01-09: qty 30, 30d supply, fill #0

## 2022-01-09 MED ORDER — PANTOPRAZOLE SODIUM 40 MG PO TBEC
40.0000 mg | DELAYED_RELEASE_TABLET | Freq: Every day | ORAL | 0 refills | Status: AC
  Filled 2022-01-09: qty 30, 30d supply, fill #0

## 2022-01-09 MED ORDER — PROPRANOLOL HCL ER 60 MG PO CP24
60.0000 mg | ORAL_CAPSULE | Freq: Every day | ORAL | 0 refills | Status: AC
  Filled 2022-01-09: qty 30, 30d supply, fill #0

## 2022-01-09 MED ORDER — FLUTICASONE PROPIONATE 50 MCG/ACT NA SUSP
1.0000 | Freq: Every day | NASAL | 2 refills | Status: AC | PRN
  Filled 2022-01-09: qty 16, 30d supply, fill #0

## 2022-01-09 MED ORDER — CLOPIDOGREL BISULFATE 75 MG PO TABS
75.0000 mg | ORAL_TABLET | Freq: Every day | ORAL | 0 refills | Status: DC
  Filled 2022-01-09: qty 7, 7d supply, fill #0

## 2022-01-09 MED ORDER — LAMOTRIGINE 100 MG PO TABS
200.0000 mg | ORAL_TABLET | Freq: Every day | ORAL | 0 refills | Status: AC
  Filled 2022-01-09: qty 60, 30d supply, fill #0

## 2022-01-09 MED ORDER — ONE-DAILY MULTI VITAMINS PO TABS: 1.0000 | ORAL_TABLET | Freq: Every day | ORAL | Status: AC

## 2022-01-09 MED ORDER — NAPHAZOLINE-GLYCERIN 0.012-0.25 % OP SOLN: 1.0000 [drp] | Freq: Four times a day (QID) | OPHTHALMIC | Status: AC | PRN

## 2022-01-09 MED ORDER — ACETAMINOPHEN 325 MG PO TABS
325.0000 mg | ORAL_TABLET | ORAL | 0 refills | Status: AC | PRN
  Filled 2022-01-09: qty 30, 3d supply, fill #0

## 2022-01-09 MED ORDER — TRAZODONE HCL 50 MG PO TABS
50.0000 mg | ORAL_TABLET | Freq: Every day | ORAL | 0 refills | Status: AC
  Filled 2022-01-09: qty 30, 30d supply, fill #0

## 2022-01-09 NOTE — Progress Notes (Signed)
Speech Language Pathology Discharge Summary  Patient Details  Name: Lauren [name] MRN: [id number] Date of Birth: 1950-10-09  Date of Discharge from [place] 12, 2023  Patient has met 6 of 6 long term goals.  Patient to discharge at [facility] level.   Reasons goals not met: N/A  Clinical Impression/Discharge Summary: Patient has made functional gains and has met 6 of 6 LTGs this admission. Currently, patient requires overall Min A verbal cues to complete functional and familiar tasks safely in regards to recall of functional information, selective attention in a mildly distracting environment, functional problem solving and emergent awareness. Patient's overall safety awareness can be impacted at times by fatigue, verbosity, and impulsivity.  Patient is consuming Dys. 3 textures with thin liquids via the Provale cup due to decreased coordination of bolus size and timing of swallow trigger. Patient is consuming current diet with minimal overt s/s of aspiration with Min verbal cues for use of swallowing compensatory strategies, especially to clear left buccal pocketing and a slow rate of self-feeding. Patient demonstrates a mild oral-motor weakness resulting in imprecise consonants, however, patient is essentially 100% intelligible at the conversation level. Patient and family education is complete and patient will discharge home with 24 hour supervision from family. Patient would benefit from f/u outpatient SLP services to maximize her cognitive and swallowing function and reduce caregiver burden.   Care Partner:  Caregiver Able to Provide Assistance: Yes  Type of Caregiver Assistance: Physical;Cognitive  Recommendation:  24 hour supervision/assistance;Outpatient SLP  Rationale for SLP Follow Up: Reduce caregiver burden;Maximize cognitive function and independence;Maximize swallowing safety;Maximize functional communication   Equipment: Provale Cup   Reasons for discharge:  Discharged from hospital;Treatment goals met   Patient/Family Agrees with Progress Made and Goals Achieved: Yes    Clerance Umland, [place] 01/09/2022, 6:35 AM

## 2022-01-09 NOTE — Progress Notes (Signed)
Recreational Therapy Discharge Summary Patient Details  Name: Hibba Schram MRN: [id number] Date of Birth: [birth year] Today's Date: 01/09/2022  Session Note:  Pt seated EOB with husband present in preparation for discharge.  Pt excited and agreeable to participate in animal assisted activity seated bed level with supervision.  Pt reaching outside BOS, needing min cues for safety awareness.  Pt appreciative of this visit but is looking forward to returning home to her dog.  Comments on progress toward goals: Pt is discharging home today with husband to provide/coordinate 24 hour care.  TR sessions focused on leisure education, activity analysis/modifications, stress management and pet therapy.    Reasons for discharge: discharge from hospital  Follow-up: Outpatient  Patient/family agrees with progress made and goals achieved: Yes  Denya Buckingham 01/09/2022, 12:01 PM

## 2022-01-09 NOTE — Plan of Care (Signed)
  Problem: RH Swallowing Goal: LTG Patient will consume least restrictive diet using compensatory strategies with assistance (SLP) Description: LTG:  Patient will consume least restrictive diet using compensatory strategies with assistance (SLP) Outcome: Completed/Met Goal: LTG Pt will demonstrate functional change in swallow as evidenced by bedside/clinical objective assessment (SLP) Description: LTG: Patient will demonstrate functional change in swallow as evidenced by bedside/clinical objective assessment (SLP) Outcome: Completed/Met   Problem: RH Problem Solving Goal: LTG Patient will demonstrate problem solving for (SLP) Description: LTG:  Patient will demonstrate problem solving for basic/complex daily situations with cues  (SLP) Outcome: Completed/Met   Problem: RH Memory Goal: LTG Patient will use memory compensatory aids to (SLP) Description: LTG:  Patient will use memory compensatory aids to recall biographical/new, daily complex information with cues (SLP) Outcome: Completed/Met   Problem: RH Attention Goal: LTG Patient will demonstrate this level of attention during functional activites (SLP) Description: LTG:  Patient will will demonstrate this level of attention during functional activites (SLP) Outcome: Completed/Met   Problem: RH Awareness Goal: LTG: Patient will demonstrate awareness during functional activites type of (SLP) Description: LTG: Patient will demonstrate awareness during functional activites type of (SLP) Outcome: Completed/Met

## 2022-01-09 NOTE — Progress Notes (Signed)
PROGRESS NOTE   Subjective/Complaints:  Patient seen and evaluated at bedside. Doing well, no complaints. Tolerating increase in ditropan, slept uninterrupted throughout the night. 1x bladder scan ~90 ccs.    ROS: Denies fevers, HA, hills, N/V, abdominal pain, constipation, diarrhea, SOB, cough, chest pain, new weakness or paraesthesias.    Objective:   No results found. No results for input(s): "WBC", "HGB", "HCT", "PLT" in the last 72 hours.  No results for input(s): "NA", "K", "CL", "CO2", "GLUCOSE", "BUN", "CREATININE", "CALCIUM" in the last 72 hours.   Intake/Output Summary (Last 24 hours) at 01/09/2022 1057 Last data filed at 01/09/2022 0823 Gross per 24 hour  Intake 460 ml  Output --  Net 460 ml         Physical Exam: Vital Signs Blood pressure (!) 104/59, pulse 68, temperature 98 F (36.7 C), resp. rate 16, height 5' (1.524 m), weight 42.7 kg, SpO2 94 %. Constitutional: No apparent distress. Appropriate appearance for age.  HENT: No JVD. Neck Supple. Trachea midline. Atraumatic, normocephalic. +glasses Eyes: PERRLA. EOMI. Visual fields grossly intact.  Cardiovascular: RRR, no murmurs/rub/gallops. No Edema. Peripheral pulses 2+  Respiratory: CTAB. No rales, rhonchi, or wheezing. On RA. Good air movement.   Abdomen: + bowel sounds, normoactive. No distention or tenderness.  Skin: Mild DTI over sacrum resolved.   Neuro: AAO to person, place, time and event. Mild left inattention - resolved; L facial droop - mild. Impulsive, needs reminders to slow down/focus on current task.  MSK:  LUE: 5-/5 grip, 5-/5 finger abduction; otherwise 5/5 thoroughout LLE: 5/5 throughout RUE and RLE: 5/5 throughout    Assessment/Plan: 1. Functional deficits which require 3+ hours per day of interdisciplinary therapy in a comprehensive inpatient rehab setting. Physiatrist is providing close team supervision and 24 hour management  of active medical problems listed below. Physiatrist and rehab team continue to assess barriers to discharge/monitor patient progress toward functional and medical goals  Care Tool:  Bathing    Body parts bathed by patient: Right arm, Left arm, Chest, Abdomen, Front perineal area, Buttocks, Right upper leg, Left upper leg, Face, Right lower leg, Left lower leg   Body parts bathed by helper: Right lower leg, Left lower leg     Bathing assist Assist Level: Supervision/Verbal cueing     Upper Body Dressing/Undressing Upper body dressing   What is the patient wearing?: Pull over shirt    Upper body assist Assist Level: Supervision/Verbal cueing    Lower Body Dressing/Undressing Lower body dressing      What is the patient wearing?: Underwear/pull up, Pants     Lower body assist Assist for lower body dressing: Supervision/Verbal cueing     Toileting Toileting    Toileting assist Assist for toileting: Contact Guard/Touching assist     Transfers Chair/bed transfer  Transfers assist     Chair/bed transfer assist level: Supervision/Verbal cueing     Locomotion Ambulation   Ambulation assist      Assist level: Supervision/Verbal cueing Assistive device: No Device Max distance: 200'   Walk 10 feet activity   Assist     Assist level: Supervision/Verbal cueing Assistive device: No Device   Walk 50 feet activity  Assist    Assist level: Supervision/Verbal cueing Assistive device: No Device    Walk 150 feet activity   Assist    Assist level: Supervision/Verbal cueing Assistive device: No Device    Walk 10 feet on uneven surface  activity   Assist     Assist level: Supervision/Verbal cueing Assistive device:  (R handrail)   Wheelchair     Assist Is the patient using a wheelchair?: No Type of Wheelchair: Manual    Wheelchair assist level: Total Assistance - Patient < 25% Max wheelchair distance: 150'    Wheelchair 50 feet  with 2 turns activity    Assist        Assist Level: Total Assistance - Patient < 25%   Wheelchair 150 feet activity     Assist      Assist Level: Total Assistance - Patient < 25%   Blood pressure (!) 104/59, pulse 68, temperature 98 F (36.7 C), resp. rate 16, height 5' (1.524 m), weight 42.7 kg, SpO2 94 %.  Medical Problem List and Plan: 1. Functional deficits secondary to R MCA stroke with L sensory deficits and L hemiparesis as well as dysphagia and L inattention             -patient may  shower             -ELOS/Goals: 10-14 days- supervision with PT, OT, SLP  -Stable for discharge to home  2.  Antithrombotics: -DVT/anticoagulation:  Pharmaceutical: Lovenox             -antiplatelet therapy: ASA. Per DC, "She has been started on only aspirin 81 mg p.o. daily instead of DAPT due to previous history of large ICH/hemorrhagic stroke." Can address with Neuro at OP f/u.   3. DDD Cervical spine/Pain Management: tylenol prn.   4. Mood/Behavior/Sleep:  LCSW to follow for evaluation and support.              - Hx  ADHD: On Adderall daily.              - Hx Bipolar disorder: Stable on home regimen of Lamictal, Seroquel and buspar for anxiety  - ?mania component w/ impulsivity; per family somewhat baseline but behaviors have worsened s/p strokes              -antipsychotic agents:  Continue              - neuropsych consult next week if appropriate - performed 10/4, no new recs            - 9/30: Fall OOB, noted impulsive behaviors and poor safety awareness, however no further tele available in hospital; nursing to place belt alarm in bed             - 10/4 DC scheduled atarax d/t dry mouth, not home med. PRN available and not used.    5. Neuropsych/cognition: This patient is capable of making decisions on her own behalf. 6. Skin/Wound Care: Routine pressure relief measures.           - Sacral DTI - Sacral heart, change PRN  7. Fluids/Electrolytes/Nutrition: Monitor I/O.  Check CMET in am.    Latest Ref Rng & Units 01/05/2022    6:39 AM 12/29/2021    6:55 AM 12/26/2021    6:31 AM  BMP  Glucose 70 - 99 mg/dL 87  87  86   BUN 8 - 23 mg/dL 17  13  13    Creatinine 0.44 - 1.00 mg/dL  0.52  0.62  0.61   Sodium 135 - 145 mmol/L 143  140  140   Potassium 3.5 - 5.1 mmol/L 4.3  3.8  4.2   Chloride 98 - 111 mmol/L 108  105  105   CO2 22 - 32 mmol/L 26  28  30    Calcium 8.9 - 10.3 mg/dL 9.4  8.8  8.9   10/9 - labs WNL, no changes  8. HTN: Monitor BP TID, well controlled    01/09/2022    5:16 AM 01/08/2022    7:28 PM 01/08/2022    2:26 PM  Vitals with BMI  Systolic 832 919 96  Diastolic 59 63 57  Pulse 68 71 62  - 10/4 IVF overnight for asymptomatic hypotension 70/40; improved this AM, DC maintenance fluids.   - 10/8 well controlled, continue to monitor  9. CAD: Monitor for symptoms with increase in activity. Continue ASA, Lipitor. Denies chest pain 10. 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  --Stable on Breztri PTA and husband provided from home.  - Provided packet on tobacco cessation education by nursing 10/10; pt will pursue medical assistance for quitting at discharge  11. Chronic tremors: Managed with inderal and Lamictal--worse in am and gets better as day progresses.  12.  Perforated gastric ulcer s/p repair 07/2021: On PPI 13. OSA: Nocturnal oxygen in the  past. Will order prn.          - 10/3 pt refusing CPAP as no longer needs OP s/p weight loss. DC  14. Dysphagia/Cough: on D2 nectar thick liquids. Educated patient and husband on aspiration risk and need for nectar liquids.              --flutter valve added.             --check CXR - 9/29 no acute cardiopulmonary process  - Upgraded to DYS 3 diet 9/29             - 10/10 Barium swallow; no aspiration but some deep penetration; cleared for D3 thin liquids with 5 cc Provale cup - doing well  15. Blurred vision - added PRN eye drops. -  resolved              - Encouraged work with therapies - OT notified 16. Urinary urgency              - Start ditropan 2.5 mg TID 10/4 - much improved              - PVR Qshift for 2 days to ensure no retention with new medication - 1x scan >200; continue to monitor               - 10/9 no further high scans; continue current regimen                - 10/12 - increase ditropan to 5 mg TID; Q8H bladder scans  - no retention  17. Rhinorrhea - improved -Will start flonase 10/8 -> PRN 10/12  LOS: 15 days A FACE TO [place] 01/09/2022, 10:57 AM

## 2022-01-09 NOTE — Progress Notes (Signed)
Inpatient Rehabilitation Care Coordinator Discharge Note   Patient Details  Name: Lauren [name] MRN: [id number] Date of Birth: 1950/08/29   Discharge location: D/c to home  Length of Stay: 15 days  Discharge activity level: Limited  Home/community participation: Supervision  Patient response OI:NOMVEH Literacy - How often do you need to have someone help you when you read instructions, pamphlets, or other written material from your doctor or pharmacy?: Never  Patient response MC:NOBSJG Isolation - How often do you feel lonely or isolated from those around you?: Never  Services provided included: MD, RD, OT, CM, Pharmacy, Neuropsych, SW, TR, RN, SLP, PT  Financial Services:  Financial Services Utilized: [place] offered to/list presented to: Yes  Follow-up services arranged:  Outpatient    Outpatient Servicies: [facility] location for PT/OT/SLP   Patient response to transportation need: Is the patient able to respond to transportation needs?: Yes In the past 12 months, has lack of transportation kept you from medical appointments or from getting medications?: No In the past 12 months, has lack of transportation kept you from meetings, work, or from getting things needed for daily living?: No    Comments (or additional information):  Patient/Family verbalized understanding of follow-up arrangements:  Yes  Individual responsible for coordination of the follow-up plan: contact pt husband Shanon Brow  Confirmed correct DME delivered: Rana Snare 01/09/2022    Rana Snare

## 2022-01-09 NOTE — Progress Notes (Signed)
Inpatient Rehabilitation Discharge Medication Review by a Pharmacist  A complete drug regimen review was completed for this patient to identify any potential clinically significant medication issues.  High Risk Drug Classes Is patient taking? Indication by Medication  Antipsychotic Yes Quetiapine for bioplar 1  Anticoagulant No   Antibiotic No   Opioid No   Antiplatelet Yes Aspirin and clopidogrel for CVA  Hypoglycemics/insulin No   Vasoactive Medication No   Chemotherapy No   Other Yes Amphetamine-dextroamphetamine for ADHD Atorvastatin for HLD Breztri inhaler for COPD Buspirone for anxiety Calcium and multivitamin supplement Fluticasone NS for allergies/rhinorrhea Lamotrigine for bipolar 1  Oxybutynin for urinary urgency  Pantoprazole for hx perforated GI ulcer  Propranolol ER for tremor Senna-doc for constipation Trazodone for sleep     Type of Medication Issue Identified Description of Issue Recommendation(s)  Drug Interaction(s) (clinically significant)     Duplicate Therapy     Allergy     No Medication Administration End Date     Incorrect Dose     Additional Drug Therapy Needed     Significant med changes from prior encounter (inform family/care partners about these prior to discharge). New clopidogrel, oxybutynin, pantoprazole, fluticasone NS, naphazoline-glycerine eye drops, senna-doc Stop aspirin on 10/19 Stop hydroxyzine and Norco  Team to educate patient   Other       Clinically significant medication issues were identified that warrant physician communication and completion of prescribed/recommended actions by midnight of the next day:  No  Name of provider notified for urgent issues identified:   Provider Method of Notification:     Pharmacist comments:   Time spent performing this drug regimen review (minutes):  Dawn, PharmD, BCPS, [facility] Clinical Pharmacist  Please check AMION for all [place] phone numbers After 10:00 PM,  call [place]

## 2022-01-13 ENCOUNTER — Other Ambulatory Visit (HOSPITAL_COMMUNITY): Payer: Self-pay

## 2022-01-19 ENCOUNTER — Encounter: Payer: Self-pay | Admitting: Physical Medicine and Rehabilitation

## 2022-01-19 ENCOUNTER — Encounter
Payer: Federal, State, Local not specified - PPO | Attending: Physical Medicine and Rehabilitation | Admitting: Physical Medicine and Rehabilitation

## 2022-01-19 VITALS — BP 107/73 | HR 68 | Ht 60.0 in | Wt 98.0 lb

## 2022-01-19 DIAGNOSIS — N3941 Urge incontinence: Secondary | ICD-10-CM | POA: Diagnosis present

## 2022-01-19 DIAGNOSIS — Z8659 Personal history of other mental and behavioral disorders: Secondary | ICD-10-CM | POA: Diagnosis present

## 2022-01-19 DIAGNOSIS — Z8673 Personal history of transient ischemic attack (TIA), and cerebral infarction without residual deficits: Secondary | ICD-10-CM | POA: Insufficient documentation

## 2022-01-19 DIAGNOSIS — R1310 Dysphagia, unspecified: Secondary | ICD-10-CM | POA: Insufficient documentation

## 2022-01-19 NOTE — Progress Notes (Signed)
Subjective:    Patient ID: Lauren [name], female    DOB: September 10, 1950, 71 y.o.   MRN: [id number]  HPI Brief HPI:   Lauren [name] is a 71 y.o. 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 She presents with her husband, who provides interval Hx.  Briefly, she was admitted on 12/20/2021 after falling out of bed with hemiplegic left facial droop, dysarthria and right gaze deviation.  CT head was positive for right MCA hyperdense sign with emergent LVO noted with pseudonormalization. MRI brain showed right MCA stroke with edema and she was treated with hypertonic saline.  She was started on aspirin with recommendations to add Plavix at time of discharge. CIR was recommended due to functional decline. Rehab course was c/b hypotension (IVF), dysphagia ( MBS improved, D3 diet, provale cup), urinary urgency (low-dose Ditropan 3 times daily), and impulsivity/inattention. On DC, she was supervision to [facility] for ADLs, supervision with transfers and to ambulate 200' without AD, and min A cognition for verbal cues to complete functional and familiar tasks.    Interval Hx:  - Has had 1 near fall since return home, but overall has no DME needs and feels safe in home. Husband notes she will often forget or refuse to use her walker for safety.   - Has been taking Quetiapine and Trazodone for sleep, still having trouble sleeping with frequent waking up. She has Hx bipolar and is re-establishing with psychiatry in the next few weeks.   - Pt's starting to get neck pain. She used to do ablations for neck pain, which were helpful, every year to 6 months. It has been since April-May since she last had them done, but she notes it was only one side.   - Pt's bladder urgency has gotten much worse despite ditropan and has been  cramping/hurting every 30 minutes. She has been having intermittent incontinence, and believes she is also retaining. Pt states she had bladder problems as a kid but they self-resolved, she did see a Urologist then but not as an adult.   - Therapies starting at the end of this week with SLP; OT and PT to start later.   - She is going to start PCP at [facility], earliest she can get in is December 7th. They couldn't get her in earlier, but asked that they call every morning for possible cancellations. Also offered initial follow up with PA, which they declines.   - She quit cigarettes 3 days ago, not using anything to help. Doing well.  - Eating low salt diet with minimal added sugar; daughter cooked for them and has stored healthy foods in their home since dischage.  -  Has been taking BP and on average has been in the low 100s; 1x check was 201/85, she felt woozy/wobbly at that time, but has not recurred.   - Continues to endorse memory issues, forgetting adaptive strategies such as writing things down or concentrating for conversations. Some was pre-existing but has been significantly worse since her 2 strokes.  - Vision improving, seeing opthamologist soon.   - Has noted new R hand numbness, intermittent since stroke  Pain Inventory Average Pain 0 Pain Right Now 0 My pain is intermittent and numbness  LOCATION OF PAIN  right hand fingers numbness  BOWEL Number of stools  per week: 7 or more Oral laxative use Yes  Type of laxative Senokot   BLADDER Normal Bladder incontinence Yes  Frequent urination Yes    Mobility walk with assistance use a walker how many minutes can you walk? 2 minutes with a walker ability to climb steps?  yes do you drive?  no use a wheelchair needs help with transfers Do you have any goals in this area?  yes  Function retired I need assistance with the following:  bathing, toileting, meal prep, household duties, and  shopping Do you have any goals in this area?  yes  Neuro/Psych bladder control problems weakness numbness tremor trouble walking confusion depression anxiety loss of taste or smell  Prior Studies Any changes since last visit?  no  Physicians involved in your care Any changes since last visit?  no   Family History  Problem Relation Age of Onset   Diabetes Mother    Tremor Sister    Diabetes Brother    Social History   Socioeconomic History   Marital status: Married    Spouse name: Not on file   Number of children: Not on file   Years of education: Not on file   Highest education level: Not on file  Occupational History   Not on file  Tobacco Use   Smoking status: Former    Types: Cigarettes    Quit date: 01/19/2022   Smokeless tobacco: Never  Vaping Use   Vaping Use: Former  Substance and Sexual Activity   Alcohol use: Not Currently   Drug use: Not Currently   Sexual activity: Not Currently  Other Topics Concern   Not on file  Social History Narrative   Not on file   Social Determinants of Health   Financial Resource Strain: Not on file  Food Insecurity: Not on file  Transportation Needs: Not on file  Physical Activity: Not on file  Stress: Not on file  Social Connections: Not on file   Past Surgical History:  Procedure Laterality Date   BREAST LUMPECTOMY     CORONARY ANGIOPLASTY WITH STENT PLACEMENT     GASTRIC BYPASS  2016   KNEE ARTHROSCOPY     REPAIR OF PERFORATED ULCER  07/2021   Past Medical History:  Diagnosis Date   Anxiety    Bipolar 1 disorder ([place])    Bipolar disorder ([place])    Breast cancer ([place])    Breast cancer ([place])    Bronchitis    CAD (coronary artery disease)    DDD (degenerative disc disease), cervical    Diabetes mellitus without complication ([place])    Emphysema lung ([place])    Hypertension    ICH (intracerebral hemorrhage) ([facility])    Lung cancer (H[facility])    Occasional tremors    Parotid mass    PUD (peptic ulcer  disease)    BP 107/73   Pulse 68   Ht 5' (1.524 m)   Wt 98 lb (44.5 kg)   SpO2 96%   BMI 19.14 kg/m   Opioid Risk Score:   Fall Risk Score:  `1  Depression screen [facility] 2/9     01/19/2022    3:00 PM  Depression screen [facility] 2/9  Decreased Interest 3  Down, Depressed, Hopeless 3  PHQ - 2 Score 6  Altered sleeping 1  Tired, decreased energy 3  Change in appetite 0  Feeling bad or failure about yourself  3  Trouble concentrating 3  Moving slowly or fidgety/restless 3  Suicidal thoughts  0  PHQ-9 Score 19    Review of Systems  Constitutional:        Loss of taste  Eyes:  Negative for visual disturbance.  Genitourinary:  Positive for frequency and urgency.       Incontinence   Musculoskeletal:  Positive for gait problem.  Neurological:  Positive for weakness and numbness.       Tremor  Psychiatric/Behavioral:  Positive for confusion.        Anxiety, depression  All other systems reviewed and are negative.      Objective:   Physical Exam  Constitutional: No apparent distress. Appears older than stated age.  HENT: No JVD. Neck Supple. Trachea midline. Atraumatic, normocephalic. +glasses Eyes: PERRLA. EOMI. Visual fields grossly intact.  Cardiovascular: RRR, no murmurs/rub/gallops. No Edema. Peripheral pulses 2+  Respiratory: CTAB. No rales, rhonchi, or wheezing. On RA. Good air movement.   Abdomen: + bowel sounds, normoactive. No distention or tenderness.  Skin: C/D/I.  Neuro: AAO to person, place, time and event. + L facial droop  No apparent ataxia on FTN, HtS b/l. Reflexes 1+ throughout.   Psych: Pressured, fast speech. Impulsive (ex. Goes to restroom without help, AD even with reminder to use). Anxious mood.  MSK: 5/5 throughout BL UE and LE.      Assessment & Plan:   Lauren [name] is a 71 y.o. 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 She presents with her husband, who provides interval Hx. Residual deficits include L facial droop/dysarthria, dysphagia, impulsivity, memory deficits, and gait instability.  History of bipolar disorder Assessment & Plan: Discussed connection between mood, behavior, concentration and memory.  Patient re-establishing with psych services, defer medication management to them for bipolar, anxiety, insomnia.  Did discuss possible referral to neuropsychology, however will hold off until f/u given need for stable bipolar regimen.    Dysphagia, unspecified type Assessment & Plan: Doing well with oral intakes  About to start SLP OP   Urge urinary incontinence Assessment & Plan: Continue Ditropan 5 mg BID for now  Chronic, worsening issue. No fevers, chills, pain or dysuria to indicate UTI.  Referral sent to Urology to assist in medication management and possible urodynamic studies.   Orders: -     Ambulatory referral to Urology  H/O ischemic right MCA stroke Assessment & Plan: PT, OT starting soon OP  Doing well with modifiable risk factors reduction, such as quitting smoking and dietary changes to address pre-DM and sodium intake.   Follow up with Neurology pending. Did confirm with patient to continue Plavix only, not ASA at this time.  Follow up in 3 months for mobility, weakness, and R hand numbness       Gertie Gowda, DO 01/20/2022

## 2022-01-19 NOTE — Patient Instructions (Signed)
Continue following with physical therapy, occupational therapy, and speech therapy as an outpatient.   You will be re-establishing with psychiatry and a family doctor soon. Please go to these appointments.   You have been referred to urology for urinary urgency.   Please call the office if you need any medication refills before your PCP appointment in December.  Follow up in 3 months. At that time, we can discuss right hand numbness if it is continuing, as well as a neuropsychology referral if thought processes are still a major challenge.  You can see me sooner if needed.   Great job reducing your smoking and eating better!

## 2022-01-20 DIAGNOSIS — Z8673 Personal history of transient ischemic attack (TIA), and cerebral infarction without residual deficits: Secondary | ICD-10-CM | POA: Insufficient documentation

## 2022-01-20 NOTE — Assessment & Plan Note (Signed)
Continue Ditropan 5 mg BID for now  Chronic, worsening issue. No fevers, chills, pain or dysuria to indicate UTI.  Referral sent to Urology to assist in medication management and possible urodynamic studies.

## 2022-01-20 NOTE — Assessment & Plan Note (Signed)
Doing well with oral intakes  About to start SLP OP

## 2022-01-20 NOTE — Assessment & Plan Note (Signed)
Discussed connection between mood, behavior, concentration and memory.  Patient re-establishing with psych services, defer medication management to them for bipolar, anxiety, insomnia.  Did discuss possible referral to neuropsychology, however will hold off until f/u given need for stable bipolar regimen.

## 2022-01-20 NOTE — Assessment & Plan Note (Signed)
PT, OT starting soon OP  Doing well with modifiable risk factors reduction, such as quitting smoking and dietary changes to address pre-DM and sodium intake.   Follow up with Neurology pending. Did confirm with patient to continue Plavix only, not ASA at this time.  Follow up in 3 months for mobility, weakness, and R hand numbness

## 2022-01-21 ENCOUNTER — Encounter: Payer: Federal, State, Local not specified - PPO | Admitting: Physical Medicine and Rehabilitation

## 2022-01-28 ENCOUNTER — Ambulatory Visit (INDEPENDENT_AMBULATORY_CARE_PROVIDER_SITE_OTHER): Payer: Medicare Other | Admitting: Physician Assistant

## 2022-01-28 VITALS — BP 145/77 | HR 54 | Wt 92.0 lb

## 2022-01-28 DIAGNOSIS — N3941 Urge incontinence: Secondary | ICD-10-CM | POA: Diagnosis not present

## 2022-01-28 DIAGNOSIS — R339 Retention of urine, unspecified: Secondary | ICD-10-CM | POA: Diagnosis not present

## 2022-01-28 DIAGNOSIS — R350 Frequency of micturition: Secondary | ICD-10-CM

## 2022-01-28 LAB — URINALYSIS, ROUTINE W REFLEX MICROSCOPIC
Bilirubin, UA: NEGATIVE
Glucose, UA: NEGATIVE
Ketones, UA: NEGATIVE
Leukocytes,UA: NEGATIVE
Nitrite, UA: NEGATIVE
Protein,UA: NEGATIVE
RBC, UA: NEGATIVE
Specific Gravity, UA: 1.01 (ref 1.005–1.030)
Urobilinogen, Ur: 0.2 mg/dL (ref 0.2–1.0)
pH, UA: 6.5 (ref 5.0–7.5)

## 2022-01-28 LAB — BLADDER SCAN AMB NON-IMAGING: Scan Result: 223

## 2022-01-28 NOTE — Progress Notes (Signed)
01/28/2022 9:45 AM   Lauren [name] 05-Apr-1950 [id number]   Assessment:  1. Urine frequency - BLADDER SCAN AMB NON-IMAGING - Urinalysis, Routine w reflex microscopic  2. Incomplete bladder emptying  3. Urge urinary incontinence    Plan: Pt will stop Ditropan and FU for UA and PVR in 1-2 weeks.  No new prescriptions at this time.  The patient's case is discussed with Dr. Alyson [name] who agrees with current plan.  Discussed CVA effects on the bladder and that it may take up to a year for max improvement.  If the patient's symptoms continue to worsen, rather than improve, she will follow-up sooner.   Referring provider: Gertie Gowda, DO [address] [place],  [place] [address]   History of Present Illness:  Lauren [name] is a 71 y.o. year old female with h/o ischemic CVA dx on 12/25/21 who is seen in consultation from Lauren Moan, MD  for evaluation of UUI. Pt started on Ditropan 5mg  BID 10/13 during admission for CVA dx due to urgency and incontinence. No recent UA or urine cxs noted in Epic or Care Everywhere. Pt continues with therapy with improvement in speech, ambulation, and facial droop. Currently she feels her urinary status is overall worsening since starting the Ditropan with increase in frequency, urgency, incontinence, hesitancy and weakness of stream.  No hematuria.  She has started wearing depends as of yesterday because her incontinence seems to have worsened.  She denies history of retention in the past has no history of stone disease and reports no history of recent urinary tract infections.  Patient admits to significant constipation over the past 2 weeks and has started Senokot S for relief.  Creatinine 0.52 on 01/05/2022 UA= clear PVR=237ml   DC Summary on 01/19/22 [name] is a 70 y.o. 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 She presents with her husband, who provides interval Hx. Residual deficits include L facial droop/dysarthria, dysphagia, impulsivity, memory deficits, and gait instability.    Portions of the above documentation were copied from a prior visit for review purposes only. Medical records including notes, lab results, and imaging studies reviewed during pt OV.  Past Medical History:  Past Medical History:  Diagnosis Date   Anxiety    Bipolar 1 disorder (Lauren [name])    Bipolar disorder (Lauren [name])    Breast cancer (Lauren [name])    Breast cancer (Lauren [name])    Bronchitis    CAD (coronary artery disease)    DDD (degenerative disc disease), cervical    Diabetes mellitus without complication (Lauren [name])    Emphysema lung (Lauren [name])    Hypertension    ICH (intracerebral hemorrhage) (Lauren [name])    Lung cancer (Lauren [name])    Occasional tremors    Parotid mass    PUD (peptic ulcer disease)     Past Surgical History:  Past Surgical History:  Procedure Laterality Date   BREAST LUMPECTOMY     CORONARY ANGIOPLASTY WITH STENT PLACEMENT     GASTRIC BYPASS  2016   KNEE ARTHROSCOPY     REPAIR OF PERFORATED ULCER  07/2021    Allergies:  Allergies  Allergen Reactions   Latex Rash   Prozac [Fluoxetine] Rash    Family History:  Family History  Problem Relation Age of Onset   Diabetes Mother    Tremor Sister  Diabetes Brother     Social History:  Social History   Tobacco Use   Smoking status: Former    Types: Cigarettes    Quit date: 01/19/2022    Years since quitting: 0.0   Smokeless tobacco: Never  Vaping Use   Vaping Use: Former  Substance Use Topics   Alcohol use: Not Currently   Drug use: Not Currently    Review of symptoms:  Constitutional:  Negative for unexplained weight loss, night sweats, fever, chills ENT:  Negative for nose bleeds, sinus pain, painful swallowing CV:  Negative for chest pain GI:  Negative for nausea, vomiting,  diarrhea, bloody stools GU:  Positives noted in HPI Neuro:  Negative for seizures Psych:  Negative for anxiety Endocrine:  Negative for polydipsia, polyuria, symptoms of hypoglycemia (dizziness, hunger, sweating) Hematologic:  Negative for anemia, purpura, petechia, prolonged or excessive bleeding  Physical Exam: BP (!) 145/77   Pulse (!) 54   Wt 92 lb (41.7 kg)   BMI 17.97 kg/m   Constitutional:  Alert and oriented, No acute distress. Pt is thin with mild very mild speech delay noted. HEENT: NCAT, moist mucus membranes.  Trachea midline, no masses. Cardiovascular: Regular rate and rhythm without murmur, rub, or gallops No clubbing, cyanosis, or edema. Respiratory: Normal respiratory effort, clear to auscultation bilaterally GI: Abdomen is soft, mild suprapubic tenderness, nondistended, no abdominal masses BACK:  Non-tender to palpation.  No CVAT Skin: No obvious rashes, warm, dry, intact Neurologic: Alert and oriented, ambulates independently with cautious gait Psychiatric: Appropriate. Normal mood and affect.  Laboratory Data: No results found for this or any previous visit (from the past 24 hour(s)).  Lab Results  Component Value Date   WBC 6.9 01/05/2022   HGB 13.6 01/05/2022   HCT 40.7 01/05/2022   MCV 87.9 01/05/2022   PLT 460 (H) 01/05/2022    Lab Results  Component Value Date   CREATININE 0.52 01/05/2022    No results found for: "PSA"  No results found for: "TESTOSTERONE"  Lab Results  Component Value Date   HGBA1C 5.8 (H) 12/20/2021    Urinalysis    Component Value Date/Time   COLORURINE YELLOW 03/26/2010 [place] 03/26/2010 1139   LABSPEC 1.015 03/26/2010 1139   PHURINE 5.5 03/26/2010 1139   GLUCOSEU NEGATIVE 03/26/2010 1139   HGBUR TRACE (A) 03/26/2010 1139   BILIRUBINUR NEGATIVE 03/26/2010 1139   KETONESUR NEGATIVE 03/26/2010 1139   PROTEINUR NEGATIVE 03/26/2010 1139   UROBILINOGEN 0.2 03/26/2010 1139   NITRITE NEGATIVE  03/26/2010 1139   LEUKOCYTESUR SMALL (A) 03/26/2010 1139    No results found for: "LABMICR", "WBCUA", "RBCUA", "LABEPIT", "MUCUS", "BACTERIA"  Pertinent Imaging: No results found for this or any previous visit.  No results found for this or any previous visit.     [place], Berneice Heinrich, PA-C [facility]

## 2022-01-28 NOTE — Progress Notes (Signed)
scan9:51 AM  post void residual =223

## 2022-01-28 NOTE — Patient Instructions (Signed)
STOP Oxybutynin

## 2022-02-05 ENCOUNTER — Ambulatory Visit (INDEPENDENT_AMBULATORY_CARE_PROVIDER_SITE_OTHER): Payer: Medicare Other | Admitting: Urology

## 2022-02-05 ENCOUNTER — Ambulatory Visit: Payer: TRICARE For Life (TFL) | Admitting: Physician Assistant

## 2022-02-05 ENCOUNTER — Telehealth: Payer: Self-pay

## 2022-02-05 DIAGNOSIS — R350 Frequency of micturition: Secondary | ICD-10-CM

## 2022-02-05 LAB — BLADDER SCAN AMB NON-IMAGING: Scan Result: 260

## 2022-02-05 NOTE — Telephone Encounter (Signed)
Discussed with patient's husband, they are looking for pre-op clearance for cataract surgery yet to be scheduled. She is meeting her new PCP 11/21; advised they call PCP office ahead of time and inform them of this, so they can obtain appropaite tests.

## 2022-02-05 NOTE — Progress Notes (Signed)
post void residual =223mL  Patient able to void after PVR, 28ml output measured in urine hat.  Consulted Dr. Jeffie Pollock on next steps and to review u/a.  Verbal orders from MD to f/u with [name] next week, no concerns with urine and PVR this visit.  Patient scheduled with PA on 02/12/2022.  Patient instructed to call office if symptoms worsen before next apt and we will move her apt up to a sooner date.

## 2022-02-06 LAB — MICROSCOPIC EXAMINATION
Bacteria, UA: NONE SEEN
Epithelial Cells (non renal): NONE SEEN /hpf (ref 0–10)

## 2022-02-06 LAB — URINALYSIS, ROUTINE W REFLEX MICROSCOPIC
Bilirubin, UA: NEGATIVE
Glucose, UA: NEGATIVE
Ketones, UA: NEGATIVE
Leukocytes,UA: NEGATIVE
Nitrite, UA: NEGATIVE
Protein,UA: NEGATIVE
Specific Gravity, UA: 1.005 (ref 1.005–1.030)
Urobilinogen, Ur: 0.2 mg/dL (ref 0.2–1.0)
pH, UA: 5.5 (ref 5.0–7.5)

## 2022-02-09 ENCOUNTER — Other Ambulatory Visit: Payer: Self-pay

## 2022-02-09 MED ORDER — CLOPIDOGREL BISULFATE 75 MG PO TABS: 75.0000 mg | ORAL_TABLET | Freq: Every day | ORAL | 0 refills | Status: AC

## 2022-02-09 NOTE — Telephone Encounter (Signed)
Spouse informed of Dr. Francina Ames reply.

## 2022-02-09 NOTE — Telephone Encounter (Signed)
Per Lauren [name] the patient is out of Plavix.

## 2022-02-11 ENCOUNTER — Ambulatory Visit: Payer: TRICARE For Life (TFL) | Admitting: Physician Assistant

## 2022-02-12 ENCOUNTER — Ambulatory Visit (INDEPENDENT_AMBULATORY_CARE_PROVIDER_SITE_OTHER): Payer: Medicare Other | Admitting: Physician Assistant

## 2022-02-12 VITALS — BP 152/85 | HR 64

## 2022-02-12 DIAGNOSIS — R350 Frequency of micturition: Secondary | ICD-10-CM

## 2022-02-12 LAB — BLADDER SCAN AMB NON-IMAGING: Scan Result: 0

## 2022-02-12 NOTE — Progress Notes (Signed)
Assessment: 1. Urine frequency - Urinalysis, Routine w reflex microscopic    Plan: The patient continues to do extremely well with her recovery.  Continue current urinary care. No new meds. FU in 6 months for UA and PVR, sooner if she has concerns     Chief Complaint: No chief complaint on file.   HPI: Lauren [name] is a 71 y.o. female who presents for continued evaluation of urinary frequency.  Patient held Ditropan since last visit and reports she continues to improve every day.  Physical therapy is going extremely well.  No urinary complaints today.  Constipation has improved.  Follow-up with oncology scheduled next week for lung mass.  UA=clear PVR=69ml  01/28/2022 Lauren [name] is a 71 y.o. year old female with h/o ischemic CVA dx on 12/25/21 who is seen in consultation from Malachy Moan, MD  for evaluation of UUI. Pt started on Ditropan 5mg  BID 10/13 during admission for CVA dx due to urgency and incontinence. No recent UA or urine cxs noted in Epic or Care Everywhere. Pt continues with therapy with improvement in speech, ambulation, and facial droop. Currently she feels her urinary status is overall worsening since starting the Ditropan with increase in frequency, urgency, incontinence, hesitancy and weakness of stream.  No hematuria.  She has started wearing depends as of yesterday because her incontinence seems to have worsened.  She denies history of retention in the past has no history of stone disease and reports no history of recent urinary tract infections.  Patient admits to significant constipation over the past 2 weeks and has started Senokot S for relief.   Creatinine 0.52 on 01/05/2022 UA= clear PVR=280ml     DC Summary on 01/19/22 Lauren [name] is a 71 y.o. 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 She presents with her husband, who provides interval Hx. Residual deficits include L facial droop/dysarthria, dysphagia, impulsivity, memory deficits, and gait instability.  Portions of the above documentation were copied from a prior visit for review purposes only.  Allergies: Allergies  Allergen Reactions   Latex Rash   Prozac [Fluoxetine] Rash    PMH: Past Medical History:  Diagnosis Date   Anxiety    Bipolar 1 disorder ([place])    Bipolar disorder ([place])    Breast cancer ([place])    Breast cancer ([place])    Bronchitis    CAD (coronary artery disease)    DDD (degenerative disc disease), cervical    Diabetes mellitus without complication ([place])    Emphysema lung ([place])    Hypertension    ICH (intracerebral hemorrhage) ([facility])    Lung cancer ([facility])    Occasional tremors    Parotid mass    PUD (peptic ulcer disease)     PSH: Past Surgical History:  Procedure Laterality Date   BREAST LUMPECTOMY     CORONARY ANGIOPLASTY WITH STENT PLACEMENT     GASTRIC BYPASS  2016   KNEE ARTHROSCOPY     REPAIR OF PERFORATED ULCER  07/2021    SH: Social History   Tobacco Use   Smoking status: Former    Types: Cigarettes    Quit date: 01/19/2022    Years since quitting: 0.0   Smokeless tobacco: Never  Vaping Use   Vaping Use: Former  Substance Use Topics   Alcohol use: Not Currently  Drug use: Not Currently    ROS: All other review of systems were reviewed and are negative except what is noted above in HPI  PE: BP (!) 152/85   Pulse 64  GENERAL APPEARANCE:  Well appearing, well developed, thin, no apparent distress HEENT:  Atraumatic, normocephalic NECK:  Supple. Trachea midline ABDOMEN:  Soft, non-tender, no masses EXTREMITIES:  Moves all extremities well, without clubbing, cyanosis, or edema NEUROLOGIC:  Alert and oriented x 3 MENTAL STATUS:  appropriate BACK:  Non-tender to palpation, No CVAT SKIN:  Warm, dry, and  intact   Results: Laboratory Data: Lab Results  Component Value Date   WBC 6.9 01/05/2022   HGB 13.6 01/05/2022   HCT 40.7 01/05/2022   MCV 87.9 01/05/2022   PLT 460 (H) 01/05/2022    Lab Results  Component Value Date   CREATININE 0.52 01/05/2022      Lab Results  Component Value Date   HGBA1C 5.8 (H) 12/20/2021    Urinalysis    Component Value Date/Time   COLORURINE YELLOW 03/26/2010 1139   APPEARANCEUR Clear 02/05/2022 1056   LABSPEC 1.015 03/26/2010 1139   PHURINE 5.5 03/26/2010 1139   GLUCOSEU Negative 02/05/2022 1056   HGBUR TRACE (A) 03/26/2010 1139   BILIRUBINUR Negative 02/05/2022 1056   KETONESUR NEGATIVE 03/26/2010 1139   PROTEINUR Negative 02/05/2022 1056   PROTEINUR NEGATIVE 03/26/2010 1139   UROBILINOGEN 0.2 03/26/2010 1139   NITRITE Negative 02/05/2022 1056   NITRITE NEGATIVE 03/26/2010 1139   LEUKOCYTESUR Negative 02/05/2022 1056    Lab Results  Component Value Date   LABMICR See below: 02/05/2022   WBCUA 0-5 02/05/2022   LABEPIT None seen 02/05/2022   BACTERIA None seen 02/05/2022    Pertinent Imaging: No results found for this or any previous visit.  No results found for this or any previous visit.  No results found for this or any previous visit.  No results found for this or any previous visit.  No results found for this or any previous visit.  No valid procedures specified. No results found for this or any previous visit.  No results found for this or any previous visit.  No results found for this or any previous visit (from the past 24 hour(s)).

## 2022-02-13 LAB — URINALYSIS, ROUTINE W REFLEX MICROSCOPIC
Bilirubin, UA: NEGATIVE
Glucose, UA: NEGATIVE
Ketones, UA: NEGATIVE
Leukocytes,UA: NEGATIVE
Nitrite, UA: NEGATIVE
Protein,UA: NEGATIVE
RBC, UA: NEGATIVE
Specific Gravity, UA: 1.025 (ref 1.005–1.030)
Urobilinogen, Ur: 1 mg/dL (ref 0.2–1.0)
pH, UA: 6.5 (ref 5.0–7.5)

## 2022-03-03 ENCOUNTER — Inpatient Hospital Stay: Payer: Medicare Other | Admitting: Diagnostic Neuroimaging

## 2022-04-15 ENCOUNTER — Ambulatory Visit (INDEPENDENT_AMBULATORY_CARE_PROVIDER_SITE_OTHER): Payer: Medicare Other | Admitting: Diagnostic Neuroimaging

## 2022-04-15 ENCOUNTER — Encounter: Payer: Self-pay | Admitting: Diagnostic Neuroimaging

## 2022-04-15 VITALS — BP 124/53 | HR 75 | Ht 63.0 in | Wt 100.0 lb

## 2022-04-15 DIAGNOSIS — Z8673 Personal history of transient ischemic attack (TIA), and cerebral infarction without residual deficits: Secondary | ICD-10-CM

## 2022-04-15 DIAGNOSIS — G250 Essential tremor: Secondary | ICD-10-CM

## 2022-04-15 NOTE — Progress Notes (Signed)
[facility]  PATIENT: Lauren [name] DOB: [birth year]  REFERRING CLINICIAN: Bary Leriche, PA-C HISTORY FROM: patient REASON FOR VISIT: new consult    HISTORICAL  CHIEF COMPLAINT:  Chief Complaint  Patient presents with   New Patient (Initial Visit)    Patient in room #6 with her husband. Patient here today to discuss CVA, TOC for tremors.    HISTORY OF PRESENT ILLNESS:   72 year old female with hypertension, diabetes, tobacco abuse, coronary disease, hyperlipidemia, here for stroke follow-up.  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  Stroke workup was completed.  Patient continues to smoke cigarettes but is trying to cut down.  Tolerating medications.  Stroke symptoms have essentially resolved.  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  Sister has similar tremor.  Has been diagnosed with essential tremor.  Has been on propranolol with mild benefit.   REVIEW OF SYSTEMS: Full 14 system review of systems performed and negative with exception of: as per HPI.  ALLERGIES: Allergies  Allergen Reactions   Latex Rash   Prozac [Fluoxetine] Rash    HOME MEDICATIONS: Outpatient Medications Prior to Visit  Medication Sig Dispense Refill   acetaminophen (TYLENOL) 325 MG tablet Take 1-2 tablets (325-650 mg total) by mouth every 4 (four) hours as needed for mild pain. 100 tablet 0   atorvastatin (LIPITOR) 40 MG tablet Take 1 tablet (40 mg total) by mouth daily. 30 tablet 0   Budeson-Glycopyrrol-Formoterol (BREZTRI AEROSPHERE) 160-9-4.8 MCG/ACT AERO Inhale 2 puffs into the lungs 2 (two) times daily.     busPIRone (BUSPAR) 15 MG tablet Take 2 tablets (30 mg total) by mouth 3 (three) times daily. 180 tablet 0   CALCIUM PO Take 1 tablet by mouth daily.     clopidogrel (PLAVIX) 75 MG tablet Take 1 tablet (75 mg total) by mouth daily. 30  tablet 0   fluticasone (FLONASE) 50 MCG/ACT nasal spray Place 1 spray into both nostrils daily as needed for allergies or rhinitis. 16 g 2   lamoTRIgine (LAMICTAL) 100 MG tablet Take 2 tablets (200 mg total) by mouth daily. 60 tablet 0   Multiple Vitamin (MULTIVITAMIN) tablet Take 1 tablet by mouth daily.     naphazoline-glycerin (CLEAR EYES REDNESS) 0.012-0.25 % SOLN Place 1-2 drops into both eyes 4 (four) times daily as needed for eye irritation (dry eyes, blurred vision).     pantoprazole (PROTONIX) 40 MG tablet Take 1 tablet (40 mg total) by mouth daily. 30 tablet 0   propranolol ER (INDERAL LA) 60 MG 24 hr capsule Take 1 capsule (60 mg total) by mouth daily. 30 capsule 0   QUEtiapine (SEROQUEL) 100 MG tablet Take 200 mg by mouth at bedtime.     traZODone (DESYREL) 50 MG tablet Take 1 tablet (50 mg total) by mouth at bedtime. 30 tablet 0   amphetamine-dextroamphetamine (ADDERALL) 20 MG tablet Take 20 mg by mouth every morning. (Patient not taking: Reported on 04/15/2022)     senna-docusate (SENOKOT-S) 8.6-50 MG tablet Take 2 tablets by mouth at bedtime as needed for mild constipation. (Patient not taking: Reported on 04/15/2022) 60 tablet 0   No facility-administered medications prior to visit.    PAST MEDICAL HISTORY: Past Medical History:  Diagnosis Date   Anxiety    Bipolar 1 disorder ([place])    Bipolar disorder ([place])    Breast cancer ([place])    Breast cancer (  HCC)    Bronchitis    CAD (coronary artery disease)    DDD (degenerative disc disease), cervical    Diabetes mellitus without complication (HCC)    Emphysema lung (HCC)    Hypertension    ICH (intracerebral hemorrhage) (HCC)    Lung cancer (HCC)    Occasional tremors    Parotid mass    PUD (peptic ulcer disease)     PAST SURGICAL HISTORY: Past Surgical History:  Procedure Laterality Date   BREAST LUMPECTOMY     CORONARY ANGIOPLASTY WITH STENT PLACEMENT     GASTRIC BYPASS  2016   KNEE ARTHROSCOPY     REPAIR OF  PERFORATED ULCER  07/2021    FAMILY HISTORY: Family History  Problem Relation Age of Onset   Diabetes Mother    Tremor Sister    Diabetes Brother     SOCIAL HISTORY: Social History   Socioeconomic History   Marital status: Married    Spouse name: Not on file   Number of children: Not on file   Years of education: Not on file   Highest education level: Not on file  Occupational History   Not on file  Tobacco Use   Smoking status: Former    Types: Cigarettes    Quit date: 01/19/2022    Years since quitting: 0.2   Smokeless tobacco: Never  Vaping Use   Vaping Use: Former  Substance and Sexual Activity   Alcohol use: Not Currently   Drug use: Not Currently   Sexual activity: Not Currently  Other Topics Concern   Not on file  Social History Narrative   Not on file   Social Determinants of Health   Financial Resource Strain: Not on file  Food Insecurity: Not on file  Transportation Needs: Not on file  Physical Activity: Not on file  Stress: Not on file  Social Connections: Not on file  Intimate Partner Violence: Not on file     PHYSICAL EXAM  GENERAL EXAM/CONSTITUTIONAL: Vitals:  Vitals:   04/15/22 1038  BP: (!) 124/53  Pulse: 75  Weight: 100 lb (45.4 kg)  Height: 5\' 3"  (1.6 m)   Body mass index is 17.71 kg/m. Wt Readings from Last 3 Encounters:  04/15/22 100 lb (45.4 kg)  01/28/22 92 lb (41.7 kg)  01/19/22 98 lb (44.5 kg)   Patient is in no distress; well developed, nourished and groomed; neck is supple STRONG SMELL OF CIGARETTE SMOKE; FRAIL APPEARING  CARDIOVASCULAR: Examination of carotid arteries is normal; no carotid bruits Regular rate and rhythm, no murmurs Examination of peripheral vascular system by observation and palpation is normal  EYES: Ophthalmoscopic exam of optic discs and posterior segments is normal; no papilledema or hemorrhages No results found.  MUSCULOSKELETAL: Gait, strength, tone, movements noted in Neurologic exam  below  NEUROLOGIC: MENTAL STATUS:      No data to display         awake, alert, oriented to person, place and time recent and remote memory intact normal attention and concentration language fluent, comprehension intact, naming intact fund of knowledge appropriate  CRANIAL NERVE:  2nd - no papilledema on fundoscopic exam 2nd, 3rd, 4th, 6th - pupils equal and reactive to light, visual fields full to confrontation, extraocular muscles intact, no nystagmus 5th - facial sensation symmetric 7th - facial strength symmetric 8th - hearing intact 9th - palate elevates symmetrically, uvula midline 11th - shoulder shrug symmetric 12th - tongue protrusion midline  MOTOR:  normal bulk and tone, full strength  in the BUE, BLE MILD POSTURAL TREMOR IN HEAD AND HANDS  SENSORY:  normal and symmetric to light touch, temperature, vibration  COORDINATION:  finger-nose-finger, fine finger movements; SLOW ON LEFT ; RIGHT ARM ORBITS AROUND LEFT  REFLEXES:  deep tendon reflexes TRACE and symmetric  GAIT/STATION:  narrow based gait     DIAGNOSTIC DATA (LABS, IMAGING, TESTING) - I reviewed patient records, labs, notes, testing and imaging myself where available.  Lab Results  Component Value Date   WBC 6.9 01/05/2022   HGB 13.6 01/05/2022   HCT 40.7 01/05/2022   MCV 87.9 01/05/2022   PLT 460 (H) 01/05/2022      Component Value Date/Time   NA 143 01/05/2022 0639   K 4.3 01/05/2022 0639   CL 108 01/05/2022 0639   CO2 26 01/05/2022 0639   GLUCOSE 87 01/05/2022 0639   BUN 17 01/05/2022 0639   CREATININE 0.52 01/05/2022 0639   CALCIUM 9.4 01/05/2022 0639   PROT 6.0 (L) 12/26/2021 0631   ALBUMIN 2.8 (L) 12/26/2021 0631   AST 14 (L) 12/26/2021 0631   ALT 14 12/26/2021 0631   ALKPHOS 73 12/26/2021 0631   BILITOT 0.4 12/26/2021 0631   GFRNONAA >60 01/05/2022 0639   GFRAA  03/26/2010 1139    >60        The eGFR has been calculated using the MDRD equation. This calculation has  not been validated in all clinical situations. eGFR's persistently <60 mL/min signify possible Chronic Kidney Disease.   Lab Results  Component Value Date   CHOL 151 12/21/2021   HDL 30 (L) 12/21/2021   LDLCALC 103 (H) 12/21/2021   TRIG 88 12/21/2021   CHOLHDL 5.0 12/21/2021   Lab Results  Component Value Date   HGBA1C 5.8 (H) 12/20/2021   No results found for: "VITAMINB12" No results found for: "TSH"  12/21/2021 MRI brain with: 1. No pathologic enhancement to suggest metastatic disease to the brain. 2. Cortical thickening and edema in the anterior right frontal lobe and operculum consistent with the known infarct.  12/21/21 MRI brain (without)  1. Confluent moderate sized right MCA infarct. Trace petechial hemorrhage in the right caudate. No other hemorrhagic transformation. No significant intracranial mass effect. 2. Chronic confluent hemosiderin with encephalomalacia in the posterolateral left superior frontal gyrus is new from the MRI last year.  Narrative & Impression  CLINICAL DATA:  Code stroke. 72 year old female. Known indolent left upper lobe lung carcinoma.   EXAM: CT ANGIOGRAPHY HEAD AND NECK   CT PERFUSION BRAIN   TECHNIQUE: Multidetector CT imaging of the head and neck was performed using the standard protocol during bolus administration of intravenous contrast. Multiplanar CT image reconstructions and MIPs were obtained to evaluate the vascular anatomy. Carotid stenosis measurements (when applicable) are obtained utilizing NASCET criteria, using the distal internal carotid diameter as the denominator.   Multiphase CT imaging of the brain was performed following IV bolus contrast injection. Subsequent parametric perfusion maps were calculated using RAPID software.   RADIATION DOSE REDUCTION: This exam was performed according to the departmental dose-optimization program which includes automated exposure control, adjustment of the mA and/or kV  according to patient size and/or use of iterative reconstruction technique.   CONTRAST:  35mL OMNIPAQUE IOHEXOL 350 MG/ML SOLN   COMPARISON:  Plain head CT 0805 hours today   Chest CT 07/30/2021.   FINDINGS: CT Brain Perfusion Findings:   ASPECTS: 5   CBF (<30%) Volume: 0 by the standard criteria, and no CBV alterations, suggesting subacute cytotoxic  edema on the plain head CT.   Perfusion (Tmax>6.0s) volume: 61mL by the standard criteria, up to 54 mL using T-max greater than 4 S.   Mismatch Volume: Difficult to determine considering pseudonormalization of CBF and CBV. Suspect little salvageable penumbra given this constellation.   Infarction Location:Right MCA   CTA NECK   Skeleton: Largely absent dentition. Mild mandible motion artifact. Mild cervical spine degeneration for age. No acute osseous abnormality identified.   Upper chest: Emphysema and apical lung scarring. Known left upper lobe lung cancer is 12 mm on series 5, image 151, stable since 07/30/2021.   Other neck: No acute finding.   Aortic arch: Calcified aortic atherosclerosis. Entire arch not included.   Right carotid system: Visible brachiocephalic artery with only mild plaque. Negative right CCA origin. Mild right CCA calcified plaque without stenosis. Right ICA origin calcified plaque with up to 64 % stenosis with respect to the distal vessel. Additional right ICA bulb calcified plaque with lesser stenosis.   Left carotid system: Left CCA origin not included. Mild left CCA plaque proximal to the bifurcation. Bulky calcified plaque at the left ICA origin. 50% stenosis at the left ICA origin. Less pronounced calcified plaque at the left ICA bulb.   Vertebral arteries: Proximal right subclavian artery plaque without stenosis. Minimal plaque at the right vertebral artery origin without stenosis. Right vertebral artery is mildly dominant and patent to the skull base without stenosis.   Bulky  proximal left subclavian artery plaque with up to 72 % stenosis with respect to the distal vessel series 5, image 167. Plaque near the left vertebral artery origin but only mild origin stenosis. Left vertebral artery is mildly non dominant with additional V1 and V2 calcified plaque but remains patent to the skull base with no significant stenosis.   CTA HEAD   Posterior circulation: Mild bilateral distal vertebral artery calcified plaque without stenosis. Patent PICA origins and vertebrobasilar junction. Patent basilar artery without stenosis. Patent SCA and PCA origins. Posterior communicating arteries are diminutive or absent. Bilateral PCA branches are within normal limits.   Anterior circulation: Both ICA siphons are patent with calcified plaque. Only mild bilateral siphon stenosis. Patent carotid termini, MCA and ACA origins. Anterior communicating artery and bilateral ACA branches are within normal limits. Left MCA M1 segment and bifurcation are patent without stenosis. Left MCA branches are within normal limits.   Right MCA origin is patent. The right M1 is occluded on series 12, image 23 about 10 mm from its origin proximal to the bifurcation. However, the bifurcation and MCA branches are moderately reconstituted as seen on series 11, image 16.   Venous sinuses: Grossly patent.   Anatomic variants: Mildly dominant right vertebral artery.   Review of the MIP images confirms the above findings   Salient findings discussed by telephone with Dr. Caryl Pina on 12/20/2021 at 0824 hours.   12/20/21 CTA head / neck 1. Positive for Emergent Large Vessel Occlusion of the Right MCA mid M1 segment, but pseudo-normalization of the Right MCA infarct on both CBF and CBV. Combined with ASPECTS of 5 and moderately reconstituted appearance of the right MCA branches this constellation does not seem favorable for endovascular reperfusion. These findings discussed by telephone with Dr.  Caryl Pina on 12/20/2021 at 0824 hours.   2. Known left upper lobe indolent lung cancer appears stable since chest CT 07/30/2021, 12 mm. Emphysema (ICD10-J43.9).   3. Aortic Atherosclerosis (ICD10-I70.0) and atherosclerosis in the neck and at the skull base. Significant  stenoses: - Right ICA origin, 64%. - proximal Left Subclavian Artery, 72%    12/20/21 TTE   1. Left ventricular ejection fraction, by estimation, is 50 to 55%. The  left ventricle has low normal function. The left ventricle has no regional  wall motion abnormalities. There is mild left ventricular hypertrophy.  Left ventricular diastolic  parameters are consistent with Grade I diastolic dysfunction (impaired  relaxation).   2. Right ventricular systolic function is normal. The right ventricular  size is normal. There is normal pulmonary artery systolic pressure. The  estimated right ventricular systolic pressure is 27.6 mmHg.   3. The mitral valve is normal in structure. No evidence of mitral valve  regurgitation. No evidence of mitral stenosis.   4. The aortic valve was not well visualized. Aortic valve regurgitation  is not visualized. No aortic stenosis is present.   5. The inferior vena cava is normal in size with greater than 50%  respiratory variability, suggesting right atrial pressure of 3 mmHg.    ASSESSMENT AND PLAN  72 y.o. year old female here with:   Dx:  1. H/O ischemic right MCA stroke   2. Essential tremor     PLAN:   STROKE PREVENTION - continue clopidogrel, atorvastatin, propranolol - SMOKING CESSATION ENCOURAGED  TREMOR (essential tremor; since ~2001) - continue propranolol; could switch to another beta blocker or other anti-HTN medication per PCP, since it does not seem to help tremors that much  Return for return to PCP.    Suanne Marker, MD 04/16/2022, 5:56 PM Certified in Neurology, Neurophysiology and Neuroimaging  [facility] [address]  [address] [place], [place] [address] [phone]

## 2022-04-15 NOTE — Patient Instructions (Signed)
  STROKE PREVENTION - continue clopidogrel, atorvastatin, propranolol - SMOKING CESSATION ENCOURAGED  TREMOR (essential tremor; since ~2001) - continue propranolol; could switch to another beta blocker or other anti-HTN medication per PCP, since it does not seem to help tremors that much

## 2022-04-16 ENCOUNTER — Other Ambulatory Visit (HOSPITAL_COMMUNITY): Payer: Self-pay

## 2022-04-20 ENCOUNTER — Ambulatory Visit: Payer: TRICARE For Life (TFL) | Admitting: Physical Medicine and Rehabilitation

## 2022-04-22 ENCOUNTER — Encounter: Payer: Self-pay | Admitting: Physical Medicine and Rehabilitation

## 2022-04-22 ENCOUNTER — Encounter
Payer: Federal, State, Local not specified - PPO | Attending: Physical Medicine and Rehabilitation | Admitting: Physical Medicine and Rehabilitation

## 2022-04-22 VITALS — BP 111/73 | HR 82 | Ht 63.0 in | Wt 102.0 lb

## 2022-04-22 DIAGNOSIS — N3941 Urge incontinence: Secondary | ICD-10-CM | POA: Diagnosis present

## 2022-04-22 DIAGNOSIS — Z8673 Personal history of transient ischemic attack (TIA), and cerebral infarction without residual deficits: Secondary | ICD-10-CM | POA: Diagnosis present

## 2022-04-22 DIAGNOSIS — H539 Unspecified visual disturbance: Secondary | ICD-10-CM | POA: Insufficient documentation

## 2022-04-22 NOTE — Assessment & Plan Note (Addendum)
Hemiparesis, gait stability, and cognitive function back to baseline per therapy discharges and per patient. No further rehabilitation needs at this time.   Follow up with me as needed!

## 2022-04-22 NOTE — Assessment & Plan Note (Signed)
Much improved with DC ditropan, following with urology  No further changes at this time

## 2022-04-22 NOTE — Patient Instructions (Signed)
Today, we discussed return to driving. The main limitation based on your exam at this point is vision, so I would not recommend return to driving before your cataract surgery.   I also would strongly recommend a driver's evaluation to ensure safe return to driving after your surgery. I have provided information on a independent service for this, and also provided a medical form for a DMV evaluation. I would recommend you bring the DMV form to your ophthalmologist on post-cataract surgery follow up for them to fill out and submit. Be aware that the [facility] evaluation is mandatory once forms are submitted, and has the ability to remove your license if deemed unsafe. The best place locally to get this evaluation completed is the [facility].  Follow up with me as needed!

## 2022-04-22 NOTE — Assessment & Plan Note (Signed)
?  R eye LLQ field cut on exam; resolves with binocular vision. No other abnormalities on EOMI.   The main limitation to return to driving  at this point is vision, so I would not recommend return to driving before your cataract surgery.   Strongly recommend a driver's evaluation to ensure safe return to driving after your surgery. I have provided information on a independent service for this, and also provided a medical form for a DMV evaluation. I would recommend you bring the DMV form to your ophthalmologist on post-cataract surgery follow up for them to fill out and submit. Be aware that the [facility] evaluation is mandatory once forms are submitted, and has the ability to remove your license if deemed unsafe. The best place locally to get this evaluation completed is the [facility].

## 2022-04-22 NOTE — Progress Notes (Signed)
Subjective:    Patient ID: Lauren [name], female    DOB: 29-May-1950, 72 y.o.   MRN: [id number]  HPI  Lauren [name] is a 72 y.o. 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 She presents with her husband, who provides interval Hx. Residual deficits include L facial droop/dysarthria, dysphagia, impulsivity, memory deficits, and gait instability.   Plan from last visit: History of bipolar disorder Assessment & Plan: Discussed connection between mood, behavior, concentration and memory.   Patient re-establishing with psych services, defer medication management to them for bipolar, anxiety, insomnia.   Did discuss possible referral to neuropsychology, however will hold off until f/u given need for stable bipolar regimen.      Dysphagia, unspecified type Assessment & Plan: Doing well with oral intakes   About to start SLP OP     Urge urinary incontinence Assessment & Plan: Continue Ditropan 5 mg BID for now   Chronic, worsening issue. No fevers, chills, pain or dysuria to indicate UTI.   Referral sent to Urology to assist in medication management and possible urodynamic studies.    Orders: -     Ambulatory referral to Urology   H/O ischemic right MCA stroke Assessment & Plan: PT, OT starting soon OP   Doing well with modifiable risk factors reduction, such as quitting smoking and dietary changes to address pre-DM and sodium intake.    Follow up with Neurology pending. Did confirm with patient to continue Plavix only, not ASA at this time.   Follow up in 3 months for mobility, weakness, and R hand numbness  Interval Hx:  - f/u with Neurology, stroke symptoms resolved, f/u PRN and recommended PPX and smoking cessation - Graduated SLP d/t goals being met, return to baseline cognitive  functioning. Patient agrees.  - Urology evaluated, [facility] ditropan, patient doing well. She says she doesn't trust her bladder, but is only getting up about once at night to pee.  - Patient states she has a cold right now - Patient states "everything looks good" functionally; no concerns. She says she has to remind herself to slow down to keep balance.  - Smoking 1 ppd; joined a quit smoking group and feels it is not helpful. She has tried all assistive medications. Is interested in accupuncture for smoking cessation. Her psychiatrist is also helping. - Is getting cataract surgery soon for vision deficits - Wishes to return to driving. Husband concerned about reaction speed on left.   Pain Inventory Average Pain 0 Pain Right Now 0 My pain is  No Pain   LOCATION OF PAIN  No Pain  BOWEL Number of stools per week: 8-9 Oral laxative use Yes  Type of laxative Miralax  Normal    Mobility walk without assistance how many minutes can you walk? 5  Function retired  Neuro/Psych No problems in this area  Prior Studies Any changes since last visit?  no  Physicians involved in your care Any changes since last visit?  no   Family History  Problem Relation Age of Onset   Diabetes Mother    Tremor Sister    Diabetes Brother    Social History   Socioeconomic History   Marital status: Married    Spouse name: Not on file   Number of children: Not on file   Years of education:  Not on file   Highest education level: Not on file  Occupational History   Not on file  Tobacco Use   Smoking status: Former    Types: Cigarettes    Quit date: 01/19/2022    Years since quitting: 0.2   Smokeless tobacco: Never  Vaping Use   Vaping Use: Former  Substance and Sexual Activity   Alcohol use: Not Currently   Drug use: Not Currently   Sexual activity: Not Currently  Other Topics Concern   Not on file  Social History Narrative   Not on file   Social Determinants of Health    Financial Resource Strain: Not on file  Food Insecurity: Not on file  Transportation Needs: Not on file  Physical Activity: Not on file  Stress: Not on file  Social Connections: Not on file   Past Surgical History:  Procedure Laterality Date   BREAST LUMPECTOMY     CORONARY ANGIOPLASTY WITH STENT PLACEMENT     GASTRIC BYPASS  2016   KNEE ARTHROSCOPY     REPAIR OF PERFORATED ULCER  07/2021   Past Medical History:  Diagnosis Date   Anxiety    Bipolar 1 disorder ([place])    Bipolar disorder ([place])    Breast cancer ([place])    Breast cancer ([place])    Bronchitis    CAD (coronary artery disease)    DDD (degenerative disc disease), cervical    Diabetes mellitus without complication ([place])    Emphysema lung ([place])    Hypertension    ICH (intracerebral hemorrhage) ([place])    Lung cancer ([place])    Occasional tremors    Parotid mass    PUD (peptic ulcer disease)    There were no vitals taken for this visit.  Opioid Risk Score:   Fall Risk Score:  `1  Depression screen [facility] 2/9     01/19/2022    3:00 PM  Depression screen PHQ 2/9  Decreased Interest 3  Down, Depressed, Hopeless 3  PHQ - 2 Score 6  Altered sleeping 1  Tired, decreased energy 3  Change in appetite 0  Feeling bad or failure about yourself  3  Trouble concentrating 3  Moving slowly or fidgety/restless 3  Suicidal thoughts 0  PHQ-9 Score 19      Review of Systems  Eyes:  Positive for visual disturbance.  Respiratory:  Negative for shortness of breath.   Genitourinary:  Positive for frequency and urgency.  Neurological:  Negative for weakness and numbness.  All other systems reviewed and are negative.      Objective:   Physical Exam  Constitution: Appropriate appearance for age. No apparent distress. +Underweight HEENT: PERRL, EOMI grossly intact. ? R eye LLQ cut, resolves with binocular vision. Resp: CTAB. No rales, rhonchi, or wheezing. Cardio: RRR. No mumurs, rubs, or gallops.  No peripheral  edema. Abdomen: Nondistended. Nontender. +bowel sounds. Psych: Appropriate mood, mildly pressured speech, baseline.   Neurologic Exam:   DTRs: Reflexes were 2+ in bilateral UE, LEs. Sensory exam: revealed normal sensation in all dermatomal regions in bilateral  UE and LEs. Motor exam: strength normal in all myotomal regions of bilateral UE 5/5 and LE 5/5. Coordination: Mild UE intention tremor; no ataxia, fine motor WNL Gait: Gait was normal.       Assessment & Plan:   Sacheen Arrasmith is a 72 y.o. 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 She presents with her husband, who provides interval Hx. Residual deficits mostly resolved, maintains some impulsivity, memory deficits, and vision deficits.     H/O ischemic right MCA stroke Assessment & Plan: Hemiparesis, gait stability, and cognitive function back to baseline per therapy discharges and per patient. No further rehabilitation needs at this time.   Follow up with me as needed!   Urge urinary incontinence Assessment & Plan: Much improved with DC ditropan, following with urology  No further changes at this time   Vision changes Assessment & Plan: ? R eye LLQ field cut on exam; resolves with binocular vision. No other abnormalities on EOMI.   The main limitation to return to driving  at this point is vision, so I would not recommend return to driving before your cataract surgery.   Strongly recommend a driver's evaluation to ensure safe return to driving after your surgery. I have provided information on a independent service for this, and also provided a medical form for a DMV evaluation. I would recommend you bring the DMV form to your ophthalmologist on post-cataract surgery follow up for them to fill out and submit. Be aware that the  [facility] evaluation is mandatory once forms are submitted, and has the ability to remove your license if deemed unsafe. The best place locally to get this evaluation completed is the [facility]      Gertie Gowda, DO 04/22/2022

## 2022-06-30 ENCOUNTER — Encounter: Payer: Self-pay | Admitting: Diagnostic Neuroimaging

## 2022-07-27 ENCOUNTER — Telehealth: Payer: Self-pay

## 2022-07-27 NOTE — Telephone Encounter (Signed)
Mr. Ragin called to confirm if Puja Caffey needed to stop the Propranolol? Her new PCP is new to her care. And he wanted your input.  Call back [phone].

## 2022-08-04 ENCOUNTER — Ambulatory Visit: Payer: Federal, State, Local not specified - PPO | Admitting: Diagnostic Neuroimaging

## 2022-08-10 ENCOUNTER — Ambulatory Visit (INDEPENDENT_AMBULATORY_CARE_PROVIDER_SITE_OTHER): Payer: Medicare Other | Admitting: Urology

## 2022-08-10 ENCOUNTER — Encounter: Payer: Self-pay | Admitting: Urology

## 2022-08-10 VITALS — BP 107/64 | HR 74

## 2022-08-10 DIAGNOSIS — N3941 Urge incontinence: Secondary | ICD-10-CM | POA: Diagnosis not present

## 2022-08-10 DIAGNOSIS — R350 Frequency of micturition: Secondary | ICD-10-CM | POA: Diagnosis not present

## 2022-08-10 LAB — URINALYSIS, ROUTINE W REFLEX MICROSCOPIC
Bilirubin, UA: NEGATIVE
Glucose, UA: NEGATIVE
Ketones, UA: NEGATIVE
Leukocytes,UA: NEGATIVE
Nitrite, UA: NEGATIVE
Protein,UA: NEGATIVE
RBC, UA: NEGATIVE
Specific Gravity, UA: 1.01 (ref 1.005–1.030)
Urobilinogen, Ur: 0.2 mg/dL (ref 0.2–1.0)
pH, UA: 5.5 (ref 5.0–7.5)

## 2022-08-10 MED ORDER — MIRABEGRON ER 25 MG PO TB24: 25.0000 mg | ORAL_TABLET | Freq: Every day | ORAL | 0 refills | Status: AC

## 2022-08-10 NOTE — Patient Instructions (Signed)

## 2022-08-10 NOTE — Progress Notes (Unsigned)
08/10/2022 11:45 AM   Lauren [name] 05-19-1950 [id number]  Referring provider: Raquel James, MD [address] [place],  [place] [address]  Followup OAB   HPI: Ms Alday is a 72yo here for followup for OAB. She was previously on ditropan which failed to improve her urinary frequency. She had worsening incontinence after her CVA in 11/2021 which was improved with diptropan. She has urinary frequency every 20-30 minutes. No dysuria or hematuria. She uses 2 pads per day.    PMH: Past Medical History:  Diagnosis Date   Anxiety    Bipolar 1 disorder (HCC)    Bipolar disorder (HCC)    Breast cancer (HCC)    Breast cancer (HCC)    Bronchitis    CAD (coronary artery disease)    DDD (degenerative disc disease), cervical    Diabetes mellitus without complication (HCC)    Emphysema lung (HCC)    Hypertension    ICH (intracerebral hemorrhage) (HCC)    Lung cancer (HCC)    Occasional tremors    Parotid mass    PUD (peptic ulcer disease)     Surgical History: Past Surgical History:  Procedure Laterality Date   BREAST LUMPECTOMY     CORONARY ANGIOPLASTY WITH STENT PLACEMENT     GASTRIC BYPASS  2016   KNEE ARTHROSCOPY     REPAIR OF PERFORATED ULCER  07/2021    Home Medications:  Allergies as of 08/10/2022       Reactions   Latex Rash   Prozac [fluoxetine] Rash        Medication List        Accurate as of Aug 10, 2022 11:45 AM. If you have any questions, ask your nurse or doctor.          acetaminophen 325 MG tablet Commonly known as: TYLENOL Take 1-2 tablets (325-650 mg total) by mouth every 4 (four) hours as needed for mild pain.   amphetamine-dextroamphetamine 20 MG tablet Commonly known as: ADDERALL Take 20 mg by mouth every morning.   atorvastatin 40 MG tablet Commonly known as: LIPITOR Take 1 tablet (40 mg total) by mouth daily.   Breztri Aerosphere 160-9-4.8 MCG/ACT Aero Generic drug: Budeson-Glycopyrrol-Formoterol Inhale 2  puffs into the lungs 2 (two) times daily.   busPIRone 15 MG tablet Commonly known as: BUSPAR Take 2 tablets (30 mg total) by mouth 3 (three) times daily.   CALCIUM PO Take 1 tablet by mouth daily.   clopidogrel 75 MG tablet Commonly known as: PLAVIX Take 1 tablet (75 mg total) by mouth daily.   fluticasone 50 MCG/ACT nasal spray Commonly known as: FLONASE Place 1 spray into both nostrils daily as needed for allergies or rhinitis.   lamoTRIgine 100 MG tablet Commonly known as: LAMICTAL Take 2 tablets (200 mg total) by mouth daily.   multivitamin tablet Take 1 tablet by mouth daily.   naphazoline-glycerin 0.012-0.25 % Soln Commonly known as: CLEAR EYES REDNESS Place 1-2 drops into both eyes 4 (four) times daily as needed for eye irritation (dry eyes, blurred vision).   pantoprazole 40 MG tablet Commonly known as: PROTONIX Take 1 tablet (40 mg total) by mouth daily.   propranolol ER 60 MG 24 hr capsule Commonly known as: INDERAL LA Take 1 capsule (60 mg total) by mouth daily.   QUEtiapine 100 MG tablet Commonly known as: SEROQUEL Take 200 mg by mouth at bedtime.   Senexon-S 8.6-50 MG tablet Generic drug: senna-docusate Take 2 tablets by mouth at bedtime as  needed for mild constipation.   traZODone 50 MG tablet Commonly known as: DESYREL Take 1 tablet (50 mg total) by mouth at bedtime.        Allergies:  Allergies  Allergen Reactions   Latex Rash   Prozac [Fluoxetine] Rash    Family History: Family History  Problem Relation Age of Onset   Diabetes Mother    Tremor Sister    Diabetes Brother     Social History:  reports that she quit smoking about 6 months ago. Her smoking use included cigarettes. She has never used smokeless tobacco. She reports that she does not currently use alcohol. She reports that she does not currently use drugs.  ROS: All other review of systems were reviewed and are negative except what is noted above in HPI  Physical  Exam: BP 107/64   Pulse 74   Constitutional:  Alert and oriented, No acute distress. HEENT: [place] AT, moist mucus membranes.  Trachea midline, no masses. Cardiovascular: No clubbing, cyanosis, or edema. Respiratory: Normal respiratory effort, no increased work of breathing. GI: Abdomen is soft, nontender, nondistended, no abdominal masses GU: No CVA tenderness.  Lymph: No cervical or inguinal lymphadenopathy. Skin: No rashes, bruises or suspicious lesions. Neurologic: Grossly intact, no focal deficits, moving all 4 extremities. Psychiatric: Normal mood and affect.  Laboratory Data: Lab Results  Component Value Date   WBC 6.9 01/05/2022   HGB 13.6 01/05/2022   HCT 40.7 01/05/2022   MCV 87.9 01/05/2022   PLT 460 (H) 01/05/2022    Lab Results  Component Value Date   CREATININE 0.52 01/05/2022    No results found for: "PSA"  No results found for: "TESTOSTERONE"  Lab Results  Component Value Date   HGBA1C 5.8 (H) 12/20/2021    Urinalysis    Component Value Date/Time   COLORURINE YELLOW 03/26/2010 1139   APPEARANCEUR Clear 02/12/2022 1444   LABSPEC 1.015 03/26/2010 1139   PHURINE 5.5 03/26/2010 1139   GLUCOSEU Negative 02/12/2022 1444   HGBUR TRACE (A) 03/26/2010 1139   BILIRUBINUR Negative 02/12/2022 1444   KETONESUR NEGATIVE 03/26/2010 1139   PROTEINUR Negative 02/12/2022 1444   PROTEINUR NEGATIVE 03/26/2010 1139   UROBILINOGEN 0.2 03/26/2010 1139   NITRITE Negative 02/12/2022 1444   NITRITE NEGATIVE 03/26/2010 1139   LEUKOCYTESUR Negative 02/12/2022 1444    Lab Results  Component Value Date   LABMICR Comment 02/12/2022   WBCUA 0-5 02/05/2022   LABEPIT None seen 02/05/2022   BACTERIA None seen 02/05/2022    Pertinent Imaging:  No results found for this or any previous visit.  No results found for this or any previous visit.  No results found for this or any previous visit.  No results found for this or any previous visit.  No results found for this  or any previous visit.  No valid procedures specified. No results found for this or any previous visit.  No results found for this or any previous visit.   Assessment & Plan:    1. Urine frequency -we will trial mirabegron 25mg  daily - Urinalysis, Routine w reflex microscopic  2. Urge urinary incontinence -mirabegron 25mg  daily.    No follow-ups on file.  Wilkie Aye, MD  [facility] [place]

## 2022-09-25 ENCOUNTER — Telehealth: Payer: Federal, State, Local not specified - PPO | Admitting: Urology
